# Patient Record
Sex: Male | Born: 1955 | Race: White | Hispanic: No | Smoking: Current every day smoker
Health system: Southern US, Community
[De-identification: ages and names within clinical notes are randomized; demographics above are authoritative.]

## PROBLEM LIST (undated history)

## (undated) DIAGNOSIS — M545 Low back pain, unspecified: Secondary | ICD-10-CM

## (undated) DIAGNOSIS — I10 Essential (primary) hypertension: Secondary | ICD-10-CM

## (undated) DIAGNOSIS — D126 Benign neoplasm of colon, unspecified: Secondary | ICD-10-CM

## (undated) DIAGNOSIS — I219 Acute myocardial infarction, unspecified: Secondary | ICD-10-CM

## (undated) DIAGNOSIS — Z720 Tobacco use: Secondary | ICD-10-CM

## (undated) DIAGNOSIS — D649 Anemia, unspecified: Secondary | ICD-10-CM

## (undated) DIAGNOSIS — E785 Hyperlipidemia, unspecified: Secondary | ICD-10-CM

## (undated) DIAGNOSIS — I519 Heart disease, unspecified: Secondary | ICD-10-CM

## (undated) DIAGNOSIS — M1990 Unspecified osteoarthritis, unspecified site: Secondary | ICD-10-CM

## (undated) DIAGNOSIS — I2699 Other pulmonary embolism without acute cor pulmonale: Secondary | ICD-10-CM

## (undated) DIAGNOSIS — E669 Obesity, unspecified: Secondary | ICD-10-CM

## (undated) DIAGNOSIS — I2510 Atherosclerotic heart disease of native coronary artery without angina pectoris: Secondary | ICD-10-CM

## (undated) DIAGNOSIS — I639 Cerebral infarction, unspecified: Secondary | ICD-10-CM

## (undated) HISTORY — PX: CORONARY STENT PLACEMENT: SHX1402

## (undated) HISTORY — DX: Atherosclerotic heart disease of native coronary artery without angina pectoris: I25.10

## (undated) HISTORY — DX: Hyperlipidemia, unspecified: E78.5

## (undated) HISTORY — DX: Other pulmonary embolism without acute cor pulmonale: I26.99

## (undated) HISTORY — DX: Anemia, unspecified: D64.9

## (undated) HISTORY — PX: CHOLECYSTECTOMY: SHX55

## (undated) HISTORY — DX: Acute myocardial infarction, unspecified: I21.9

## (undated) HISTORY — DX: Heart disease, unspecified: I51.9

## (undated) HISTORY — DX: Low back pain: M54.5

## (undated) HISTORY — PX: HERNIA REPAIR: SHX51

## (undated) HISTORY — DX: Essential (primary) hypertension: I10

## (undated) HISTORY — PX: TONSILLECTOMY: SUR1361

## (undated) HISTORY — DX: Obesity, unspecified: E66.9

## (undated) HISTORY — PX: OTHER SURGICAL HISTORY: SHX169

## (undated) HISTORY — DX: Unspecified osteoarthritis, unspecified site: M19.90

## (undated) HISTORY — PX: GASTRIC BYPASS: SHX52

## (undated) HISTORY — DX: Cerebral infarction, unspecified: I63.9

## (undated) HISTORY — DX: Low back pain, unspecified: M54.50

## (undated) HISTORY — PX: CORONARY ARTERY BYPASS GRAFT: SHX141

## (undated) HISTORY — DX: Tobacco use: Z72.0

---
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
## 1999-09-11 ENCOUNTER — Inpatient Hospital Stay (HOSPITAL_COMMUNITY): Admission: EM | Admit: 1999-09-11 | Discharge: 1999-09-16 | Payer: Self-pay | Admitting: Cardiology

## 1999-10-28 ENCOUNTER — Ambulatory Visit (HOSPITAL_COMMUNITY): Admission: RE | Admit: 1999-10-28 | Discharge: 1999-10-28 | Payer: Self-pay | Admitting: Internal Medicine

## 2000-12-18 ENCOUNTER — Inpatient Hospital Stay (HOSPITAL_COMMUNITY): Admission: AD | Admit: 2000-12-18 | Discharge: 2000-12-21 | Payer: Self-pay | Admitting: Cardiology

## 2004-02-21 ENCOUNTER — Inpatient Hospital Stay (HOSPITAL_COMMUNITY): Admission: EM | Admit: 2004-02-21 | Discharge: 2004-03-08 | Payer: Self-pay | Admitting: Cardiology

## 2004-07-13 ENCOUNTER — Ambulatory Visit: Payer: Self-pay | Admitting: Cardiology

## 2004-07-27 ENCOUNTER — Ambulatory Visit: Payer: Self-pay | Admitting: Cardiology

## 2004-10-27 ENCOUNTER — Ambulatory Visit: Payer: Self-pay | Admitting: Cardiology

## 2005-02-17 ENCOUNTER — Ambulatory Visit: Payer: Self-pay | Admitting: Cardiology

## 2005-02-18 ENCOUNTER — Ambulatory Visit: Payer: Self-pay | Admitting: Internal Medicine

## 2006-12-11 ENCOUNTER — Ambulatory Visit: Payer: Self-pay | Admitting: Internal Medicine

## 2006-12-11 DIAGNOSIS — M545 Low back pain, unspecified: Secondary | ICD-10-CM | POA: Insufficient documentation

## 2006-12-11 DIAGNOSIS — E785 Hyperlipidemia, unspecified: Secondary | ICD-10-CM | POA: Insufficient documentation

## 2006-12-11 DIAGNOSIS — D509 Iron deficiency anemia, unspecified: Secondary | ICD-10-CM | POA: Insufficient documentation

## 2006-12-11 DIAGNOSIS — E119 Type 2 diabetes mellitus without complications: Secondary | ICD-10-CM | POA: Insufficient documentation

## 2006-12-11 DIAGNOSIS — I10 Essential (primary) hypertension: Secondary | ICD-10-CM | POA: Insufficient documentation

## 2006-12-11 DIAGNOSIS — I252 Old myocardial infarction: Secondary | ICD-10-CM | POA: Insufficient documentation

## 2006-12-11 DIAGNOSIS — Z86718 Personal history of other venous thrombosis and embolism: Secondary | ICD-10-CM | POA: Insufficient documentation

## 2006-12-11 DIAGNOSIS — F17200 Nicotine dependence, unspecified, uncomplicated: Secondary | ICD-10-CM | POA: Insufficient documentation

## 2006-12-11 DIAGNOSIS — I2589 Other forms of chronic ischemic heart disease: Secondary | ICD-10-CM | POA: Insufficient documentation

## 2006-12-14 ENCOUNTER — Encounter (INDEPENDENT_AMBULATORY_CARE_PROVIDER_SITE_OTHER): Payer: Self-pay | Admitting: Internal Medicine

## 2008-03-31 ENCOUNTER — Encounter: Payer: Self-pay | Admitting: Cardiovascular Disease

## 2008-05-07 ENCOUNTER — Observation Stay (HOSPITAL_COMMUNITY): Admission: EM | Admit: 2008-05-07 | Discharge: 2008-05-10 | Payer: Self-pay | Admitting: Emergency Medicine

## 2008-05-08 ENCOUNTER — Ambulatory Visit: Payer: Self-pay | Admitting: Internal Medicine

## 2008-07-03 ENCOUNTER — Encounter: Payer: Self-pay | Admitting: Cardiovascular Disease

## 2009-03-18 ENCOUNTER — Encounter: Payer: Self-pay | Admitting: Cardiovascular Disease

## 2009-05-20 ENCOUNTER — Ambulatory Visit: Payer: Self-pay | Admitting: Cardiovascular Disease

## 2009-05-20 DIAGNOSIS — I25810 Atherosclerosis of coronary artery bypass graft(s) without angina pectoris: Secondary | ICD-10-CM | POA: Insufficient documentation

## 2009-06-02 ENCOUNTER — Telehealth: Payer: Self-pay | Admitting: Cardiovascular Disease

## 2009-06-02 ENCOUNTER — Encounter: Payer: Self-pay | Admitting: Cardiovascular Disease

## 2009-06-19 ENCOUNTER — Inpatient Hospital Stay (HOSPITAL_COMMUNITY): Admission: RE | Admit: 2009-06-19 | Discharge: 2009-06-19 | Payer: Self-pay | Admitting: Neurosurgery

## 2009-08-20 ENCOUNTER — Telehealth (INDEPENDENT_AMBULATORY_CARE_PROVIDER_SITE_OTHER): Payer: Self-pay | Admitting: *Deleted

## 2009-09-03 ENCOUNTER — Encounter: Payer: Self-pay | Admitting: Family Medicine

## 2009-09-03 ENCOUNTER — Ambulatory Visit: Payer: Self-pay | Admitting: Family Medicine

## 2009-09-03 DIAGNOSIS — E559 Vitamin D deficiency, unspecified: Secondary | ICD-10-CM | POA: Insufficient documentation

## 2009-09-04 LAB — CONVERTED CEMR LAB
AST: 21 units/L (ref 0–37)
Alkaline Phosphatase: 51 units/L (ref 39–117)
BUN: 14 mg/dL (ref 6–23)
Bilirubin, Direct: 0 mg/dL (ref 0.0–0.3)
CO2: 30 meq/L (ref 19–32)
Calcium: 9.4 mg/dL (ref 8.4–10.5)
Chloride: 105 meq/L (ref 96–112)
Creatinine, Ser: 1 mg/dL (ref 0.4–1.5)
Glucose, Bld: 101 mg/dL — ABNORMAL HIGH (ref 70–99)
LDL Cholesterol: 83 mg/dL (ref 0–99)
Total CHOL/HDL Ratio: 3
Vit D, 25-Hydroxy: 48 ng/mL (ref 30–89)

## 2009-10-05 ENCOUNTER — Ambulatory Visit: Payer: Self-pay | Admitting: Family Medicine

## 2009-10-05 LAB — CONVERTED CEMR LAB: PSA: 0.51 ng/mL (ref 0.10–4.00)

## 2009-10-12 ENCOUNTER — Telehealth: Payer: Self-pay | Admitting: Family Medicine

## 2010-02-04 ENCOUNTER — Ambulatory Visit: Payer: Self-pay | Admitting: Family Medicine

## 2010-05-04 ENCOUNTER — Ambulatory Visit: Payer: Self-pay | Admitting: Family Medicine

## 2010-05-04 DIAGNOSIS — F4322 Adjustment disorder with anxiety: Secondary | ICD-10-CM | POA: Insufficient documentation

## 2010-07-12 ENCOUNTER — Emergency Department (HOSPITAL_COMMUNITY): Admission: EM | Admit: 2010-07-12 | Discharge: 2010-07-12 | Payer: Self-pay | Admitting: Emergency Medicine

## 2010-07-13 ENCOUNTER — Emergency Department (HOSPITAL_COMMUNITY)
Admission: EM | Admit: 2010-07-13 | Discharge: 2010-07-13 | Payer: Self-pay | Source: Home / Self Care | Admitting: Emergency Medicine

## 2010-07-14 ENCOUNTER — Telehealth: Payer: Self-pay | Admitting: Family Medicine

## 2010-09-05 ENCOUNTER — Encounter: Payer: Self-pay | Admitting: Internal Medicine

## 2010-09-14 NOTE — Progress Notes (Signed)
  Phone Note From Other Clinic Call back at [phone]   Caller: [facility] Call For: [name] Summary of Call: Patient had a new patient appt. w/ you in October.Pt.had to cancel the appt.because it conflicted w/ him having surgery.  He was referred to you by Dr.Cooper.  Your next available new medicare appt. is in July.  [facility] @ Cardiology called to see if you would see pt sooner than July. Initial call taken by: Beau Fanny,  August 20, 2009 11:46 AM  Follow-up for Phone Call        Yes, please make room for him. Thanks. Follow-up by: Ruthe Mannan MD,  August 20, 2009 11:52 AM  Additional Follow-up for Phone Call Additional follow up Details #1::        Pt. scheduled for new pt appt. on 09/03/09 @ 2:00.  I notified [facility] @ Cardiology. Additional Follow-up by: Beau Fanny,  August 20, 2009 2:18 PM

## 2010-09-14 NOTE — Assessment & Plan Note (Signed)
Summary: 3 M F/U DLO   Vital Signs:  Patient profile:   55 year old male Height:      72 inches Weight:      190 pounds BMI:     25.86 Temp:     98.2 degrees F oral Pulse rate:   60 / minute Pulse rhythm:   regular BP sitting:   90 / 60  (right arm) Cuff size:   regular  Vitals Entered By: Linde Gillis CMA Duncan Dull) (May 04, 2010 9:46 AM) CC: three month follow up   History of Present Illness: 55 yo here for 3 month follow up.  Has been doing ok.  Having more anxiety because his daughter's heart condition is getting worse.  Now seeing a cardiologist at [facility].  Not having panic attacks, but feels himself tense up.  Only taking Alprazolam 1 mg at bedtime.  Denies any symptoms of depression.     DM- has been diet controlled since he gastric bypass in 2006 (lost 200 lb).  has lost a few more pounds in last 3 months. Denies any symptoms of hyper or hypo glycemia.    HLD-  On Zocor 20 mg daily.  Never had issues with myalgias.  Well controlled. FLP in 08/2009- HDL 44, LDL 83, TG 74.       Current Medications (verified): 1)  Zocor 20 Mg Tabs (Simvastatin) .Marland Kitchen.. 1 By Mouth Once Daily 2)  Metoprolol Succinate 25 Mg Tb24 (Metoprolol Succinate) .... 1/2 By Mouth Two Times A Day 3)  Lortab 10 10-500 Mg Tabs (Hydrocodone-Acetaminophen) .Marland Kitchen.. 1 By Mouth Four Times A Day As Needed 4)  Alprazolam 1 Mg Tabs (Alprazolam) .Marland Kitchen.. 1 By Mouth Three Times A Day As Needed Anxiety. 5)  Aspirin 81 Mg Tbec (Aspirin) .Marland Kitchen.. 1 By Mouth Once Daily 6)  Multivitamins  Tabs (Multiple Vitamin) .Marland Kitchen.. 1 By Mouth Two Times A Day 7)  Calcium Citrate + D  Tabs (Calcium Citrate-Vitamin D Tabs) .Marland Kitchen.. 1 By Mouth Two Times A Day 8)  Colace 100 Mg Caps (Docusate Sodium) .... As Needed 9)  Vitamin D3 2000 Unit Caps (Cholecalciferol) .Marland Kitchen.. 1 By Mouth Once Daily 10)  Nitrostat 0.3 Mg Subl (Nitroglycerin) .Marland Kitchen.. 1 Tab Every 5 Minutes As Needed For Chest Pain, Max of 3 Doses in 15 Min.  Allergies: 1)  Codeine  Past  History:  Past Medical History: Last updated: Jan 09, 2007 Diabetes mellitus, type II Hyperlipidemia Hypertension Low back pain--DDD Myocardial infarction, hx of-- first at age 11  arthritis nonsustained VT-2001 tobacco abuse left ventricular dysfunction-moderate-EF 44% obesity, hx (weighed 536 at heaviest) Coronary artery disease Pulmonary embolism, hx of Anemia-iron deficiency  Past Surgical History: Last updated: 05/20/2009 stent to the diagonal([place]) Non-ST/stent to the right coronary artery-2001 Stent/thrombectomy of right coronary artery x2-12/2000 Percutaneous transluminal coronary angioplasty of totally occluded right coronary artery with thrombus-2005 gastric bypass 2006 Coronary artery bypass graft--4 vessel  2005 gall bladder surgery hernia repair 2010  Family History: Last updated: 01/09/2007 father-deceased-56-MI mother-deceased-51-emphysema brother-deceased- brother-52-heart brother-47-RA-hemophelia daughter-19-"three chambered heart"  Social History: Last updated: 09/03/2009 Current Smoker-30 years--now 1ppd, previously up to 4-5 ppd. Alcohol use-no Drug use-no Married  Risk Factors: Alcohol Use: 0 (05/20/2009) Caffeine Use: 10-12 cups a day (05/20/2009) Exercise: yes (05/20/2009)  Risk Factors: Smoking Status: quit (05/20/2009) Packs/Day: .5 to 1.0 pack a day (05/20/2009)  Review of Systems      See HPI General:  Denies malaise. Eyes:  Denies blurring. CV:  Denies chest pain or discomfort. Resp:  Denies shortness of  breath. Psych:  Denies panic attacks, sense of great danger, suicidal thoughts/plans, thoughts of violence, unusual visions or sounds, and thoughts /plans of harming others.  Physical Exam  General:  Well-developed,well-nourished,in no acute distress; alert,appropriate and cooperative throughout examination Lungs:  Normal respiratory effort, chest expands symmetrically. Lungs are clear to auscultation, no crackles or  wheezes. Heart:  Normal rate and regular rhythm. S1 and S2 normal without gallop, murmur, click, rub or other extra sounds. Psych:  Cognition and judgment appear intact. Alert and cooperative with normal attention span and concentration. 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  Will increase his as needed Alprazolam dosing.  Discussed psychotherapy as well which he will consider.    Problem # 2:  HYPERTENSION (ICD-401.9) Assessment: Unchanged Stable on Metoprolol.   His updated medication list for this problem includes:    Metoprolol Succinate 25 Mg Tb24 (Metoprolol succinate) .Marland Kitchen... 1/2 by mouth two times a day  Problem # 3:  HYPERLIPIDEMIA (ICD-272.4) Assessment: Unchanged Stable on Zocor 20 mg daily. His updated medication list for this problem includes:    Zocor 20 Mg Tabs (Simvastatin) .Marland Kitchen... 1 by mouth once daily  Complete Medication List: 1)  Zocor 20 Mg Tabs (Simvastatin) .Marland Kitchen.. 1 by mouth once daily 2)  Metoprolol Succinate 25 Mg Tb24 (Metoprolol succinate) .... 1/2 by mouth two times a day 3)  Lortab 10 10-500 Mg Tabs (Hydrocodone-acetaminophen) .Marland Kitchen.. 1 by mouth four times a day as needed 4)  Alprazolam 1 Mg Tabs (Alprazolam) .Marland Kitchen.. 1 by mouth three times a day as needed anxiety. 5)  Aspirin 81 Mg Tbec (Aspirin) .Marland Kitchen.. 1 by mouth once daily 6)  Multivitamins Tabs (Multiple vitamin) .Marland Kitchen.. 1 by mouth two times a day 7)  Calcium Citrate + D Tabs (Calcium citrate-vitamin d tabs) .Marland Kitchen.. 1 by mouth two times a day 8)  Colace 100 Mg Caps (Docusate sodium) .... As needed 9)  Vitamin D3 2000 Unit Caps (Cholecalciferol) .Marland Kitchen.. 1 by mouth once daily 10)  Nitrostat 0.3 Mg Subl (Nitroglycerin) .Marland Kitchen.. 1 tab every 5 minutes as needed for chest pain, max of 3 doses in 15  min. Prescriptions: ALPRAZOLAM 1 MG TABS (ALPRAZOLAM) 1 by mouth three times a day as needed anxiety.  #90 x 0   Entered and Authorized by:   Ruthe Mannan MD   Signed by:   Ruthe Mannan MD on 05/04/2010   Method used:   Print then Give to Patient   RxID:   [id number] NITROSTAT 0.3 MG SUBL (NITROGLYCERIN) 1 tab every 5 minutes as needed for chest pain, max of 3 doses in 15 min.  #30 x 0   Entered and Authorized by:   Ruthe Mannan MD   Signed by:   Ruthe Mannan MD on 05/04/2010   Method used:   Electronically to        [facility] (retail)       [address]       [place], [place]  [address]       Ph: [phone]       Fax: [phone]   RxID:   [id number] LORTAB 10 10-500 MG TABS (HYDROCODONE-ACETAMINOPHEN) 1 by mouth four times a day as needed  #120 x 3   Entered and Authorized by:  Ruthe Mannan MD   Signed by:   Ruthe Mannan MD on 05/04/2010   Method used:   Print then Give to Patient   RxID:   [id number] ALPRAZOLAM 1 MG TABS (ALPRAZOLAM) 1 by mouth at bedtime  #30 x 3   Entered and Authorized by:   Ruthe Mannan MD   Signed by:   Ruthe Mannan MD on 05/04/2010   Method used:   Print then Give to Patient   RxID:   [id number]   Current Allergies (reviewed today): CODEINE

## 2010-09-14 NOTE — Assessment & Plan Note (Signed)
Summary: NEW PT TO EST/CLE   Vital Signs:  Patient profile:   55 year old male Height:      72 inches Weight:      194.50 pounds BMI:     26.47 Temp:     98.5 degrees F oral Pulse rate:   84 / minute Pulse rhythm:   regular BP sitting:   124 / 64  (left arm) Cuff size:   regular  Vitals Entered By: Delilah Shan CMA Duncan Dull) (September 03, 2009 1:58 PM) CC: New Patient to Establish   History of Present Illness: 55 yo here to establish care, Pt of Dr. Excell Seltzer.  1.  Cervical radiculopathy s/p recent C7 surgery by Dr. Venetia Maxon.  Doing well.  Says his strength returned in his right arm almost immediately.  Has f/u appt with him next week.  2.  DM- has been diet controlled since he gastric bypass in 2006 (lost 200 lb). Denies any symptoms of hyper or hypo glycemia.  3.  HLD- per pt, has not had lipids checked in almost a year.  On Zocor 20 mg daily.  Never had issues with myalgias.  4.  CAD s/p CABG in 2005- followed by Dr. Excell Seltzer.  Doing well.  Reports that his heart is no longer what keeps him from exertion.  Can walk for 45 minutes but has to stop due to lower and upper back pain.  Denies any SOB, CP, orthopnea, LE edema, presycope or syncope.  5.  Chronic lumbar disc disease- on disability mainly due to this issue (as well as CAD). On narcotics daily, but no signs of abuse per prior records.    6.  Vit D deficiency- likely secondary to malabsorption s/p Gastric Bypass.  Will recheck Vit D.  Has been taking 2000 units daily.  7.  Well man- needs colonoscopy.  Sexually active with wife only.  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    Current Medications (verified): 1)  Zocor 20 Mg Tabs (Simvastatin) .Marland Kitchen.. 1 By Mouth Once Daily 2)  Metoprolol Succinate 25 Mg Tb24 (Metoprolol Succinate) .... 1/2 By Mouth Two Times A Day 3)  Lortab 10 10-500 Mg Tabs (Hydrocodone-Acetaminophen) .Marland Kitchen.. 1 By Mouth Four Times A Day As  Needed 4)  Alprazolam 1 Mg Tabs (Alprazolam) .Marland Kitchen.. 1 By Mouth At Bedtime 5)  Aspirin 81 Mg Tbec (Aspirin) .Marland Kitchen.. 1 By Mouth Once Daily 6)  Multivitamins  Tabs (Multiple Vitamin) .Marland Kitchen.. 1 By Mouth Two Times A Day 7)  Calcium Citrate + D  Tabs (Calcium Citrate-Vitamin D Tabs) .Marland Kitchen.. 1 By Mouth Two Times A Day 8)  Colace 100 Mg Caps (Docusate Sodium) .... As Needed 9)  Vitamin D3 2000 Unit Caps (Cholecalciferol) .Marland Kitchen.. 1 By Mouth Once Daily  Allergies: 1)  Codeine  Past History:  Past Medical History: Last updated: 12/11/2006 Diabetes mellitus, type II Hyperlipidemia Hypertension Low back pain--DDD Myocardial infarction, hx of-- first at age 2  arthritis nonsustained VT-2001 tobacco abuse left ventricular dysfunction-moderate-EF 44% obesity, hx (weighed 536 at heaviest) Coronary artery disease Pulmonary embolism, hx of Anemia-iron deficiency  Past Surgical History: Last updated: 05/20/2009 stent to the diagonal([place]) Non-ST/stent to the right coronary artery-2001 Stent/thrombectomy of right coronary artery x2-12/2000 Percutaneous transluminal coronary angioplasty of totally occluded right coronary artery with thrombus-2005 gastric bypass 2006 Coronary artery bypass graft--4 vessel  2005 gall bladder surgery hernia repair 2010  Family History: Last updated: 12/11/2006  father-deceased-56-MI mother-deceased-51-emphysema brother-deceased- brother-52-heart brother-47-RA-hemophelia daughter-19-"three chambered heart"  Social History: Current Smoker-30 years--now 1ppd, previously up to 4-5 ppd. Alcohol use-no Drug use-no Married  Review of Systems      See HPI General:  Denies fatigue and weakness. Eyes:  Denies blurring. ENT:  Denies difficulty swallowing. CV:  Denies chest pain or discomfort, difficulty breathing at night, difficulty breathing while lying down, fainting, fatigue, leg cramps with exertion, lightheadness, near fainting, palpitations, shortness of breath  with exertion, swelling of feet, and swelling of hands. Resp:  Denies cough and shortness of breath. GI:  Denies abdominal pain, bloody stools, and change in bowel habits. GU:  Denies incontinence. MS:  Complains of low back pain; denies muscle weakness. Derm:  Denies rash. Neuro:  Denies visual disturbances and weakness. Psych:  Denies anxiety and depression. Endo:  Denies excessive thirst and excessive urination. Heme:  Denies abnormal bruising and bleeding.  Physical Exam  General:  Well-developed,well-nourished,in no acute distress; alert,appropriate and cooperative throughout examination Eyes:  No corneal or conjunctival inflammation noted. EOMI. Perrla. Funduscopic exam benign, without hemorrhages, exudates or papilledema. Vision grossly normal. Mouth:  Oral mucosa and oropharynx without lesions or exudates.  Teeth in good repair. Lungs:  Normal respiratory effort, chest expands symmetrically. Lungs are clear to auscultation, no crackles or wheezes. Heart:  Normal rate and regular rhythm. S1 and S2 normal without gallop, murmur, click, rub or other extra sounds. Abdomen:  Bowel sounds positive,abdomen soft and non-tender without masses, organomegaly or hernias noted. Msk:  laceration on second digit of right hand, no signs of infection, appears to be healing well Extremities:  no edema Skin:  Intact without suspicious lesions or rashes Psych:  Cognition and judgment appear intact. Alert and cooperative with normal attention span and concentration. No apparent delusions, illusions, hallucinations   Impression & Recommendations:  Problem # 1:  VITAMIN D DEFICIENCY (ICD-268.9) Assessment Unchanged Check Vit D today.  Continue daily supplementation. Orders: Venipuncture ([address]) T-Vitamin D (25-Hydroxy) [phone])  Problem # 2:  SPECIAL SCREENING FOR MALIGNANT NEOPLASMS COLON (ICD-V76.51) Assessment: Comment Only set up colonoscopy today. Orders: Gastroenterology Referral  (GI)  Problem # 3:  CAD, ARTERY BYPASS GRAFT (ICD-414.04) Assessment: Unchanged Appears to be doing well.  Continue meds per cards. His updated medication list for this problem includes:    Metoprolol Succinate 25 Mg Tb24 (Metoprolol succinate) .Marland Kitchen... 1/2 by mouth two times a day    Aspirin 81 Mg Tbec (Aspirin) .Marland Kitchen... 1 by mouth once daily  Problem # 4:  LOW BACK PAIN (ICD-724.2) Assessment: Unchanged Comfortable prescribing narcotics as no signs of abuse.  Will have him sign contract. His updated medication list for this problem includes:    Lortab 10 10-500 Mg Tabs (Hydrocodone-acetaminophen) .Marland Kitchen... 1 by mouth four times a day as needed    Aspirin 81 Mg Tbec (Aspirin) .Marland Kitchen... 1 by mouth once daily  Problem # 5:  HYPERTENSION (ICD-401.9) Assessment: Unchanged Stable.  Continue current meds. His updated medication list for this problem includes:    Metoprolol Succinate 25 Mg Tb24 (Metoprolol succinate) .Marland Kitchen... 1/2 by mouth two times a day  Orders: Venipuncture (91478) TLB-BMP (Basic Metabolic Panel-BMET) (80048-METABOL)  Problem # 6:  HYPERLIPIDEMIA (ICD-272.4) Assessment: Unchanged Check FLP, hepatic panel today.  Continue Zocor 20 mg daily. His updated medication list for this problem includes:    Zocor 20 Mg Tabs (Simvastatin) .Marland Kitchen... 1 by mouth once daily  Orders: Venipuncture (29562) TLB-Hepatic/Liver Function Pnl (80076-HEPATIC) TLB-Lipid Panel (80061-LIPID)  Problem # 7:  DIABETES  MELLITUS, TYPE II (ICD-250.00) Assessment: Unchanged Diet controlled s/p GBP.  BMET and a1c today. His updated medication list for this problem includes:    Aspirin 81 Mg Tbec (Aspirin) .Marland Kitchen... 1 by mouth once daily  Complete Medication List: 1)  Zocor 20 Mg Tabs (Simvastatin) .Marland Kitchen.. 1 by mouth once daily 2)  Metoprolol Succinate 25 Mg Tb24 (Metoprolol succinate) .... 1/2 by mouth two times a day 3)  Lortab 10 10-500 Mg Tabs (Hydrocodone-acetaminophen) .Marland Kitchen.. 1 by mouth four times a day as needed 4)   Alprazolam 1 Mg Tabs (Alprazolam) .Marland Kitchen.. 1 by mouth at bedtime 5)  Aspirin 81 Mg Tbec (Aspirin) .Marland Kitchen.. 1 by mouth once daily 6)  Multivitamins Tabs (Multiple vitamin) .Marland Kitchen.. 1 by mouth two times a day 7)  Calcium Citrate + D Tabs (Calcium citrate-vitamin d tabs) .Marland Kitchen.. 1 by mouth two times a day 8)  Colace 100 Mg Caps (Docusate sodium) .... As needed 9)  Vitamin D3 2000 Unit Caps (Cholecalciferol) .Marland Kitchen.. 1 by mouth once daily  Patient Instructions: 1)  It was really nice to meet you. 2)  Please stop by to see Shirlee Limerick on your way out.  She will set up your colonoscopy. 3)  I will be in touch tomorrow or Monday with your test results. 4)  Come back to see me at your convenience for your complete physicial. Prescriptions: ALPRAZOLAM 1 MG TABS (ALPRAZOLAM) 1 by mouth at bedtime  #30 x 0   Entered and Authorized by:   Ruthe Mannan MD   Signed by:   Ruthe Mannan MD on 09/03/2009   Method used:   Print then Give to Patient   RxID:   [id number]   Current Allergies (reviewed today): CODEINE  Appended Document: NEW PT TO EST/CLE

## 2010-09-14 NOTE — Progress Notes (Signed)
Summary: refill request for alprazolam  Phone Note Refill Request Message from:  Fax from Pharmacy  Refills Requested: Medication #1:  ALPRAZOLAM 1 MG TABS 1 by mouth three times a day as needed anxiety.   Last Refilled: 06/02/2010 Faxed request from [facility], [phone].  Initial call taken by: Lowella Petties CMA, AAMA,  July 14, 2010 12:13 PM  Follow-up for Phone Call        Rx called to pharmacy Follow-up by: Linde Gillis CMA Duncan Dull),  July 14, 2010 12:39 PM    Prescriptions: ALPRAZOLAM 1 MG TABS (ALPRAZOLAM) 1 by mouth three times a day as needed anxiety.  #90 x 0   Entered and Authorized by:   Kerby Nora MD   Signed by:   Kerby Nora MD on 07/14/2010   Method used:   Telephoned to ...       [facility] (retail)       [address]       [place], [place]  [address]       Ph: [phone]       Fax: [phone]   RxID:   [id number]

## 2010-09-14 NOTE — Progress Notes (Signed)
Summary: Colon procedure/ appt cancelled-pt not able to afford  Phone Note From Other Clinic   Caller: Receptionist Summary of Call: Per [facility] -pt cancelled GI procedure, Can not afford right now...sign Initial call taken by: Daine Gip,  October 12, 2009 10:22 AM

## 2010-09-14 NOTE — Assessment & Plan Note (Signed)
Summary: FOLLOW UP / LFW R/S FROM 6/24   Vital Signs:  Patient profile:   55 year old male Height:      72 inches Weight:      188 pounds BMI:     25.59 Temp:     98.6 degrees F oral Pulse rate:   84 / minute Pulse rhythm:   regular BP sitting:   100 / 60  (left arm) Cuff size:   regular  Vitals Entered By: Benny Lennert CMA (AAMA) (February 04, 2010 8:00 AM)  History of Present Illness: 55 yo here for 3 month follow up.  In great spirits, got his new teeth and feels great!    Cervical radiculopathy s/p recent C7 surgery by Dr. Venetia Maxon.  Doing well.  Says his strength returned in his right arm almost immediately.  Had f/u appt since I last saw him, Dr. Venetia Maxon released him because he is doing so well.  Pain well controlled on Lortab.   DM- has been diet controlled since he gastric bypass in 2006 (lost 200 lb).  has lost a few more pounds in last 3 months. Denies any symptoms of hyper or hypo glycemia.    HLD-  On Zocor 20 mg daily.  Never had issues with myalgias.  Well controlled. FLP in 08/2009- HDL 44, LDL 83, TG 74.       Current Medications (verified): 1)  Zocor 20 Mg Tabs (Simvastatin) .Marland Kitchen.. 1 By Mouth Once Daily 2)  Metoprolol Succinate 25 Mg Tb24 (Metoprolol Succinate) .... 1/2 By Mouth Two Times A Day 3)  Lortab 10 10-500 Mg Tabs (Hydrocodone-Acetaminophen) .Marland Kitchen.. 1 By Mouth Four Times A Day As Needed 4)  Alprazolam 1 Mg Tabs (Alprazolam) .Marland Kitchen.. 1 By Mouth At Bedtime 5)  Aspirin 81 Mg Tbec (Aspirin) .Marland Kitchen.. 1 By Mouth Once Daily 6)  Multivitamins  Tabs (Multiple Vitamin) .Marland Kitchen.. 1 By Mouth Two Times A Day 7)  Calcium Citrate + D  Tabs (Calcium Citrate-Vitamin D Tabs) .Marland Kitchen.. 1 By Mouth Two Times A Day 8)  Colace 100 Mg Caps (Docusate Sodium) .... As Needed 9)  Vitamin D3 2000 Unit Caps (Cholecalciferol) .Marland Kitchen.. 1 By Mouth Once Daily  Allergies: 1)  Codeine  Past History:  Past Medical History: Last updated: [birth year]-May[birth year]2008 Diabetes mellitus, type  II Hyperlipidemia Hypertension Low back pain--DDD Myocardial infarction, hx of-- first at age 56  arthritis nonsustained VT-2001 tobacco abuse left ventricular dysfunction-moderate-EF 44% obesity, hx (weighed 536 at heaviest) Coronary artery disease Pulmonary embolism, hx of Anemia-iron deficiency  Past Surgical History: Last updated: 05/20/2009 stent to the diagonal([place]) Non-ST/stent to the right coronary artery-2001 Stent/thrombectomy of right coronary artery x2-12/2000 Percutaneous transluminal coronary angioplasty of totally occluded right coronary artery with thrombus-2005 gastric bypass 2006 Coronary artery bypass graft--4 vessel  2005 gall bladder surgery hernia repair 2010  Family History: Last updated: 12-26-2006 father-deceased-56-MI mother-deceased-51-emphysema brother-deceased- brother-52-heart brother-47-RA-hemophelia daughter-19-"three chambered heart"  Social History: Last updated: 09/03/2009 Current Smoker-30 years--now 1ppd, previously up to 4-5 ppd. Alcohol use-no Drug use-no Married  Risk Factors: Alcohol Use: 0 (05/20/2009) Caffeine Use: 10-12 cups a day (05/20/2009) Exercise: yes (05/20/2009)  Risk Factors: Smoking Status: quit (05/20/2009) Packs/Day: .5 to 1.0 pack a day (05/20/2009)  Review of Systems      See HPI General:  Denies malaise. Eyes:  Denies blurring. ENT:  Denies difficulty swallowing. CV:  Denies chest pain or discomfort. Resp:  Denies shortness of breath.  Physical Exam  General:  Well-developed,well-nourished,in no acute distress; alert,appropriate and cooperative throughout  examination Mouth:  Oral mucosa and oropharynx without lesions or exudates.  Teeth in good repair. Lungs:  Normal respiratory effort, chest expands symmetrically. Lungs are clear to auscultation, no crackles or wheezes. Heart:  Normal rate and regular rhythm. S1 and S2 normal without gallop, murmur, click, rub or other extra  sounds. Extremities:  No clubbing, cyanosis, edema, or deformity noted with normal full range of motion of all joints.   Psych:  Cognition and judgment appear intact. Alert and cooperative with normal attention span and concentration. No apparent delusions, illusions, hallucinations   Impression & Recommendations:  Problem # 1:  HYPERTENSION (ICD-401.9) Assessment Unchanged Stable, continue current meds.  Advised him to call Dr. Earmon [place] office to make a routine follow up. His updated medication list for this problem includes:    Metoprolol Succinate 25 Mg Tb24 (Metoprolol succinate) .Marland Kitchen... 1/2 by mouth two times a day  Problem # 2:  HYPERLIPIDEMIA (ICD-272.4) Assessment: Unchanged Stable, continue Zocor 20 mg daily. His updated medication list for this problem includes:    Zocor 20 Mg Tabs (Simvastatin) .Marland Kitchen... 1 by mouth once daily  Problem # 3:  LOW BACK PAIN (ICD-724.2) Assessment: Unchanged Well controlled with current meds.  No signs of abuse, will have him sign controlled substance contract. His updated medication list for this problem includes:    Lortab 10 10-500 Mg Tabs (Hydrocodone-acetaminophen) .Marland Kitchen... 1 by mouth four times a day as needed    Aspirin 81 Mg Tbec (Aspirin) .Marland Kitchen... 1 by mouth once daily  Complete Medication List: 1)  Zocor 20 Mg Tabs (Simvastatin) .Marland Kitchen.. 1 by mouth once daily 2)  Metoprolol Succinate 25 Mg Tb24 (Metoprolol succinate) .... 1/2 by mouth two times a day 3)  Lortab 10 10-500 Mg Tabs (Hydrocodone-acetaminophen) .Marland Kitchen.. 1 by mouth four times a day as needed 4)  Alprazolam 1 Mg Tabs (Alprazolam) .Marland Kitchen.. 1 by mouth at bedtime 5)  Aspirin 81 Mg Tbec (Aspirin) .Marland Kitchen.. 1 by mouth once daily 6)  Multivitamins Tabs (Multiple vitamin) .Marland Kitchen.. 1 by mouth two times a day 7)  Calcium Citrate + D Tabs (Calcium citrate-vitamin d tabs) .Marland Kitchen.. 1 by mouth two times a day 8)  Colace 100 Mg Caps (Docusate sodium) .... As needed 9)  Vitamin D3 2000 Unit Caps (Cholecalciferol) .Marland Kitchen.. 1 by  mouth once daily Prescriptions: ALPRAZOLAM 1 MG TABS (ALPRAZOLAM) 1 by mouth at bedtime  #30 x 3   Entered and Authorized by:   Ruthe Mannan MD   Signed by:   Ruthe Mannan MD on 02/04/2010   Method used:   Print then Give to Patient   RxID:   [id number] LORTAB 10 10-500 MG TABS (HYDROCODONE-ACETAMINOPHEN) 1 by mouth four times a day as needed  #120 x 3   Entered and Authorized by:   Ruthe Mannan MD   Signed by:   Ruthe Mannan MD on 02/04/2010   Method used:   Print then Give to Patient   RxID:   [id number]   Current Allergies (reviewed today): CODEINE

## 2010-09-14 NOTE — Assessment & Plan Note (Signed)
Summary: CPX/RBH   Vital Signs:  Patient profile:   55 year old male Height:      72 inches Weight:      191 pounds BMI:     26.00 Temp:     98.1 degrees F oral Pulse rate:   84 / minute Pulse rhythm:   regular BP sitting:   104 / 60  (left arm) Cuff size:   regular  Vitals Entered By: Delilah Shan CMA [facility]) (October 05, 2009 9:25 AM) CC: CPX   History of Present Illness: 55 yo here for CPE with no complaints.    In great spirits, been going to dental clinic at [facility], they are working on getting him fit with new partials.     Cervical radiculopathy s/p recent C7 surgery by Dr. Venetia Maxon.  Doing well.  Says his strength returned in his right arm almost immediately.  Had f/u appt since I last saw him, Dr. Venetia Maxon released him because he is doing so well.  Pain well controlled on Lortab.   DM- has been diet controlled since he gastric bypass in 2006 (lost 200 lb). Denies any symptoms of hyper or hypo glycemia.    HLD-  On Zocor 20 mg daily.  Never had issues with myalgias.  Well controlled. FLP in 08/2009- HDL 44, LDL 83, TG 74.   CAD s/p CABG in 2005- followed by Dr. Excell Seltzer.  Doing well.  Reports that his heart is no longer what keeps him from exertion.  Can walk for 45 minutes but has to stop due to lower and upper back pain.  Denies any SOB, CP, orthopnea, LE edema, presycope or syncope.  Last appt with Dr. Excell Seltzer a few months ago.  Well man- needs colonoscopy but cannot afford copay until April.  Sexually active with wife only.  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   Needs PSA.  Denies any urinary symptoms.  Tobacca abuse- continuing to cut back, now down to 2-3 cigs/day.  Sometimes even forgets to smoke.    Current Medications (verified): 1)  Zocor 20 Mg Tabs (Simvastatin) .Marland Kitchen.. 1 By Mouth Once Daily 2)  Metoprolol Succinate 25 Mg Tb24 (Metoprolol Succinate) .... 1/2 By Mouth  Two Times A Day 3)  Lortab 10 10-500 Mg Tabs (Hydrocodone-Acetaminophen) .Marland Kitchen.. 1 By Mouth Four Times A Day As Needed 4)  Alprazolam 1 Mg Tabs (Alprazolam) .Marland Kitchen.. 1 By Mouth At Bedtime 5)  Aspirin 81 Mg Tbec (Aspirin) .Marland Kitchen.. 1 By Mouth Once Daily 6)  Multivitamins  Tabs (Multiple Vitamin) .Marland Kitchen.. 1 By Mouth Two Times A Day 7)  Calcium Citrate + D  Tabs (Calcium Citrate-Vitamin D Tabs) .Marland Kitchen.. 1 By Mouth Two Times A Day 8)  Colace 100 Mg Caps (Docusate Sodium) .... As Needed 9)  Vitamin D3 2000 Unit Caps (Cholecalciferol) .Marland Kitchen.. 1 By Mouth Once Daily  Allergies: 1)  Codeine  Past History:  Past Medical History: Last updated: 12/11/2006 Diabetes mellitus, type II Hyperlipidemia Hypertension Low back pain--DDD Myocardial infarction, hx of-- first at age 36  arthritis nonsustained VT-2001 tobacco abuse left ventricular dysfunction-moderate-EF 44% obesity, hx (weighed 536 at heaviest) Coronary artery disease Pulmonary embolism, hx of Anemia-iron deficiency  Past Surgical History: Last updated: 05/20/2009 stent to the diagonal([place]) Non-ST/stent to the right coronary artery-2001 Stent/thrombectomy of right coronary artery x2-12/2000 Percutaneous transluminal coronary angioplasty of totally occluded right coronary artery with thrombus-2005  gastric bypass 2006 Coronary artery bypass graft--4 vessel  2005 gall bladder surgery hernia repair 2010  Family History: Last updated: 2006-12-19 father-deceased-56-MI mother-deceased-51-emphysema brother-deceased- brother-52-heart brother-47-RA-hemophelia daughter-19-"three chambered heart"  Social History: Last updated: 09/03/2009 Current Smoker-30 years--now 1ppd, previously up to 4-5 ppd. Alcohol use-no Drug use-no Married  Risk Factors: Alcohol Use: 0 (05/20/2009) Caffeine Use: 10-12 cups a day (05/20/2009) Exercise: yes (05/20/2009)  Risk Factors: Smoking Status: quit (05/20/2009) Packs/Day: .5 to 1.0 pack a day  (05/20/2009)  Review of Systems      See HPI General:  Denies chills, fever, and loss of appetite. Eyes:  Denies blurring. ENT:  Denies difficulty swallowing. CV:  Denies chest pain or discomfort. Resp:  Denies shortness of breath. GI:  Denies abdominal pain, bloody stools, and change in bowel habits. MS:  Denies muscle weakness. Derm:  Denies rash. Neuro:  Denies headaches. Psych:  Denies depression.  Physical Exam  General:  Well-developed,well-nourished,in no acute distress; alert,appropriate and cooperative throughout examination Eyes:  No corneal or conjunctival inflammation noted. EOMI. Perrla. Funduscopic exam benign, without hemorrhages, exudates or papilledema. Vision grossly normal. Ears:  External ear exam shows no significant lesions or deformities.  Otoscopic examination reveals clear canals, tympanic membranes are intact bilaterally without bulging, retraction, inflammation or discharge. Hearing is grossly normal bilaterally. Mouth:  Oral mucosa and oropharynx without lesions or exudates.  Teeth in good repair. Lungs:  Normal respiratory effort, chest expands symmetrically. Lungs are clear to auscultation, no crackles or wheezes. Heart:  Normal rate and regular rhythm. S1 and S2 normal without gallop, murmur, click, rub or other extra sounds. Abdomen:  Bowel sounds positive,abdomen soft and non-tender without masses, organomegaly or hernias noted. Extremities:  No clubbing, cyanosis, edema, or deformity noted with normal full range of motion of all joints.   Skin:  Intact without suspicious lesions or rashes Psych:  Cognition and judgment appear intact. Alert and cooperative with normal attention span and concentration. No apparent delusions, illusions, hallucinations   Impression & Recommendations:  Problem # 1:  Preventive Health Care (ICD-V70.0) Reviewed preventive care protocols, scheduled due services, and updated immunizations Discussed nutrition, exercise, diet,  and healthy lifestyle.  PSA today.  He will set up colonscopy in April.  Problem # 2:  VITAMIN D DEFICIENCY (ICD-268.9) Assessment: Unchanged Doing well.  Continue Caltrate and Vit D.  Problem # 3:  LOW BACK PAIN (ICD-724.2) Assessment: Unchanged Refill narcotics today, he is appropriate and not showing any abuse or drug seeking behavior. His updated medication list for this problem includes:    Lortab 10 10-500 Mg Tabs (Hydrocodone-acetaminophen) .Marland Kitchen... 1 by mouth four times a day as needed    Aspirin 81 Mg Tbec (Aspirin) .Marland Kitchen... 1 by mouth once daily  Problem # 4:  HYPERLIPIDEMIA (ICD-272.4) Assessment: Unchanged Stable.  Continue Zocor, recheck in 1 year. His updated medication list for this problem includes:    Zocor 20 Mg Tabs (Simvastatin) .Marland Kitchen... 1 by mouth once daily  Problem # 5:  HYPERTENSION (ICD-401.9) Assessment: Unchanged Stable.  Continue low dose metoprolol. His updated medication list for this problem includes:    Metoprolol Succinate 25 Mg Tb24 (Metoprolol succinate) .Marland Kitchen... 1/2 by mouth two times a day  Complete Medication List: 1)  Zocor 20 Mg Tabs (Simvastatin) .Marland Kitchen.. 1 by mouth once daily 2)  Metoprolol Succinate 25 Mg Tb24 (Metoprolol succinate) .... 1/2 by mouth two times a day 3)  Lortab 10 10-500 Mg Tabs (Hydrocodone-acetaminophen) .Marland Kitchen.. 1 by mouth four times a day as needed 4)  Alprazolam 1 Mg Tabs (Alprazolam) .Marland Kitchen.. 1 by mouth at bedtime 5)  Aspirin 81 Mg Tbec (Aspirin) .Marland Kitchen.. 1 by mouth once daily 6)  Multivitamins Tabs (Multiple vitamin) .Marland Kitchen.. 1 by mouth two times a day 7)  Calcium Citrate + D Tabs (Calcium citrate-vitamin d tabs) .Marland Kitchen.. 1 by mouth two times a day 8)  Colace 100 Mg Caps (Docusate sodium) .... As needed 9)  Vitamin D3 2000 Unit Caps (Cholecalciferol) .Marland Kitchen.. 1 by mouth once daily  Other Orders: Venipuncture (62130) TLB-PSA (Prostate Specific Antigen) (84153-PSA) Prescriptions: ALPRAZOLAM 1 MG TABS (ALPRAZOLAM) 1 by mouth at bedtime  #30 x 3    Entered and Authorized by:   Ruthe Mannan MD   Signed by:   Ruthe Mannan MD on 10/05/2009   Method used:   Print then Give to Patient   RxID:   [id number] LORTAB 10 10-500 MG TABS (HYDROCODONE-ACETAMINOPHEN) 1 by mouth four times a day as needed  #120 x 3   Entered and Authorized by:   Ruthe Mannan MD   Signed by:   Ruthe Mannan MD on 10/05/2009   Method used:   Print then Give to Patient   RxID:   [id number]   Current Allergies (reviewed today): CODEINE  TD Result Date:  07/23/2008 TD Result:  historical TD Next Due:  10 yr

## 2010-09-23 ENCOUNTER — Telehealth: Payer: Self-pay | Admitting: Family Medicine

## 2010-09-30 NOTE — Progress Notes (Signed)
Summary: alprazolam  Phone Note Refill Request Message from:  Fax from Pharmacy on September 23, 2010 1:23 PM  Refills Requested: Medication #1:  ALPRAZOLAM 1 MG TABS 1 by mouth three times a day as needed anxiety.   Last Refilled: 07/14/2010 Refill request from [facility]. [phone]  Initial call taken by: Melody Comas,  September 23, 2010 1:24 PM  Follow-up for Phone Call        please call in.  Follow-up by: Crawford Givens MD,  September 23, 2010 2:13 PM  Additional Follow-up for Phone Call Additional follow up Details #1::        Rx called to pharmacy Additional Follow-up by: Linde Gillis CMA [facility]),  September 23, 2010 2:46 PM    Prescriptions: ALPRAZOLAM 1 MG TABS (ALPRAZOLAM) 1 by mouth three times a day as needed anxiety.  #90 x 0   Entered and Authorized by:   Crawford Givens MD   Signed by:   Crawford Givens MD on 09/23/2010   Method used:   Telephoned to ...       [facility] (retail)       [address]       [place], [place]  [address]       Ph: [phone]       Fax: [phone]   RxID:   [id number]

## 2010-10-06 ENCOUNTER — Encounter (INDEPENDENT_AMBULATORY_CARE_PROVIDER_SITE_OTHER): Payer: Medicare Other | Admitting: Family Medicine

## 2010-10-06 ENCOUNTER — Encounter: Payer: Self-pay | Admitting: Family Medicine

## 2010-10-06 ENCOUNTER — Other Ambulatory Visit: Payer: Self-pay | Admitting: Family Medicine

## 2010-10-06 DIAGNOSIS — E785 Hyperlipidemia, unspecified: Secondary | ICD-10-CM

## 2010-10-06 DIAGNOSIS — E119 Type 2 diabetes mellitus without complications: Secondary | ICD-10-CM

## 2010-10-06 DIAGNOSIS — Z125 Encounter for screening for malignant neoplasm of prostate: Secondary | ICD-10-CM

## 2010-10-06 DIAGNOSIS — Z0000 Encounter for general adult medical examination without abnormal findings: Secondary | ICD-10-CM

## 2010-10-06 LAB — LIPID PANEL
Cholesterol: 103 mg/dL (ref 0–200)
HDL: 36.3 mg/dL — ABNORMAL LOW (ref >39.00)
Total CHOL/HDL Ratio: 3
Triglycerides: 46 mg/dL (ref 0.0–149.0)

## 2010-10-06 LAB — BASIC METABOLIC PANEL
BUN: 18 mg/dL (ref 6–23)
CO2: 32 mEq/L (ref 19–32)
Chloride: 106 mEq/L (ref 96–112)
Creatinine, Ser: 1.1 mg/dL (ref 0.4–1.5)
Glucose, Bld: 95 mg/dL (ref 70–99)

## 2010-10-06 LAB — HEPATIC FUNCTION PANEL
ALT: 17 U/L (ref 0–53)
AST: 25 U/L (ref 0–37)
Albumin: 3.8 g/dL (ref 3.5–5.2)
Alkaline Phosphatase: 53 U/L (ref 39–117)

## 2010-10-06 LAB — PSA: PSA: 0.43 ng/mL (ref 0.10–4.00)

## 2010-10-12 ENCOUNTER — Encounter (INDEPENDENT_AMBULATORY_CARE_PROVIDER_SITE_OTHER): Payer: Self-pay | Admitting: *Deleted

## 2010-10-12 NOTE — Assessment & Plan Note (Signed)
Summary: CPX/CLE  PT REQUESTED LABS SAME DAY AS CPX  [employer]   Vital Signs:  Patient profile:   56 year old male Height:      72 inches Weight:      196.50 pounds BMI:     26.75 Temp:     98.5 degrees F oral Pulse rate:   80 / minute Pulse rhythm:   regular BP sitting:   112 / 70  (right arm) Cuff size:   regular  Vitals Entered By: Linde Gillis CMA Duncan Dull) (October 06, 2010 9:10 AM) CC: medicare CPX  Vision Screening:Left eye with correction: 20 / 20 Right eye with correction: 20 / 20 Both eyes with correction: 20 / 20  Color vision testing: normal      Vision Entered By: Linde Gillis CMA Duncan Dull) (October 06, 2010 9:29 AM)  Hearing Screen  20db HL: Left  Right  500 hz: 20db 1000 hz: 20db 2000 hz: 20db 4000 hz: 20db   Hearing Testing Entered By: Linde Gillis CMA Duncan Dull) (October 06, 2010 9:29 AM) 40db HL: Left  500 hz: 40db 1000 hz: No Response 2000 hz: No Response 4000 hz: No Response Right     History of Present Illness: 55 yo here for Medicare CPX.  I have personally reviewed the Medicare Annual Wellness questionnaire and have noted 1.   The patient's medical and social history 2.   Their use of alcohol, tobacco or illicit drugs 3.   Their current medications and supplements 4.   The patient's functional ability including ADL's, fall risks, home safety risks and hearing or visual impairment- see geriatrics and nursing assesment.  Decreased hearing in left ear, pt aware and does not want further work up.           .   Diet and physical activities- physicall active 6.   Evidence for depression or mood disorders- none      DM- has been diet controlled since he gastric bypass in 2006 (lost 200 lb).  has lost a few more pounds in last 3 months. Denies any symptoms of hyper or hypo glycemia.    HLD-  On Zocor 20 mg daily.  Never had issues with myalgias but would like to change to lipitor now that it;s generic.  .  Well controlled. FLP in 08/2009- HDL 44, LDL  83, TG 74.       Current Medications (verified): 1)  Metoprolol Succinate 25 Mg Tb24 (Metoprolol Succinate) .... 1/2 By Mouth Two Times A Day 2)  Lortab 10 10-500 Mg Tabs (Hydrocodone-Acetaminophen) .Marland Kitchen.. 1 By Mouth Four Times A Day As Needed 3)  Alprazolam 1 Mg Tabs (Alprazolam) .Marland Kitchen.. 1 By Mouth Three Times A Day As Needed Anxiety. 4)  Aspirin 81 Mg Tbec (Aspirin) .Marland Kitchen.. 1 By Mouth Once Daily 5)  Multivitamins  Tabs (Multiple Vitamin) .Marland Kitchen.. 1 By Mouth Two Times A Day 6)  Calcium Citrate + D  Tabs (Calcium Citrate-Vitamin D Tabs) .Marland Kitchen.. 1 By Mouth Two Times A Day 7)  Colace 100 Mg Caps (Docusate Sodium) .... As Needed 8)  Vitamin D3 2000 Unit Caps (Cholecalciferol) .Marland Kitchen.. 1 By Mouth Once Daily 9)  Nitrostat 0.3 Mg Subl (Nitroglycerin) .Marland Kitchen.. 1 Tab Every 5 Minutes As Needed For Chest Pain, Max of 3 Doses in 15 Min. 10)  Lipitor 40 Mg Tabs (Atorvastatin Calcium) .... Take 1 Tab By Mouth Daily  Allergies: 1)  Codeine  Past History:  Past Medical History: Last updated: 12/11/2006 Diabetes mellitus, type II Hyperlipidemia Hypertension  Low back pain--DDD Myocardial infarction, hx of-- first at age 27  arthritis nonsustained VT-2001 tobacco abuse left ventricular dysfunction-moderate-EF 44% obesity, hx (weighed 536 at heaviest) Coronary artery disease Pulmonary embolism, hx of Anemia-iron deficiency  Past Surgical History: Last updated: 05/20/2009 stent to the diagonal([place]) Non-ST/stent to the right coronary artery-2001 Stent/thrombectomy of right coronary artery x2-12/2000 Percutaneous transluminal coronary angioplasty of totally occluded right coronary artery with thrombus-2005 gastric bypass 2006 Coronary artery bypass graft--4 vessel  2005 gall bladder surgery hernia repair 2010  Family History: Last updated: 01-08-2007 father-deceased-56-MI mother-deceased-51-emphysema brother-deceased- brother-52-heart brother-47-RA-hemophelia daughter-19-"three chambered  heart"  Social History: Last updated: 09/03/2009 Current Smoker-30 years--now 1ppd, previously up to 4-5 ppd. Alcohol use-no Drug use-no Married  Risk Factors: Alcohol Use: 0 (05/20/2009) Caffeine Use: 10-12 cups a day (05/20/2009) Exercise: yes (05/20/2009)  Risk Factors: Smoking Status: quit (05/20/2009) Packs/Day: .5 to 1.0 pack a day (05/20/2009)  Review of Systems      See HPI General:  Denies malaise. Eyes:  Denies blurring. ENT:  Denies difficulty swallowing. CV:  Denies chest pain or discomfort. Resp:  Denies shortness of breath. GI:  Denies abdominal pain, bloody stools, and change in bowel habits. GU:  Denies incontinence, urinary frequency, and urinary hesitancy. MS:  Denies joint pain, joint redness, and joint swelling. Derm:  Denies rash. Neuro:  Denies headaches. Psych:  Denies anxiety and depression. Endo:  Denies cold intolerance and heat intolerance. Heme:  Denies abnormal bruising and bleeding.  Physical Exam  General:  Well-developed,well-nourished,in no acute distress; alert,appropriate and cooperative throughout examination Ears:  External ear exam shows no significant lesions or deformities.  Otoscopic examination reveals clear canals, tympanic membranes are intact bilaterally without bulging, retraction, inflammation or discharge. Hearing is grossly normal bilaterally. Nose:  no external deformity.   Mouth:  Oral mucosa and oropharynx without lesions or exudates.  Teeth in good repair. Lungs:  Normal respiratory effort, chest expands symmetrically. Lungs are clear to auscultation, no crackles or wheezes. Heart:  Normal rate and regular rhythm. S1 and S2 normal without gallop, murmur, click, rub or other extra sounds. Abdomen:  Bowel sounds positive,abdomen soft and non-tender without masses, organomegaly or hernias noted. Extremities:  no edema Neurologic:  alert & oriented X3 and gait normal.   Skin:  Intact without suspicious lesions or  rashes Psych:  Cognition and judgment appear intact. Alert and cooperative with normal attention span and concentration. No apparent delusions, illusions, hallucinations   Impression & Recommendations:  Problem # 1:  Preventive Health Care (ICD-V70.0) The patients weight, height, BMI and visual acuity have been recorded in the chart I have made referrals, counseling and provided education to the patient based review of the above and I have provided the pt with a written personalized care plan for preventive services.  Complete Medication List: 1)  Metoprolol Succinate 25 Mg Tb24 (Metoprolol succinate) .... 1/2 by mouth two times a day 2)  Lortab 10 10-500 Mg Tabs (Hydrocodone-acetaminophen) .Marland Kitchen.. 1 by mouth four times a day as needed 3)  Alprazolam 1 Mg Tabs (Alprazolam) .Marland Kitchen.. 1 by mouth three times a day as needed anxiety. 4)  Aspirin 81 Mg Tbec (Aspirin) .Marland Kitchen.. 1 by mouth once daily 5)  Multivitamins Tabs (Multiple vitamin) .Marland Kitchen.. 1 by mouth two times a day 6)  Calcium Citrate + D Tabs (Calcium citrate-vitamin d tabs) .Marland Kitchen.. 1 by mouth two times a day 7)  Colace 100 Mg Caps (Docusate sodium) .... As needed 8)  Vitamin D3 2000 Unit Caps (Cholecalciferol) .Marland KitchenMarland KitchenMarland Kitchen 1  by mouth once daily 9)  Nitrostat 0.3 Mg Subl (Nitroglycerin) .Marland Kitchen.. 1 tab every 5 minutes as needed for chest pain, max of 3 doses in 15 min. 10)  Lipitor 40 Mg Tabs (Atorvastatin calcium) .... Take 1 tab by mouth daily  Other Orders: Gastroenterology Referral (GI) Venipuncture ([address]) TLB-Lipid Panel (80061-LIPID) TLB-BMP (Basic Metabolic Panel-BMET) (80048-METABOL) TLB-A1C / Hgb A1C (Glycohemoglobin) (83036-A1C) TLB-PSA (Prostate Specific Antigen) (84153-PSA) TLB-Hepatic/Liver Function Pnl (80076-HEPATIC) Medicare -1st Annual Wellness Visit [phone])  Patient Instructions: 1)  please stop by to see Shirlee Limerick on your way out. 2)  I have provided you with a copy of your personalized plan for preventive services. Please take the time to  review along with your updated medication list. Prescriptions: LORTAB 10 10-500 MG TABS (HYDROCODONE-ACETAMINOPHEN) 1 by mouth four times a day as needed  #120 x 3   Entered and Authorized by:   Ruthe Mannan MD   Signed by:   Ruthe Mannan MD on 10/06/2010   Method used:   Print then Give to Patient   RxID:   [id number] ALPRAZOLAM 1 MG TABS (ALPRAZOLAM) 1 by mouth three times a day as needed anxiety.  #90 x 0   Entered and Authorized by:   Ruthe Mannan MD   Signed by:   Ruthe Mannan MD on 10/06/2010   Method used:   Print then Give to Patient   RxID:   [id number] LIPITOR 40 MG TABS (ATORVASTATIN CALCIUM) Take 1 tab by mouth daily  #90 x 3   Entered and Authorized by:   Ruthe Mannan MD   Signed by:   Ruthe Mannan MD on 10/06/2010   Method used:   Electronically to        [facility] (retail)       [address]       [place], [place]  [address]       Ph: [phone]       Fax: [phone]   RxID:   [id number]    Orders Added: 1)  Gastroenterology Referral [GI] 2)  Venipuncture [84166] 3)  TLB-Lipid Panel [80061-LIPID] 4)  TLB-BMP (Basic Metabolic Panel-BMET) [80048-METABOL] 5)  TLB-A1C / Hgb A1C (Glycohemoglobin) [83036-A1C] 6)  TLB-PSA (Prostate Specific Antigen) [84153-PSA] 7)  TLB-Hepatic/Liver Function Pnl [80076-HEPATIC] 8)  Medicare -1st Annual Wellness Visit [G0438]    Current Allergies (reviewed today): CODEINE  Last Flu Vaccine:  Historical (05/15/2006 9:27:42 AM) Flu Vaccine Next Due:  Not Indicated    Prevention & Chronic Care Immunizations   Influenza vaccine: Historical  (05/15/2006)   Influenza vaccine due: Not Indicated    Tetanus booster: 07/23/2008: historical   Tetanus booster due: 07/23/2018    Pneumococcal vaccine: Historical  (05/15/2004)   Pneumococcal vaccine due: None  Colorectal Screening   Hemoccult: Not documented   Hemoccult action/deferral: Ordered  (10/06/2010)    Colonoscopy: Not  documented   Colonoscopy action/deferral: GI referral  (10/06/2010)  Other Screening   PSA: 0.51  (10/05/2009)   PSA ordered.   PSA due due: 10/05/2010   Smoking status: quit  (05/20/2009)  Diabetes Mellitus   HgbA1C: Not documented    Eye exam: Not documented    Foot exam: Not documented   High risk foot: Not documented   Foot care education: Not documented    Urine microalbumin/creatinine ratio: Not documented  Lipids   Total Cholesterol: 142  (09/03/2009)   LDL: 83  (09/03/2009)   LDL Direct: Not documented  HDL: 44.60  (09/03/2009)   Triglycerides: 74.0  (09/03/2009)    SGOT (AST): 21  (09/03/2009)   SGPT (ALT): 16  (09/03/2009)   Alkaline phosphatase: 51  (09/03/2009)   Total bilirubin: 0.8  (09/03/2009)  Hypertension   Last Blood Pressure: 112 / 70  (10/06/2010)   Serum creatinine: 1.0  (09/03/2009)   Serum potassium 4.5  (09/03/2009)  Self-Management Support :    Diabetes self-management support: Not documented    Hypertension self-management support: Not documented    Lipid self-management support: Not documented    Nursing Instructions: Provide Hemoccult cards with instructions (see order) GI referral for screening colonoscopy (see order)    Geriatric Assessment:  Activities of Daily Living:    Bathing-independent    Dressing-independent    Eating-independent    Toileting-independent    Transferring-independent    Continence-independent  Instrumental Activities of Daily Living:    Transportation-independent    Meal/Food Preparation-independent    Shopping Errands-independent    Housekeeping/Chores-independent    Money Management/Finances-independent    Medication Management-independent    Ability to Use Telephone-independent    Laundry-independent

## 2010-10-12 NOTE — Letter (Signed)
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

## 2010-10-12 NOTE — Letter (Signed)
Summary: [facility]: Immunoassay Fecal Occult Blood (iFOB) Order Form  [place] [facility]  [address] [place], [place] [address]   Phone: [phone]  Fax: [phone]      [facility]: Immunoassay Fecal Occult Blood (iFOB) Order Form   October 06, 2010 MRN: [id number]   [name] 14-Aug-1956   Physicican Name:_________Talia Aron________________  Diagnosis Code:__________V76.51________________      Dennis Mannan MD

## 2010-10-13 ENCOUNTER — Other Ambulatory Visit: Payer: Self-pay | Admitting: Family Medicine

## 2010-10-13 ENCOUNTER — Other Ambulatory Visit: Payer: Medicare Other

## 2010-10-13 ENCOUNTER — Encounter (INDEPENDENT_AMBULATORY_CARE_PROVIDER_SITE_OTHER): Payer: Self-pay | Admitting: *Deleted

## 2010-10-13 DIAGNOSIS — Z1211 Encounter for screening for malignant neoplasm of colon: Secondary | ICD-10-CM

## 2010-10-14 ENCOUNTER — Encounter: Payer: Self-pay | Admitting: Family Medicine

## 2010-10-21 NOTE — Letter (Signed)
Summary: New Patient letter  [facility]  [address] [place], [place] [address]   Phone: [phone]  Fax: [phone]       10/12/2010 MRN: [id number]  [facility] [address] [place], [place]  [address]  [place]  Dear Mr. KAZMIERCZAK,  Welcome to the Gastroenterology Division at [facility].    You are scheduled to see Dr.  Jarold Motto on 12-07-10 at 8:30A.M. on the 3rd floor at [facility], [address]  We ask that you try to arrive at our office 15 minutes prior to your appointment time to allow for check-in.  We would like you to complete the enclosed self-administered evaluation form prior to your visit and bring it with you on the day of your appointment.  We will review it with you.  Also, please bring a complete list of all your medications or, if you prefer, bring the medication bottles and we will list them.  Please bring your insurance card so that we may make a copy of it.  If your insurance requires a referral to see a specialist, please bring your referral form from your primary care physician.  Co-payments are due at the time of your visit and may be paid by cash, check or credit card.     Your office visit will consist of a consult with your physician (includes a physical exam), any laboratory testing he/she may order, scheduling of any necessary diagnostic testing (e.g. x-ray, ultrasound, CT-scan), and scheduling of a procedure (e.g. Endoscopy, Colonoscopy) if required.  Please allow enough time on your schedule to allow for any/all of these possibilities.    If you cannot keep your appointment, please call [phone] to cancel or reschedule prior to your appointment date.  This allows [place] the opportunity to schedule an appointment for another patient in need of care.  If you do not cancel or reschedule by 5 p.m. the business day prior to your appointment date, you will be charged a $50.00 late cancellation/no-show fee.    Thank you for choosing  [facility] for your medical needs.  We appreciate the opportunity to care for you.  Please visit [place] at our website  to learn more about our practice.                     Sincerely,                                                             The Gastroenterology Division

## 2010-10-21 NOTE — Letter (Signed)
Summary: Results Follow up Letter  [place] at [facility]  [address] [place], [place] [address]   Phone: [phone]  Fax: [phone]    10/14/2010 MRN: [id number]  [facility] [name] [address] [place], [place]  [address]  [place]  Dear [name],  The following are the results of your recent test(s):  Test         Result    Pap Smear:        Normal _____  Not Normal _____ Comments: ______________________________________________________ Cholesterol: LDL(Bad cholesterol):         Your goal is less than:         HDL (Good cholesterol):       Your goal is more than: Comments:  ______________________________________________________ Mammogram:        Normal _____  Not Normal _____ Comments:  ___________________________________________________________________ Hemoccult:        Normal __X___  Not normal _______ Comments:    _____________________________________________________________________ Other Tests:    We routinely do not discuss normal results over the telephone.  If you desire a copy of the results, or you have any questions about this information we can discuss them at your next office visit.   Sincerely,     Dr. Ruthe Mannan

## 2010-10-26 LAB — DIFFERENTIAL
Basophils Relative: 1 % (ref 0–1)
Eosinophils Absolute: 0.1 10*3/uL (ref 0.0–0.7)
Monocytes Absolute: 0.5 10*3/uL (ref 0.1–1.0)
Monocytes Relative: 7 % (ref 3–12)
Neutrophils Relative %: 69 % (ref 43–77)

## 2010-10-26 LAB — CBC
HCT: 36.3 % — ABNORMAL LOW (ref 39.0–52.0)
Hemoglobin: 12.5 g/dL — ABNORMAL LOW (ref 13.0–17.0)
MCH: 32.1 pg (ref 26.0–34.0)
MCHC: 34.4 g/dL (ref 30.0–36.0)
RDW: 13 % (ref 11.5–15.5)

## 2010-10-27 LAB — POCT I-STAT, CHEM 8
Calcium, Ion: 1.01 mmol/L — ABNORMAL LOW (ref 1.12–1.32)
Glucose, Bld: 108 mg/dL — ABNORMAL HIGH (ref 70–99)
HCT: 39 % (ref 39.0–52.0)
Hemoglobin: 13.3 g/dL (ref 13.0–17.0)

## 2010-11-17 LAB — CBC
Platelets: 159 10*3/uL (ref 150–400)
WBC: 5.4 10*3/uL (ref 4.0–10.5)

## 2010-11-17 LAB — BASIC METABOLIC PANEL
BUN: 10 mg/dL (ref 6–23)
Calcium: 9.3 mg/dL (ref 8.4–10.5)
Creatinine, Ser: 0.87 mg/dL (ref 0.4–1.5)
GFR calc non Af Amer: >60 mL/min (ref >60)

## 2010-11-25 ENCOUNTER — Other Ambulatory Visit: Payer: Self-pay | Admitting: *Deleted

## 2010-11-26 ENCOUNTER — Other Ambulatory Visit: Payer: Self-pay | Admitting: *Deleted

## 2010-11-26 MED ORDER — ALPRAZOLAM 1 MG PO TABS: ORAL_TABLET | ORAL | Status: DC

## 2010-11-26 NOTE — Telephone Encounter (Signed)
Rx called to pharmacy

## 2010-11-26 NOTE — Telephone Encounter (Signed)
Rx called to [facility] pharmacy. 

## 2010-12-07 ENCOUNTER — Ambulatory Visit: Payer: Medicare Other | Admitting: Gastroenterology

## 2010-12-28 NOTE — Consult Note (Signed)
NAMEGIANCARLO, [name]                ACCOUNT NO.:  [id number]   MEDICAL RECORD NO.:  [id number]          PATIENT TYPE:  OBV   LOCATION:  A330                          FACILITY:  [facility]   PHYSICIAN:  Tilford Pillar, MD      DATE OF BIRTH:  1956/02/23   DATE OF CONSULTATION:  05/07/2008  DATE OF DISCHARGE:  05/10/2008                                 CONSULTATION   REASON FOR CONSULTATION:  Abdominal pain.   HISTORY OF PRESENT ILLNESS:  The patient is a 55 year old male with a  history of coronary artery disease, history of coronary artery bypass  graft, hypertension, as well as status post gastric bypass who presents  to [facility] emergency department with increasing abdominal pain over  the last 24 hours.  The patient is complained of decreased bowel  function with no bowel movement over the last several days and decreased  flatus over the last 24 hours.  He did state he had flatus in the  emergency department and states that this is not usual.  He describes an  abdominal pain as diffuse, colicky in nature, with no significant  radiation.  Denies any fever or chills.  He denies any change in bowel  habits.  No melena, no hematochezia noted at his last bowel movement.  He has had similar symptoms in the past, but never severe, which was  associated with the partial small bowel obstruction.  There are no  change in urination, no hematuria, no dysuria.   PAST MEDICAL HISTORY:  1. Hypertension.  2. Coronary artery disease.  3. Chronic back pain.  4. Hyperlipidemia.  5. Status post gastric bypass significant for the successful weight      loss.   PAST SURGICAL HISTORY:  1. CABG.  2. Gastric bypass.   MEDICATIONS:  1. Zocor.  2. Metoprolol.  3. Hydrocodone.  4. Lorazepam.   ALLERGIES:  CODEINE.   SOCIAL HISTORY:  Positive tobacco use.  No alcohol use.  No recreational  or drug use.   PHYSICAL EXAMINATION:  VITAL SIGNS:  Temperature 98.9, heart rate 49,  respirations 20, blood  pressure 124/73, 99% oxygen saturation on room  air.  GENERAL:  The patient does not appear any acute distress.  He is lying  in supine position in the emergency department.  He is alert and  oriented x3.  HEENT:  Pupils are equal, round, and reactive.  Extraocular movements  are intact.  No conjunctival pallor or scleral icterus noted.  PULMONARY:  Unlabored respirations.  Clear to auscultation bilaterally.  CARDIOVASCULAR:  Regular rate and rhythm.  He has 2+ radial and dorsalis  pedis pulses bilaterally.  ABDOMEN:  Positive bowel sounds.  Abdomen is soft and flat.  No  peritoneal signs are elicited.  He has mild diffused tenderness with no  rebound, no guarding.  He has positive right inguinal hernia which is  reducible and nontender.  He has no masses.  He also does have some  redundant abdominal wall.  SKIN:  Secondarily to significant weight loss, extremities are warm and  dry.   PERTINENT LABORATORY  AND RADIOGRAPHIC STUDIES:  CBC white blood cell  count 7.3, hemoglobin 13.8, hematocrit 40.1, platelets 163, __________  lymphocytes 25, monocytes 8.  Basic metabolic panel, sodium 136,  potassium 3.9, chloride 99, bicarb 29, BUN 17, creatinine 0.91, blood  glucose 131, albumin 3.5.  Urinalysis was negative.  CT of the abdomen  and pelvis demonstrated no free air.  He did have some free pelvic  fluid, but no organized collection was noted.  She has no distended  loops of small intestine.  No intestinal wall dilatation enlargement.  He has a gastric remnant, does appear to be appropriate size, since no  significant distention.  The gastric remnant for the bypass __________  appears patent.  There is no evidence of the volvulus or internal  hernia.  On evaluation, there does appear to be some minimal spiraling  of the mesentery of the small bowel vascular pedicle.  However, a  discussion with the radiologist appears to be within normal limits.  It  does not appear to be complete 360  consistent with volvulus.  There it  does appear to be some mesenteric edema consistent with venous  congestion of the mesentery as discussed with the radiologists.  Colon  also appears be nondilated.  No masses were appreciated.   ASSESSMENT AND PLAN:  Abdominal pain, unspecified.  At this point, the  patient's exam does not elicit any acute abdominal findings and no acute  surgical indications are elicited.  His symptomatology may representing  an early partial small-bowel obstruction.  However, this will not  attribute to his venous congestion.  With his history of the gastric  bypass, I did discuss with the patient the increased risk of having a  internal hernia, although this is not clearly seen on a CT evaluation,  and again based on his exam at this point, I have a low suspicion.  He  does have significant coronary artery disease which would be  contributing to the patient's poor venous return and venous congestion.  Therefore, I will continue to follow the patient surgically.  As a  surgical consultant during the patient's admission, discussed with the  patient's signs and symptoms indicating reasons to proceed more  emergently for surgical exploration.  At this point, I recommend keeping  the patient in n.p.o. status, continuing IV fluid hydration, and  continue moderate pain control.  I discussed with the patient the  importance of allowing some pain so that his symptomatology is not  masked by the pain medication and instructed the patient that should his  pain increase, __________ immediately.      Tilford Pillar, MD  Electronically Signed     BZ/MEDQ  D:  05/13/2008  T:  05/14/2008  Job:  [id number]   cc:   [facility] Team

## 2010-12-28 NOTE — H&P (Signed)
NAME:  [name], [name]                ACCOUNT NO.:  [id number]   MEDICAL RECORD NO.:  [id number]          PATIENT TYPE:  INP   LOCATION:  A330                          FACILITY:  [facility]   PHYSICIAN:  Lonia Blood, M.D.      DATE OF BIRTH:  [birth year]   DATE OF ADMISSION:  05/07/2008  DATE OF DISCHARGE:  LH                              HISTORY & PHYSICAL   PRIMARY CARE PHYSICIAN:  The patient is unassigned to [place].   PRESENTING COMPLAINT:  Abdominal pain.   HISTORY OF PRESENT ILLNESS:  The patient is a 55 year old gentleman with  past history of alcohol intake who came in complaining of not having a  bowel movement since Saturday.  He was complaining of passing gas, lower  abdominal pain.  The patient has a history of gastric bypass surgery.  Has been to at least 2 hospitals and did not get any meaningful  response.  Hence, he came here.  He denied any drinking in the last  week.  Denied any hematemesis.  No melena.  No bright red blood per  rectum.  Abdominal pain is rated as 6-8 and centrally located.   His past medical history is significant for coronary artery disease,  dyslipidemia, hypertension, insomnia.  There is question of cirrhosis  and alcoholism.  The patient denies it.   ALLERGIES:  HE IS ALLERGIC TO CODEINE THAT MAKES HIM HAVE ABNORMAL  BEHAVIOR.   MEDICATIONS:  Include:  1. Zocor 20 mg daily.  2. Metoprolol tartrate 12.5 mg b.i.d.  3. Hydrocodone acetaminophen 10/500 p.r.n.  4. Lorazepam 1 mg p.r.n.   SOCIAL HISTORY:  [place]  Denied any  alcohol or IV drug use.  He smokes about half-a-pack per day.   FAMILY HISTORY:  Mainly coronary artery disease.   PAST SURGICAL HISTORY:  Include gastric bypass surgery, [facility]   REVIEW OF SYSTEMS:  Twelve-point review of systems is mainly per HPI.   PHYSICAL EXAMINATION:  Temperature is 97.2, blood pressure 118/63.  His  pulse was 56 - down to 49, respiratory rate of 18, saturations 98% on  room  air.  GENERAL:  The patient is awake, alert, oriented in mild distress due to  pain.  HEENT:  PERRL.  EOMI.  NECK:  Is supple.  No JVD, no lymphadenopathy.  RESPIRATORY:  He has good air entry bilaterally.  No wheezes, no rales.  CARDIOVASCULAR SYSTEM:  He has mild bradycardia.  ABDOMEN:  Is soft, firm with small diffuse tenderness with positive  bowel sounds.  EXTREMITIES:  No edema, cyanosis or clubbing.   LABORATORY DATA:  White count is 7.3, hemoglobin 13.8, platelet count  63.  Sodium 136, potassium 3.9, chloride 99, CO2 29, glucose 131, BUN  11, creatinine 0.91, calcium 9.1, total protein 6.4, albumin 3.5, AST  22, and the rest LFTs are normal.  His lipase is 23.  Urinalysis  essentially negative.  CT abdomen with contrast showed diffuse  mesenteric stranding which is nonspecific but also possible anasarca  with venous congestion.  It also showed status post esophagojejunostomy  but  no focal bowel obstruction.  CT pelvis shows small to moderate  volume of free fluid layering in the deep pelvis, distended bladder but  normal with some extensive pelvic arterial congestion as well.   ASSESSMENT:  Therefore, this is a 55 year old gentleman status post  gastric bypass surgery here with abdominal pain and seemed like  constipation or obstipation.  The patient also has history of coronary  artery disease, hypertension, dyslipidemia, chronic back pain.  There is  question of previous alcoholism, but current tobacco use.   PLAN:  1. Abdominal pain.  The patient has been seen by surgery.  At this      point, there is no surgical disease.  His treatment will be mainly      observation with pain control.  Will also given some good bowel      regimen so he can move his bowels.  Will keep him n.p.o. due to      some nausea and vomiting until his pain subsides.  The main source      of his treatment will be bowel rest.  2. Coronary artery disease.  The patient has not complained of chest       pain.  Will check some enzymes.  His EKG is not showing anything      significant at this point.  3. Question of cirrhosis.  This is not confirmed either by CT or by      ultrasound.  4. Hypertension.  We will continue his home medications once he is      able to eat.  At this point, we will use IV medications as needed      to control his blood pressure.  5. Tobacco abuse.  I will put him on a nicotine patch and give him      some tobacco cessation counseling.      Lonia Blood, M.D.  Electronically Signed     LG/MEDQ  D:  05/08/2008  T:  05/08/2008  Job:  [id number]

## 2010-12-28 NOTE — Consult Note (Signed)
NAME:  [name], [name]                ACCOUNT NO.:  [id number]   MEDICAL RECORD NO.:  [id number]          PATIENT TYPE:  INP   LOCATION:  A330                          FACILITY:  [facility]   PHYSICIAN:  R. Roetta Sessions, M.D. DATE OF BIRTH:  [birth year]   DATE OF CONSULTATION:  05/08/2008  DATE OF DISCHARGE:                                 CONSULTATION   REASON FOR CONSULTATION:  Abdominal pain.   REFERRING PHYSICIAN:  [facility].   HISTORY OF PRESENT ILLNESS:  The patient is a 55 year old Caucasian  gentleman who presented to the hospital with complaints of several days'  duration of midabdominal pain.  He states his abdomen felt a little  bloated or crampy.  He says he has had this episode once before after he  ate a lot of cabbage.  He states his last bowel movement was about 6  days ago.  Generally he has a bowel movement every day or so.  He denies  any blood in the stool or melena.  He has had some flatus.  No nausea or  vomiting, heartburn, dysphagia or odynophagia.  He denies any dysuria  but states that he has developed some suprapubic cramping after  urination since he has been in the hospital.  His history is significant  for that of having gastric bypass about 3 years ago.  He has lost a  significant amount of weight.  Overall he has done fairly well.  He is  very careful not to overeat because if he does, he does have nausea and  some abdominal discomfort.   MEDICATIONS AT HOME:  1. Toprol 12.5 mg b.i.d.  2. Zocor 20 mg daily.  3. Hydrocodone/acetaminophen 10/500 mg q.i.d.  4. Xanax 1 mg q.h.s.  5. Calcium with vitamin D b.i.d.  6. Multivitamin b.i.d.  7. Aspirin 81 mg b.i.d.   ALLERGIES:  CODEINE.   PAST MEDICAL HISTORY:  1. 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  He underwent a CABG in 2005.  2. Hypertension.  3. Gastric bypass in September 2006.  4. History of diabetes  prior to his gastric bypass.  5. Chronic back pain.  6. Hyperlipidemia.  7. No prior colonoscopy.   FAMILY HISTORY:  Negative for colorectal cancer or chronic GI illnesses.   SOCIAL HISTORY:  He is disabled due to his back issues.  He is not  married but has a long-term common-law wife.  He has 1 daughter.  He  smokes a half-pack to 1 pack of cigarettes daily.  No alcohol use or  drug use.   REVIEW OF SYSTEMS:  GI:  See HPI.  CONSTITUTIONAL:  No further weight  loss.  CARDIOPULMONARY:  No chest pain, palpitations, cough, shortness  of breath.  GENITOURINARY:  See HPI.   PHYSICAL EXAMINATION:  Temperature 98.6, pulse 48, respirations 18,  blood pressure 145/74.  Weight 86 kg.  GENERAL:  A pleasant, well-nourished, well-developed Caucasian male in  no acute distress.  SKIN:  Warm and dry.  No jaundice.  HEENT:  Sclerae  nonicteric.  Oropharyngeal mucosa moist and pink.  No  lymphadenopathy.  CHEST:  Lungs are clear to auscultation.  CARDIAC:  Regular rate and rhythm.  No murmurs, rubs or gallops.  ABDOMEN:  Positive bowel sounds.  Abdomen is soft.  He has moderate  tenderness in the epigastrium and just below this and above the  umbilicus.  No rebound or guarding.  No abdominal bruits or hernias.  No  abdominal distention or evidence of significant ascites.  LOWER EXTREMITIES:  No edema.   LABORATORY DATA:  White count 6600, hemoglobin 13.4, platelets 144,000.  Sodium 142, potassium 3.7, BUN 6, creatinine 0.95, glucose 104, total  bilirubin 0.8, alkaline phosphatase 50, AST 19, ALT 14, albumin 3.2.  Urinalysis reveals small bilirubin, ketones 15.  Lipase is 23.  CT of  the abdomen and pelvis reveals diffuse mesenteric stranding, nonspecific  but in the setting of anasarca, which is suspected on the basis of  diffuse subcutaneous fat stranding __________ the possibility of venous  congestion.  The portal venous system was not confirmed patent due to  __________ of contrast.  According to the CT, positive evidence of an   esophagojejunostomy.  There was moderate free fluid in the pelvis.  The  SMA, IMA and celiac axis were all patent.   IMPRESSION:  The patient is a 55 year old Caucasian gentleman with a 4-  day history of midabdominal pain.  __________ days ago.  He has diffuse  mesenteric stranding, questionably due to venous congestion.  He also  has retained stool throughout the colon.  No evidence of obvious  obstruction.  Clinically he does not appear to have anasarca or  significant ascites.  We will discuss further with Dr. Jena Gauss.  He may  need further evaluation to determine portal vein patency.  Would  recommend a screening colonoscopy at a later date for colorectal cancer  screening.   Further recommendations to follow.  I would like to thank the [facility] for allowing [place] to take part in the care of this patient.      Tana Coast, P.AJonathon Bellows, M.D.  Electronically Signed    LL/MEDQ  D:  05/08/2008  T:  05/08/2008  Job:  [id number]   cc:   [facility]

## 2010-12-28 NOTE — Discharge Summary (Signed)
NAMEERNST, [name]                ACCOUNT NO.:  [id number]   MEDICAL RECORD NO.:  [id number]          PATIENT TYPE:  INP   LOCATION:  A330                          FACILITY:  [facility]   PHYSICIAN:  Margaretmary Dys, M.D.DATE OF BIRTH:  Jul 24, 1956   DATE OF ADMISSION:  05/07/2008  DATE OF DISCHARGE:  09/26/2009LH                               DISCHARGE SUMMARY   DISCHARGE DIAGNOSES:  1. Acute abdominal pain of unclear etiology, possibly secondary to      prior gastric bypass surgery.  2. Probable small-bowel obstruction.  3. History of coronary artery disease, stable, with no chest pain.  4. History of hypertension which is well controlled at this time.  5. History of nicotine abuse.   DISCHARGE MEDICATIONS:  1. Zocor 20 mg p.o. daily.  2. Metoprolol 12.5 mg b.i.d.  3. Hydrocodone acetaminophen 10/500 p.r.n.  4. Lorazepam 1 mg p.r.n.   SPECIAL INSTRUCTIONS:  Patient advised on tobacco cessation counseling.   CONSULTATIONS:  1. Dr. Roetta Sessions, gastroenterology for acute abdominal pain.  2. Dr. Leticia Penna, general surgery.   FOLLOWUP:  1. Follow up with Dr. Jena Gauss for colonoscopy in the future.  2. Follow up with his primary care physician as needed.   HOSPITAL COURSE:  [name] is a 55 year old male who was admitted to the  hospital on May 07, 2008, with acute abdominal pain.  Patient  reports not having a bowel movement for several days.  He also  complained of passing gas and some low abdominal pain.  Patient has had  2 prior hospitalizations with no clear etiology.  Patient reports the  pain was 6-8/10.  Patient received some IV fluids with some improvement  in his pain.  On the day of discharge, his pain had resolved.  I  discussed with the general surgeon who felt the pain was probably  related more to his gastric bypass.   Patient is now being discharged home in satisfactory condition.   LABORATORY DATA:  Pertinent laboratory data on the day of admission:  White  blood cell count 7.7, hemoglobin 13.8, platelet count 163, sodium  136, potassium 3.9, chloride 99, CO2 of 29, glucose 131, BUN 11,  creatinine 0.9, calcium 9.1, total protein 6.4, albumin 3.5, AST 22, and  rest of LFTs were normal.  Lipase was 23.  Urinalysis was negative.  CT of the abdomen with contrast shows diffuse  mesenteric stranding with status post esophageal jejunostomy with no  focal bowel obstruction.  CT of the pelvis shows mild to moderate free  fluid layering in the deep pelvis with distended bladder.      Margaretmary Dys, M.D.  Electronically Signed     AM/MEDQ  D:  05/10/2008  T:  05/10/2008  Job:  [id number]

## 2010-12-29 ENCOUNTER — Telehealth: Payer: Self-pay | Admitting: *Deleted

## 2010-12-29 NOTE — Telephone Encounter (Signed)
No answer on home phone.  Sent NOS letter.  Cancelled colon scheduled for 01/12/11.

## 2010-12-31 NOTE — Discharge Summary (Signed)
[place]  Patient:    [name], [name]                       MRN: [id number] Adm. Date:  [id number] Disc. Date: 12/21/00 Attending:  Junious Silk Dictator:   Abelino Derrick, P.A.C. [facility] CC:         [facility], [address] [place], [place] [address]  Dr. Wende Crease, [address], [place], [place] [address]   Referring Physician Discharge [place]  DISCHARGE DIAGNOSES: 1. Coronary disease status post right coronary artery stenting this admission    with small periprocedure enzyme elevation. 2. Previous interventions to the diagonal and right coronary artery. 3. History of ventricular tachycardia, noninducible by electrophysiology study    March 2001. 4. Ejection fraction of 58% at catheterization this admission. 5. Non-insulin-dependent diabetes. 6. Treated hyperlipidemia. 7. Treated hypertension. 8. History of smoking.  HOSPITAL COURSE:  Patient is a 55 year old male followed by Dr. Jens Som and Dr. Doyne Keel with a history of coronary disease.  He had a previous MI in [place] and then a SEMI in January 2001.  He has been treated with angioplasty and stenting of the RCA and of the first diagonal.  Previously his EF has been estimated at 33%.  He presented Dec 18, 2000 to the [place] office with chest pain.  It was decided to go ahead and admit him for diagnostic catheterization and further evaluation.  The patient was admitted to [facility] and put on heparin. CK-MBs and troponins were obtained which were slightly positive with a CK of 163, MB of 4.6, and a troponin of 0.87.  A second set of enzymes were lower. He underwent catheterization Dec 19, 2000 by Dr. Gerri Spore which revealed an 80% mid RCA with thrombus.  The previous stent site was patent.  The patient underwent PCI and stenting to the RCA.  Post procedure he was put on Integrilin and heparin.  Dr. Gerri Spore notes there was thrombus in the lesion. He is to get eight weeks of Plavix.  Post  procedure he did have an enzyme bump, with CKs of 334 and 11 MBs.  He was kept an extra 24 hours. Dr. Jens Som feels he can be discharged Dec 21, 2000.  He will need to see Dr. Jens Som in four to six weeks in [place].  DISCHARGE MEDICATIONS: 1. Plavix 75 mg a day for eight weeks. 2. Zocor 40 mg a day. 3. Lotensin 10 mg a day. 4. Metoprolol 25 mg twice a day. 5. Avandia 2 mg a day. 6. Coated aspirin q.d. 7. Glucotrol XL 2.5 mg a day. 8. Nitroglycerin sublingual p.r.n.  LABORATORY DATA:  As noted, CKs post procedure peaked at 334 with 11 MBs. Sodium 136, potassium 4.0, BUN 8, creatinine 0.8.  White count 8.7, hemoglobin 14.3, hematocrit 41.4, platelets 232.  INR 1.3.  EKG shows sinus rhythm without acute changes.  DISPOSITION:  Patient is discharged in stable condition.  FOLLOW-UP:  In [place] with Dr. Jens Som in four to six weeks. DD:  12/21/00 TD:  12/21/00 Job: [id number]

## 2010-12-31 NOTE — Procedures (Signed)
[place]  Patient:    [name], [name]                       MRN: [id number] Proc. Date: 10/28/99 Adm. Date:  [id number] Attending:  Lewayne Bunting CC:         Madolyn Frieze. Jens Som, M.D. [facility]             James B. Kathlene November, M.D., [place], [place]             Kathrine Cords                           Procedure Report  PROCEDURE PERFORMED:  Left physiologic study  INDICATION FOR PROCEDURE: Nonsustained V and ischemia cardiomyopathy.  I:  INTRODUCTION:  The patient is a very pleasant 55 year old male with a history of coronary artery disease status post MI and status post PTCA.  He has an estimated LV ejection fraction of 30%.  On Holter monitoring he had nonsustained VT.  He is subsequently referred for electrophysiologic study to evaluate his risk for sudden cardiac death.  II:  PROCEDURE:  After informed consent was obtained, the patient was taken to he diagnostic EP lab in a fasting state.  After the usual preparation and draping,  intravenous Fentanyl and midazolam were given for conscious sedation.  A 5 French quadripolar catheter was inserted percutaneously into the right femoral vein and advanced to the RV apex.  A 5 French quadripolar catheter was inserted percutaneously in the right femoral vein and advanced to the His bundle region.  After measurement of the basic intervals, rapid ventricular pacing was carried ut from the RV apex which demonstrated a VA Wenckebach cycle length of 340 msec. ext a programmed ventricular stimulation was carried out from the RV apex at a basic cycle length of 600 msec.  S1-S2, S1-S2-S3, and S1-S2-S3-S4 stimuli were delivered to the RV apex at basic cycle lengths of both 600 and 400 msec.  In addition, long short stimuli were delivered at a 400/600 couplet interval.  The S1-S2, S2-S3, nd S3-S4 intervals were step-wise decreased down to ventricular refractory and this demonstrated no inducible VT. No sustained  monomorphic VT.  The patient did have nonsustained polymorphic VT at S1-S2-S3-S4 stimuli but this was not sustained and did not degenerate into ventricular fibrillation.  Next, the catheter was maneuvered into the RV out flow track and programmed ventricular stimulation was again carried out from the RV out flow at a basic cycle lengths of both 600 and 400 msec. S1-S2, S1-S2-S3, and S1-S2-S3-S4 stimuli were delivered from the RV out flow track with the S-S2, S2-S3, and S3-S4 in flow stepwise decreased down to ventricular refractions.  During programmed ventricular stimulation from the RV out flow track there was no inducible VT.  Next, the catheter was maneuvered into the high right atrium and rapid atrial pacing was carried out from the high right atrium at pacing cycle length of 490 msec.  Step-wise decreased down 330 msec. At pacing interval of 330 msec AV Wenckebach was observed.  During rapid atrial pacing the PR interval became equal to but not greater than the RR interval.  There was no inducible SVT.  Next, programmed atrial stimulation was carried out from the high right atrium at a basic cycle length of 500 msec.  The S1-S2 interval was stepwise decreased down to 200 msec where the ERP of the AV node was observed.  At this point, the catheter was removed and returned to his room in good condition. Please note that the patient did have a moderate size hematoma develop and pressure was held with good result.  The patient was then returned to his room in good condition.  III:  COMPLICATIONS: There was a moderate sized hematoma present which occurred  after the catheter was removed and pressure was held.  IV:  RESULTS: A: BASELINE LINE 12 LEAD EKG: Baseline 12 lead EKG shows a normal sinus rhythm ith sinus bradycardia in the 50s and a rate of 55 beats per minute.  B: BASELINE INTERVALS: This sinus node cycle length was 1087 msec.  The QRS duration was 135 msec.  The  AH interval 81 msec.  The HV interval 49 msec. The T interval was 448 msec.  C: RAPID ATRIAL PACING:  Rapid atrial pacing from the high right atrium demonstrated AV Wenckebach cycle length of 330 msec.  D: PROGRAMMED ATRIAL STIMULATION:  Programmed atrial stimulation was carried out from the high right atrium at a basic cycle length of 500 msec.  The S1-S2 interval step-wise decreased down to 200 msec where the ERP of the AV node was observed.  E: RAPID VENTRICULAR PACING:  Rapid ventricular pacing was carried out from the RV apex at a basic cycle length of 490 msec and step-wise decreased down to 340 msec where VA Wenckebach was observed.  F: PROGRAMMED VENTRICULAR STIMULATION:  Programmed ventricular stimulation was carried out from the RV apex and RV out flow track with basic cycle length of 600 and 400 msec.  The S1-S2, S1-S2-S3, and S1-S2-S3-S4 stimuli were delivered with the S1-S2, S2-S3, and S3-S4 intervals stepwise decreased down to ventricular refractory.  During programmed ventricular stimulation from the RV apex as well as RV out flow track there was no inducible sustained monomorphic VT.  V:  CONCLUSIONS:  Study demonstrates no evidence of inducible monomorphic VT and no evidence of inducible SVT.  The patient will continue on his present medical regimen. DD:  10/28/99 TD:  10/28/99 Job: [id number]

## 2010-12-31 NOTE — Cardiovascular Report (Signed)
. [facility]  Patient:    [name]                        MRN: [id number] Proc. Date: 09/13/99 Adm. Date:  [id number] Attending:  Junious Silk CC:         Madolyn Frieze. Jens Som, M.D. [facility]             Elby Beck, M.D., [place], [place]                        Cardiac Catheterization  PROCEDURE PERFORMED: 1. Left heart catheterization. 2. Left ventriculogram.  DIAGNOSES: 1. Three-vessel coronary artery disease. 2. Moderate left ventricular systolic dysfunction.  HISTORY:  [name] is a 55 year old white male with multiple cardiac risk factors and no history of coronary artery disease.  He had previously undergone percutaneous intervention with stenting of an unknown vessel approximately two years ago in [place].  He has done well in the interim until recently when he has had recurrence of substernal chest discomfort with radiation to the back.  This is similar to his previous anginal pains.  The patient was admitted to the hospital, and initial troponin enzymes were slightly elevated consistent with non-Q-wave myocardial infarction.  He presents now for left heart catheterization.  DESCRIPTION OF PROCEDURE:  After informed consent was obtained, the patient was  brought to the catheterization lab where both groins were sterilely prepped and  draped.  Lidocaine, 1%, was used to infiltrate the right groin.  A 6-French sheath was placed in the right femoral artery using a modified Seldinger technique. The 6-French JL4 and JR4 catheters were then used to engage the left and right coronary arteries.  Selective angiography was performed in various projections using manual injections of contrast.  A 6-French pigtail catheter was then advanced to the left ventricle, and a left ventriculogram was performed using power injections of contrast.  At the termination of the case, the catheters and sheath were removed, and manual pressure was applied  until adequate hemostasis was achieved.  The patient tolerated the procedure well and was transferred to the floor in stable  condition.  Findings are as follows:  FINDINGS: 1. The left main trunk:  Large caliber vessel, angiographically normal. 2. LAD:  This is a large caliber vessel that provides two diagonal branches. The    LAD has mild diffuse disease in the proximal section.  The mid section has    moderate disease with a focal narrowing of 60-70% at the takeoff of the large    second diagonal branch.  The distal LAD then has moderate diffuse disease with    narrowing of 30-40%.  The first diagonal branch is a large caliber vessel.    There is a stent in the proximal segment that is widely patent.  The second    diagonal branch is a medium caliber vessel that has mild irregularities. 3. The left circumflex artery is a medium caliber vessel that provides two    proximal and two distal marginal branches.  A large second marginal branch has    a narrowing of 60-70% in the proximal segment.  The remainder of the left    circumflex system has mild diffuse disease. 4. The right coronary artery is dominant.  This begins as a large caliber vessel.    It appears to provide the posterior descending artery in its terminal segment.    The right  coronary artery has mild diffuse disease in the proximal mid section    with a focal narrowing of 50% in the mid section of the vessel.  The distal CA    then appears to be 100% occluded with thrombus and possible high grade stenosis    although this segment is not well visualized.  The very distal segment of the    posterior descending artery fills the collaterals from the RV marginal branch.    TIMI-1 flow is noted in this distal PDA.  LV:  Mildly dilated end systolic and end diastolic dimensions.  Overall left ventricular function is well preserved.  Ejection fraction of approximately 25-30%. There is global hypokinesis.  No mitral  regurgitation is noted.  LV pressure is  114/5.  Aortic equals 114/70.  LVEDP equals 15.  ASSESSMENT AND PLAN:  [name] is a 55 year old gentleman who appears to have suffered a non-Q-wave myocardial infarction.  The patients enzymes have returned to normal, and CCG shows no acute changes.  As the patient is hemodynamically stable, we will consider an infusion of a IIb/IIa inhibitor for 24-48 hours and  then perform re-look angiography to further assess the distal RCA. DD:  09/13/99 TD:  09/13/99 Job: [id number]

## 2010-12-31 NOTE — Discharge Summary (Signed)
[place]  Patient:    [name]                        MRN: [id number] Adm. Date:  [id number] Disc. Date: 09/16/99 Attending:  Junious Silk Dictator:   Lavella Hammock, P.A.-C. CC:         Wende Crease, M.D. - [place], [place]                           Discharge Summary  DATE OF BIRTH:  1955/08/17  PROCEDURE: 1. Cardiac catheterization. 2. Coronary arteriogram. 3. Left ventriculogram. 4. Percutaneous transluminal coronary angioplasty and stent of one vessel.  HISTORY OF PRESENT ILLNESS:  Mr. Pott is a 55 year old male with a history of coronary artery disease who developed substernal chest pain on the day of admission, which started at rest and radiated through to his back.  He went to [facility], and the pain resolved after about 1-1/2 hours with the use of nitroglycerin.  HOSPITAL COURSE:  He was transferred to [facility] for further evaluation and possible catheterization.  His enzymes were negative for a myocardial infarction.  His blood sugar was found to be elevated at 197, so a hemoglobin A1C was done, and that proved to be elevated as well at 10.2%.  He was started on Glucotrol for this.  He was scheduled for a cardiac catheterization, and this was done on September 13, 1999.  The left main was large and normal.  The LAD had two diagonals and the LAD had a 60% midstenosis.  The stent in the first diagonal was patent.  The circumflex had two proximal and two distal OMs. There was a large OM-II with a 60%-70% proximal stenosis.  The RCA was dominant and was occluded distally with questionable thrombus.  There was TIMI-1 flow.  There was a 50% stenosis in the midsection as well.  His ejection fraction was 25%-30% with  global hypokinesis and no mitral regurgitation.  He was placed on ReoPro and scheduled for a relook angiogram which was done on September 15, 1999, and it showed a 70%-80%  distal RCA thrombus, as well as a 30%-40% lesion in the distal RCA as  well.  He had a PTCA and stent to the distal RCA with an extraction catheter and the stenosis was reduced to 5% with TIMI-3 flow.  The ReoPro was continued overnight.  DISPOSITION:  By September 16, 1999, his groin was stable with no hematoma, and no bruit.  He was chest pain-free and ambulatory without difficulty.  His vital signs were stable, and he was considered stable for discharge on September 16, 1999.  FOLLOWUP:  He is to follow up with Dr. Wende Crease in [place] in two weeks, for ew onset of diabetes, and he is to obtain an outpatient Cardiolite in six to eight  weeks after his first visit at the [facility] office.  Chest x-ray:  Heart size within normal limits.  Prominent pulmonary vasculature, but no evidence of infiltrate.  Electrocardiogram:  Sinus rhythm with right atrial enlargement and marked anterolateral T-wave inversions.  LABORATORY DATA:  Hemoglobin 14.6, hematocrit 40.9, wbcs 7.5, platelets 191. Sodium 133, potassium 3.8, chloride 103, CO2 of 25, BUN 12, creatinine 0.7, glucose 196.  CPK-MB 96/3.8, troponin I 0.20.  Hemoglobin A1C 10.2.  CONDITION ON DISCHARGE:  Improved.  CONSULTATION:  None.  COMPLICATIONS:  None.  DISCHARGE  DIAGNOSES: 1. Coronary artery disease, status post percutaneous transluminal coronary    angioplasty and stent to the right coronary artery this admission. 2. History of coronary artery disease with prior percutaneous transluminal coronary    angioplasty and stent to the first diagonal. 3. Left ventricular dysfunction with an ejection fraction of 25%-30%. 4. New onset diabetes mellitus. 5. History of hypertension. 6. History of hyperlipidemia. 7. History of gastroesophageal reflux disease symptoms. 8. Chronic back pain.  DISCHARGE INSTRUCTIONS: 1. He is to do no driving, no strenuous activity, no sexual activity for two    days. 2. He is to stick to a low-fat  diabetic diet. 3. He is to call the office for bleeding, swelling, or drainage at the    catheterization site. 4. He is not to use tobacco.  FOLLOWUP:  He is to follow up with Dr. Doyne Keel, and to call for an appointment.  He is to follow up with Dr. Madolyn Frieze. [place] on October 12, 1999, at 10 a.m.  DISCHARGE MEDICATIONS: 1. Glucotrol XL 2.5 mg q.d. 2. Coated aspirin 325 mg q.d. 3. Lipitor 10 mg q.d. 4. Plavix 75 mg q.d. 5. Lopressor 50 mg b.i.d. 6. Lotensin 10 mg q.d. 7. Nitroglycerin 0.4 mg sublingual p.r.n. DD:  09/16/99 TD:  09/16/99 Job: [id number]

## 2010-12-31 NOTE — Consult Note (Signed)
NAME:  Dennis, Patterson                          ACCOUNT NO.:  [id number]   MEDICAL RECORD NO.:  [id number]                   PATIENT TYPE:  INP   LOCATION:  2927                                 FACILITY:  [facility]   PHYSICIAN:  Gwenith Daily. Tyrone Sage, M.D.            DATE OF BIRTH:  13-Jun-1956   DATE OF CONSULTATION:  02/22/2004  DATE OF DISCHARGE:                                   CONSULTATION   REQUESTING PHYSICIAN:  Dr. Willa Rough.   PREVIOUS CARDIOLOGIST:  Previous cardiologist was Dr. Olga Millers but has  not been seen since 2002 because his insurance ran out.   PRIMARY CARE PHYSICIAN:  Dr. Doyne Keel.   REASON FOR CONSULTATION:  Acute myocardial infarction.   HISTORY OF PRESENT ILLNESS:  The patient is a 55 year old male who, on February 21, 2004, awoke approximately 4 a.m. in the morning, having substernal chest  pain radiating into the back and down both arms associated with diaphoresis  but no nausea.  The patient notes that his pain was similar to the pain that  he had with 3 previous myocardial infarctions, the first one in 1998,  treated in [place], [place], and subsequently in years 2000 and 2002,  treated by [facility].  Because the pain was persistent, the patient  went to the emergency room in [facility] and was transferred to [facility]  and urgently underwent cardiac catheterization and angioplasty of the right  coronary artery.  In [place], he was given a single dose of Plavix and  treated with thrombolytics but without evidence of reperfusion.  Peak CK was  2642 with a peak MB of 276 and a peak troponin I of 85.  A cardiac surgery  consultation is now requested because at the time of catheterization, the  right coronary artery was opened and reperfused but the patient has residual  disease throughout the coronary syndrome.   PAST MEDICAL HISTORY:  Previous cardiac history is noted above, myocardial  infarctions treated with angioplasty and stents in 1998 in  [place], [place],  and 2000 and 2002 at [facility].  It appears that he has previously had  stents in the diagonal and right coronary arteries.  He has had no previous  cardiac surgery.   CARDIAC RISK FACTORS:  The patient denies hypertension.  He does have known  hyperlipidemia and has been taking Zocor 80 mg a day.  He has known  diabetes, first diagnosed in 2000, currently treated with oral agents;  hemoglobin  A1c is 6.7.  The patient is a smoker and continues to smoke up  until today, 1-1/2 packs per day.  He has a positive family history of  cardiac disease with his father dying of a myocardial infarction at age 33  and a brother dying at age 6 of myocardial infarction.  The patient's  mother died at age 7 with emphysema.  The patient denies any previous  stroke, denies  claudication, denies renal insufficiency.   PAST MEDICAL HISTORY:  1. Bad back.  2. History of nonsustained VT evaluated at [facility] with EP study in     2000 and did not have inducible VT.  3. Last eye exam with diabetes was in 2003.  4. Chronic left leg swelling.   PAST SURGICAL HISTORY:  Tonsillectomy as a child.   SOCIAL HISTORY:  The patient is single, lives with his girlfriend.  He has  been on medical disability since 2002, disability because of diabetes, heart  disease and back problems.  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   MEDICATIONS PRIOR TO ADMISSION:  1. Zocor 80 mg a day.  2. Actos 45 mg a day.  3. Aspirin 325 mg a day.  4. Glyburide 10 mg b.i.d.  5. Lopressor 25 mg b.i.d.  6. Lisinopril 10 mg q.a.m.  7. He was treated with thrombolytics on February 21, 2004 and also given a single     dose of Plavix 300 mg on February 21, 2004.   DRUG ALLERGIES:  Drug allergies include CODEINE.  Note:  The patient notes  it makes him nuts.   REVIEW OF SYSTEMS:  CARDIAC:  Cardiac review of systems is positive for  chest pain, exertional shortness of breath and  chronic many years of  palpitations and lower extremity edema, especially on the left.  He denies  orthopnea, presyncope or syncope.  GENERAL REVIEW OF SYSTEMS:  The patient  denies any constitutional symptoms, denies any recent change in weight.  RESPIRATORY:  As noted above, exertional shortness of breath.  GASTROINTESTINAL:  Denies any blood in his stool.  NEUROLOGICAL:  Denies  amaurosis or TIAs.  MUSCULOSKELETAL:  Complains of bilateral hip pain.  Denies knee or joint pain.  GU:  Denies hematuria.  He has had no recent  infections.  HEMATOLOGIC:  No history of easy bruisability.  ENDOCRINE:  Positive history for diabetes.  Denies psychiatric history.  Denies  claudication.  Other review of systems are negative.   PHYSICAL EXAM:  VITAL SIGNS:  Physical exam reveals blood pressure 117/64,  pulse is 70 and regular, respiratory rate 20.  Six-feet tall, 340 pounds.  GENERAL:  The patient is in the coronary care unit without acute distress.  He is awake and alert and neurologically intact, able to relate his history  without difficulty.  HEENT:  Pupils are equal, round and reactive to light.  NECK:  Neck is without carotid bruits.  LUNGS:  Lungs are clear to auscultation and percussion without evidence of  wheezing.  CARDIAC:  Exam revealed a regular rate and rhythm without murmur or gallop.  ABDOMINAL EXAM:  Moderate obesity but without definite palpable masses.  NODES:  He has no palpable cervical, supraclavicular, axillary or inguinal  lymph nodes.  EXTREMITIES:  Lower extremities with some chronic venous stasis changes in  the lower extremity around the ankle with superficial varicosities.  Patient  is right handed.  VENOUS SYSTEM:  He appears to have adequate veins at both ankles.  VASCULAR:  Exam reveals +1 DP and PT pulses bilaterally.   LABORATORY EVALUATION:  Laboratory evaluation reveals creatinine of 1.2.  Cardiac catheterization reveals 40% ejection fraction, 70% to 80%  proximal  LAD, 70% diagonal, obtuse marginal 90%.  Circumflex is a small hypoplastic  vessel.   The patient initially presented with total occlusion of the proximal right  coronary artery.  This was opened acutly.  There is still 30% to 40%  proximal residual disease.  Examination of the left system reveals patent  LAD, a large first obtuse marginal with a proximal 70% to 80% lesion,  overall ejection fraction approximately 40%.   IMPRESSION:  The patient has significant three-vessel coronary artery  disease following acute angioplasty of the right coronary artery which was  acutely occluded.  This vessel is now open but the patient has significant  residual three-vessel coronary artery disease.  After approximately 6-7 days  of recovery, proceed with CABG.  Coronary artery bypass grafting is  recommended to the patient because of the residual disease in the right  coronary artery and significant stenosis of the obtuse marginal artery LAD  and diagonal.  He has an ejection fraction of 40%.  We will check carotid  Doppler studies.  The risks of surgery including death, infection, stroke,  myocardial infarction, bleeding, blood transfusion, all were discussed with  the patient in detail.Gwenith Daily Tyrone Sage, M.D.    Tyson Babinski  D:  02/22/2004  T:  02/23/2004  Job:  [id number]

## 2010-12-31 NOTE — Consult Note (Signed)
NAME:  [name], [name]                          ACCOUNT NO.:  [id number]   MEDICAL RECORD NO.:  [id number]                   PATIENT TYPE:  INP   LOCATION:  2927                                 FACILITY:  [facility]   PHYSICIAN:  Gwenith Daily. Tyrone Sage, M.D.            DATE OF BIRTH:  1956-03-13   DATE OF CONSULTATION:  02/22/2004  DATE OF DISCHARGE:                                   CONSULTATION   CONTINUATION   Coronary artery bypass grafting is recommended to the patient because of the  residual disease in the right coronary artery and significant stenosis of  the obtuse marginal artery LAD and diagonal.  He has an ejection fraction of  40%.  We will check carotid Doppler studies.  The risks of surgery including  death, infection, stroke, myocardial infarction, bleeding, blood transfusion  all were discussed with the patient in detail.                                               Gwenith Daily Tyrone Sage, M.D.    Tyson Babinski  D:  02/22/2004  T:  02/22/2004  Job:  [id number]

## 2010-12-31 NOTE — Op Note (Signed)
NAME:  [name], [name]                          ACCOUNT NO.:  [id number]   MEDICAL RECORD NO.:  [id number]                   PATIENT TYPE:  INP   LOCATION:  2312                                 FACILITY:  [facility]   PHYSICIAN:  Evelene Croon, M.D.                  DATE OF BIRTH:  [birth year]   DATE OF PROCEDURE:  03/01/2004  DATE OF DISCHARGE:                                 OPERATIVE REPORT   PREOPERATIVE DIAGNOSES:  Severe three-vessel coronary artery disease, status  post acute inferior myocardial infarction.   POSTOPERATIVE DIAGNOSES:  Severe three-vessel coronary artery disease,  status post acute inferior myocardial infarction.   OPERATION PERFORMED:  Median sternotomy, extracorporeal circulation,  coronary artery bypass grafting surgery times four using a left internal  mammary artery graft to the left anterior descending coronary artery, with a  sequential saphenous vein graft to the first diagonal branch of the left  anterior descending and the obtuse marginal branch of the left circumflex  coronary artery, and a saphenous vein graft to the posterior descending  coronary artery.  Endoscopic vein harvesting from the left leg.   SURGEON:  Alleen Borne, M.D.   ASSISTANT:  Pecola Leisure, PA   ANESTHESIA:  General endotracheal.   INDICATIONS FOR PROCEDURE:  The patient is a 55 year old obese diabetic  smoker with a history of coronary disease status post three previous  myocardial infarctions in 1998, 2000, and 2002.  He had previously placed  stents in the diagonal and right coronary arteries.  On February 21, 2004 he  developed severe persistent chest pain that woke him up in the morning.  He  went to the emergency room in [place] and had thrombolytic therapy without  evidence of reperfusion.  His peak CPK was 2642 with an MB of 276.  He was  transferred to [facility] and underwent urgent cardiac  catheterization which showed occlusion of this right coronary artery.   This  was opened with PTCA.  In addition, he also had 75% proximal and mid-LAD  stenosis.  There was a diagonal branch that had 75% in-stent restenosis  proximally.  The left circumflex had a single marginal branch that had 90%  proximal stenosis.  Left ventricular ejection fraction about 44% with severe  inferior akinesis.  There was no gradient across the aortic valve and no  mitral regurgitation.  The patient improved following angioplasty.  It was  felt that coronary artery bypass surgery was the best treatment for his  residual coronary disease.  The operative procedure was discussed with him  including alternatives, benefits, and risks including bleeding, possible  blood transfusion, infection, stroke, myocardial infarction and death.  We  also discussed the importance of maximum cardiac risk factor reduction with  him including complete smoking cessation, control of his lipids and  diabetes, exercise and weight loss.  He understood and agreed to proceed.   DESCRIPTION  OF PROCEDURE:  The patient was taken to the operating room and  placed on the table in supine position.  After induction of general  endotracheal anesthesia, a Foley catheter was placed in the bladder using  sterile technique.  Then the chest, abdomen and both lower extremities were  prepped and draped in the usual sterile manner.  The chest was entered  through a median sternotomy incision and the pericardium opened in the  midline.  Examination of the heart showed good ventricular contractility.  The ascending aorta had no palpable plaques in it.   Then the left internal mammary artery was harvested from the chest wall as a  pedicle graft.  This was a medium caliber vessel with excellent blood flow  through it.  At the same time a segment of greater saphenous vein was  harvested from the left leg using endoscopic vein harvest technique.  This  was extremely difficult dissection due to the large size of his leg and   morbid obesity.  In order to extract the vein, it was necessary to open the  endoscopic tunnel over the vein in the upper thigh.  The vein was of medium  size and good quality.  We initially tried to find a vein adjacent to the  right knee but could not find adequate vein.  I suspect that we never found  the true saphenous vein.   Then the patient was heparinized and when an adequate activated clotting  time was achieved, the distal ascending aorta was cannulated using a 24  French aortic cannula for arterial inflow.  Venous outflow was achieved  using a two-stage venous cannula for the right atrial appendage.  An  antegrade cardioplegia and vent cannula was inserted in the aortic root.   The patient was placed on cardiopulmonary bypass and the distal coronary  arteries identified.  The LAD was heavily diseased proximally.  There was  minimal distal disease in it.  The diagonal branch was heavily diseased  proximally within the stented area.  It bifurcated into two subbranches.  The more lateral subbranch was intramyocardial and appeared to be diving  fairly deeply into the muscle.  The medial branch was on the surface and was  a medium size graftable vessel with no significant disease in it.  They  appeared to freely communicate on angiogram.  The left circumflex had a  single marginal branch which was lying quite high on the lateral wall and  was a medium sized graftable vessel.  The right coronary artery was  diffusely diseased and this extended out to the take off of the posterior  descending branch.  The posterior descending branch itself had some distal  disease in it.   Then the aorta was crossclamped and 600 mL of cold blood antegrade  cardioplegia was administered in the aortic root with quick arrest of the  heart.  Systemic hypothermia to 20 degrees centigrade and topical  hypothermia with iced saline was used.  A temperature probe was placed on the septum and insulating pad  in the pericardium.   The first distal anastomosis was performed to the diagonal branch.  The  internal diameter was about 1.6 mm.  The conduit used was a segment of  greater saphenous vein.  The anastomosis was performed in a sequential side-  to-side  manner using continuous 7-0 Prolene suture.  Flow was measured  through the graft and was excellent. The second distal anastomosis was  performed to the obtuse marginal branch.  The internal diameter of this  vessel was about 1.6 mm.  The conduit used was the same segment of greater  saphenous vein.  The anastomosis was performed in a sequential end-to-side  manner using continuous 7-0 Prolene suture.  Flow was measured through the  graft and was excellent.   Then another dose of cardioplegia was given down the vein graft and in the  aortic root.  The third distal anastomosis was performed to the posterior  descending coronary artery.  The internal diameter was about 1.75 mm.  The  conduit used was a second segment of greater saphenous vein.  The  anastomosis was performed in an end-to-side manner using continuous 7-0  Prolene suture.  Flow was measured through the graft and was excellent.  The  fourth distal anastomosis was performed to the distal portion of the left  anterior descending coronary artery.  The internal diameter was about 2 mm.  The conduit used was the left internal mammary graft and this was brought  through an opening in the left pericardium anterior to the phrenic nerve.  It was anastomosed to the LAD in end-to-side manner using continuous 8-0  Prolene suture.  The pedicle was tacked to the epicardium with 6-0 Prolene  sutures.  The patient was then rewarmed to 37 degrees centigrade.  With the  cross clamp in place, the two proximal vein graft anastomoses were performed  to the aortic root in end-to-side manner using continuous 6-0 Prolene  suture.  The clamp was removed from the mammary pedicle.  There was rapid   warming of the ventricular septum and return of spontaneous ventricular  fibrillation. The cross-clamp was removed with a time of 76 minutes and the  patient spontaneously converted to sinus rhythm.   The proximal and distal anastomoses appeared hemostatic and the line of the  graft satisfactory.  Graft markers were placed around the proximal  anastomoses.  Two temporary right ventricular and right atrial pacing wires  placed and brought out through the skin.   When the patient had rewarmed to 37 degrees centigrade, he was weaned from  cardiopulmonary bypass on low-dose dopamine.  Total bypass time was 95  minutes.  Cardiac function appeared good with a cardiac output of 7 L per  minute.  Protamine was given and the venous and aortic cannulas were removed  without difficulty.  Hemostasis was achieved.  Three chest tubes were placed  with a tube in the posterior pericardium, one in the left pleural space and one in the anterior mediastinum.  The pericardium was loosely approximated  over the heart.  The sternum was closed using double #6 stainless steel  wires.  The fascia was closed with continuous #1 Vicryl suture.  The  subcutaneous tissue was closed with continuous 2-0 Vicryl and the skin with  3-0 Vicryl subcuticular closure.  The lower extremity vein harvest site was  closed in layers in a similar manner.  Sponge, needle and instrument counts  were correct according to the scrub nurse.  Dry sterile dressings were  applied over the incisions, around the chest tubes which were hooked to  Pleur-evac suction.  The patient remained hemodynamically stable and was  transported to the SICU in guarded but stable condition.  Evelene Croon, M.D.    BB/MEDQ  D:  03/01/2004  T:  03/01/2004  Job:  [id number]   cc:   Olga Millers, M.D. [facility]

## 2010-12-31 NOTE — Cardiovascular Report (Signed)
NAME:  TIFFANY, TALARICO                          ACCOUNT NO.:  [id number]   MEDICAL RECORD NO.:  [id number]                   PATIENT TYPE:  INP   LOCATION:  2927                                 FACILITY:  [facility]   PHYSICIAN:  Carole Binning, M.D. [facility]         DATE OF BIRTH:  1955/08/21   DATE OF PROCEDURE:  02/21/2004  DATE OF DISCHARGE:                              CARDIAC CATHETERIZATION   PROCEDURE PERFORMED:  1. Left heart catheterization with coronary angiography and left     ventriculography.  2. Thrombectomy of the right coronary artery utilizing the export catheter     followed by percutaneous transluminal coronary angioplasty.   INDICATION:  Mr. Horen is a 55 year old male with history of coronary artery  disease and previous percutaneous coronary interventions with previous  stents in the mid right coronary artery as well as stent in the diagonal  branch.  He presented to [facility] Emergency Room this morning at  approximately  6 a.m. with approximately 90 minutes of severe substernal  chest pain.  EKG was consistent with an acute inferior wall myocardial  infarction.  He was given thrombolytic therapy with tenecteplase.  Despite  thrombolytic therapy, he had persistent chest pain and persistent ST segment  elevation.  He was therefore transferred emergently to [facility]  for rescue coronary angioplasty.  He was having ongoing chest pain on  arrival.   CATHETERIZATION PROCEDURAL NOTE:  A 6 French sheath was placed in the right  femoral vein and a 6 French sheath in the right femoral artery.  Coronary  angiography was performed with standard Judkins 6 French catheters.  Left  ventriculography and abdominal aortography were performed with an angled  pigtail catheter.  Contrast was Omnipaque.  There were no complications.   CATHETERIZATION RESULTS:   HEMODYNAMICS:  1. Left ventricular pressure 120/26.  2. Aortic pressure 120/80.  3. There is no aortic valve  gradient.   LEFT VENTRICULOGRAM:  There is severe akinesis of the inferior wall.  Ejection fraction is calculated at 44%.  There is no mitral regurgitation.   ABDOMINAL AORTOGRAM:  Abdominal aortogram reveals normal abdominal aorta,  renal arteries and iliac arteries.   CORONARY ARTERIOGRAPHY (RIGHT DOMINANT):  Left main is normal.   Left anterior descending artery has a tubular 30% stenosis in the proximal  vessel followed by 75-85% stenosis in the proximal vessel at the bifurcation  of a large diagonal branch.  There is moderate diffuse calcium in the  proximal and mid left anterior descending artery.  In the mid left anterior  descending artery there is a tubular 75% stenosis followed by a 50%  stenosis.  The LAD gives rise to a very large first diagonal branch which  has a stent in the proximal portion.  There is a diffuse 75% stenosis within  the stent.  This diagonal branch bifurcates and supplies a large portion of  the anterior lateral wall.  Left circumflex has as 30% stenosis in the mid vessel.  The circumflex gives  rise to a large first obtuse marginal branch which has a 90% stenosis  proximally.  The distal circumflex gives rise to a small second obtuse  marginal and normal size third obtuse marginal branch.   Right coronary artery is a dominant vessel.  There is a 50% stenosis in the  ostial portion of the right coronary artery.  The proximal right coronary  artery is 100% occluded with thrombus and TIMI-0 flow.  This appears to be  the proximal margin of the previously placed stents.  After reperfusion, the  right coronary artery was found to be very large distally giving rise to a  very large posterior descending artery which supplies the inferior apical  wall.  There are two small posterior lateral branches arising from the  distal right coronary artery.  There is a 30% stenosis in the mid to distal  portion of the posterior descending artery.   IMPRESSION:  1.  Moderately decreased left ventricular systolic function secondary to an     acute myocardial infarction with underlying ischemic heart disease.  2. Three vessel coronary artery disease.  The culprit is 100% occlusion in     the proximal right coronary artery.  The patient also has significant     disease in his left coronary arteries as described.   PLAN:  Percutaneous intervention of the right coronary artery.  See below.   PERCUTANEOUS TRANSLUMINAL CORONARY ANGIOPLASTY PROCEDURAL NOTE:  Following  completion of the diagnostic catheterization, we proceeded directly with  percutaneous coronary intervention.  We utilized the pre-existing 6 French  sheath in the right femoral artery.  Heparin was administered to achieve an  ACT of greater than 300 seconds.  Because of the recent timing of  thrombolytic therapy, we did not utilize glycoprotein 2b3a inhibitor.  We  used a 6 [place] JR-4 guiding catheter.  An Asahi soft coronary guide wire  was advanced under fluoroscopic guidance successfully beyond the occlusion  and positioned in the distal portion of the right coronary artery.  We then  performed thrombectomy of the proximal and mid right coronary artery with an  export catheter.  Two passes were performed.  This did achieve reperfusion  into the distal vessel with residual and moderate degree of thrombus within  the mid right coronary artery and stenosis at the proximal margin of the  stent in the proximal right coronary artery.  We therefore went in with a  3.5 x 20-mm Quantum balloon and positioned this in the distal portion of the  diseased segment in the mid vessel and inflated it to 18 atmospheres.  We  then pulled this balloon back into the proximal segment of disease and  inflated this to 20 atmospheres.  We then went back in with a 3.75 x 20-mm  Quantum balloon and inflated this to 20 atmospheres in the mid portion of the right coronary artery and 20 atmospheres in the proximal  portion of the  right coronary artery.  Intermittent doses of intracoronary nitroglycerin  and Verapamil were administered to improve antegrade flow.  Final  angiographic images were then obtained revealing patency of the right  coronary artery with approximately 25% residual stenosis in the proximal  portion of the lesion and less than 20% residual stenosis in the mid right  coronary artery and TIMI-3 flow into the distal vessel.   COMPLICATIONS:  None.   RESULTS:  Successful thrombectomy followed by  percutaneous transluminal  coronary angioplasty of the proximal and mid right coronary artery.  100%  occlusion with thrombus was reduced to 25% residual with TIMI-3 flow.   PLAN:  Because of the recent thrombolytics, sheath will be left in until 12  hours post thrombolytic therapy.  The patient will be treated with heparin  therapy both before and after sheath removal.  Cardiovascular surgery will  be consulted for evaluation for coronary artery bypass surgery based on the  presence of three-vessel coronary artery disease and decreased left  ventricular function in a diabetic patient.                                               Carole Binning, M.D. [facility]    MWP/MEDQ  D:  02/21/2004  T:  02/22/2004  Job:  [id number]   cc:   Dr. Wende Crease in [facility] [place] in [place]

## 2010-12-31 NOTE — Discharge Summary (Signed)
NAME:  [name], [name]                          ACCOUNT NO.:  [id number]   MEDICAL RECORD NO.:  [id number]                   PATIENT TYPE:  INP   LOCATION:  2002                                 FACILITY:  [facility]   PHYSICIAN:  Evelene Croon, M.D.                  DATE OF BIRTH:  [birth year]   DATE OF ADMISSION:  02/21/2004  DATE OF DISCHARGE:  03/08/2004                                 DISCHARGE SUMMARY   ADMISSION DIAGNOSIS:  Known coronary artery disease with unstable angina.   ADDITIONAL DIAGNOSES/DISCHARGE DIAGNOSES:  1. Known history of coronary artery disease, status post prior myocardial     infarctions, treated with stenting of the diagonal and right coronary     artery.  2. Status post acute inferior myocardial infarction during this admission     secondary to severe three-vessel coronary artery disease.  3. Status post coronary artery bypass grafting x4 completed on March 01, 2004.  4. Type 2 diabetes mellitus.  5. Hypertension.  6. Dyslipidemia.  7. Longstanding tobacco abuse.  8. History of nonsustained ventricular tachycardia with a negative     electrophysiology study in 2001.  9. Obesity.  10.      Status post stenting to the right coronary artery in 2001 for a non-     ST-elevation myocardial infarction.  11.      Status post stent and thrombectomy of the right coronary artery x2     in May of 2002.  12.      History of chronic back pain.  13.      History of chronic left leg swelling.   HOSPITAL MANAGEMENT/PROCEDURES:  1. Cardiac catheterization completed on February 21, 2004 by Dr. Emilie Rutter.     Pulsipher.  This revealed severe three-vessel coronary artery disease     with moderately decreased left ventricular systolic function, calculated     at 44%.  2. Intravenous heparin therapy for acute inferior myocardial infarction     treatment.  3. Preliminary arterial evaluation for anticipated cardiac surgery completed     on February 23, 2004.  This included bilateral  carotid Duplex exam, bilateral     Allen's tests and bilateral ankle brachial indices.  4. Initiation of cardiac rehab phase 1.  5. Coronary artery bypass grafting x4 completed on March 01, 2004.  The     following grafts were used:  Left internal mammary artery to the left     anterior descending coronary artery, sequential saphenous vein graft to     the first diagonal branch of the left anterior descending and obtuse     marginal branch of the left circumflex coronary artery, and a saphenous     vein graft to the posterior descending coronary artery.  The greater     saphenous vein was harvested from the left leg.   CONSULTATIONS:  1. Cardiac Rehab.  2. Case Management.  3. Smoking cessation counselor.  4. Diabetes coordinator.   HISTORY OF PRESENT ILLNESS:  [name] is a 55 year old obese diabetic smoker  with a history of coronary artery disease, status post 3 previous myocardial  infarctions in 1998, 2000 and 2002.  The patient had previously placed  stents in the diagonal and right coronary arteries.  On February 21, 2004, the  patient developed severe persistent chest pain that woke him in the morning.  The patient went emergently to the [facility] Emergency Room and had  thrombolytic therapy without evidence of reperfusion.  The patient's peak  CPK was 2642 with an MB of 276.  The patient was then transferred to [facility] for further evaluation and management.   HOSPITAL COURSE:  [name] was transferred from [facility] to [facility] on February 21, 2004.  The patient was seen and evaluated by the  cardiology service for a known history of coronary artery disease, in the  face of a new myocardial infarction.  The patient was stabilized on aspirin,  Plavix, Lopressor, morphine, and intravenous nitroglycerin and heparin.  The  patient was taken emergently to the cardiac catheterization lab and  underwent coronary angiography by Dr. Emilie Rutter. Pulsipher.  This was  completed  on February 21, 2004 and showed severe three-vessel coronary artery disease with  100% occlusion in the proximal right coronary artery.  The patient also had  significant disease in his left coronary arteries.  The patient had  moderately decreased left ventricular systolic function secondary to an  acute myocardial infarction with underlying ischemic heart disease.  His  estimated ejection fraction was 44%.  The patient underwent percutaneous  intervention of the right coronary artery during this procedure.  A  cardiovascular surgery consultation was initiated for evaluation for  coronary artery bypass grafting surgery.   Dr. Gwenith Daily. Tyrone Sage of [facility] responded to the consultation appropriately  and saw and examined the patient on February 22, 2004.  His impression was that  the patient had significant three-vessel coronary artery disease following  acute angioplasty of the right coronary artery which was acutely occluded.  He felt that the patient should recover for approximately 6-7 days from  acute myocardial infarction before proceeding with coronary artery bypass  grafting.  The plan was made for the patient to undergo further preoperative  evaluation with arterial screening and other preparation.   Over the next several hospital days, the patient remained stable on IV  heparin and nitroglycerin therapy.  He underwent preoperative screening with  bilateral carotid Duplex exams which showed no evidence of significant  internal carotid artery stenosis.  The patient had bilateral  Allen's tests  which showed an abnormal left test with decreased Doppler sounds with radial  compression.  The patient's bilateral ABIs were within normal limits with a  right ABI of 1.0 and a left ABI of 0.98.  The patient was also seen in  consultation by the smoking cessation counselor, who offered education and  materials as well as contact information for future reference. Unfortunately, the  patient's surgery was further delayed secondary to other  urgent cardiac cases that required immediate intervention.  The patient  agreed to remain in the hospital until his planned coronary artery bypass  grafting.  The patient remained stable and free of chest pain during this  time.   The patient was then taken to the operating room on March 01, 2004 and  underwent coronary artery  bypass grafting x4 utilizing the left internal  mammary artery to the left anterior descending artery, saphenous vein graft  sequence to the first diagonal and the obtuse marginal branches, saphenous  vein graft to the posterior descending artery.  The greater saphenous vein  was endoscopically harvested and ultimately an open harvest from the left  thigh.  Overall, the patient tolerated this procedure well and was  transferred to the intensive care unit in critical but stable condition.   The patient remained hemodynamically stable postoperatively and on  ventilator support throughout the evening of his operative day.   The patient was successfully weaned from the ventilator on postop day #1.  He was neurologically intact, once awakened from anesthesia.  Overall, the  patient remained stable and progressed through a routine postoperative  course.  His chest tubes were discontinued in a stepwise fashion without  difficulty.  The patient's invasive lines were also discontinued when  appropriate.  The patient began cardiac rehab phase 1 as soon as able.  The  patient was diuresed appropriately for volume overload.  He was initiated on  his home diabetes management regimen.   The patient's postoperative course was somewhat prolonged secondary to  history of chronic lower back pain which required additional pain  medications and other therapies.  Otherwise, the patient was a little slow  to wean from oxygen therapy as well.  This is likely due to his long-term  history of tobacco abuse.  Ultimately, at the time  of discharge planning,  the patient had been weaned from oxygen successfully.   The patient was deemed appropriate for initiation of discharge planning then  on March 07, 2004, postop day #6.  At the time of planning, the patient  remained afebrile for greater than 48 hours.  He had remained  hemodynamically stable with a blood pressure in the 100s over 50s.  His  pulse was in the 60s and regular.  His respirations were 20 and unlabored  with an SPO2 of 93% to 96% on room air.  The patient's weight was at 340  pounds, which was exactly his preoperative weight.  The patient's bedside  glucose measurements within 24 hours had read 118, 174, 141 and 150.  On  physical exam, the patient's heart was in a regular rate and rhythm and he  was in a normal sinus rhythm on telemetry.  His lungs were clear to  auscultation.  His abdomen was obese, soft, nontender, and nondistended with  good bowel sounds.  His extremities revealed trace pedal edema with the left greater than the right, otherwise, were warm and dry.  His incisions were  healing well without evidence of infection.  His sternum was stable.  His  left lower extremity incision had minimal serous drainage and the staples  were intact without any increased erythema.   LABORATORY VALUES AT TIME OF INITIATION OF DISCHARGE PLANNING:  On March 05, 2004, a BMP reads sodium of 135, potassium of 4.3, chloride of 102, CO2 of  25, glucose of 170, BUN of 25, creatinine of 1.2 and calcium of 8.5.  CBC  from that same day reads WBC of 7.5, hemoglobin of 10.6, hematocrit of 31.3  and platelet count of 259,000.   Chest x-ray completed on March 03, 2004 reveals no evidence of pneumothorax.  There is improving aeration in his lungs with decreasing bibasilar  opacities, likely atelectasis.   DISPOSITION:  Mr. Madeira is being discharged to home in improved and stable  condition.   DISCHARGE MEDICATIONS:  1. Aspirin 325 mg daily (new medication).  2.  Metoprolol 12.5 mg twice daily (decreased from home dose).  3. Zocor 80 mg daily.  4. Glyburide 10 mg twice daily (increased from home dose).  5. Actos 45 mg daily.  6. Diclofenac 75 mg twice daily (new medication).  7. Lasix 20 mg daily x5 days.  8. K-Dur or potassium 20 mEq x5 days.  9. Dilaudid 4 mg 1-2 tablets every 4-6 hours as needed for pain.  10.      The patient is instructed to hold his lisinopril until seen by Dr.     Willa Rough in clinic.   DISCHARGE INSTRUCTIONS:  1. Activity:  The patient should avoid driving.  He should avoid heavy     lifting or strenuous activity.  He should continue to walk daily.  He     should continue his breathing exercises daily.  2. Diet:  The patient should follow a low-fat, low-salt, carbohydrate-     modified, medium-calorie diet.  3. Wound care:  The patient may shower.  He should wash his incisions gently     with soap and water.  He should notify the [facility] office if he has any     increased redness, swelling or drainage from his incisions.   FOLLOWUP APPOINTMENTS:  1. The patient is to return to [facility] on Friday, March 12, 2004, to have     his leg staples removed.  Our office will contact the patient with this     exact time.  2. The patient is to follow up with his cardiologist, Dr. Willa Rough,     within 2-4 weeks of discharge.  The patient is     to call [phone] to make this appointment date and time.  3. The patient is to follow up with Dr. Evelene Croon of [facility] within 3-4     weeks of discharge.  The [facility] office will call the patient with this     exact date and time.      Carolyn A. Eustaquio Boyden.                  Evelene Croon, M.D.    CAF/MEDQ  D:  03/07/2004  T:  03/08/2004  Job:  [id number]   cc:   Evelene Croon, M.D.  [address]  [place]  [place] [address]  Fax: [phone]   Willa Rough, M.D.   [place], [place] Wende Crease M.D.

## 2010-12-31 NOTE — H&P (Signed)
NAME:  [name], [name]                          ACCOUNT NO.:  [id number]   MEDICAL RECORD NO.:  [id number]                   PATIENT TYPE:  INP   LOCATION:  2927                                 FACILITY:  [facility]   PHYSICIAN:  Willa Rough, M.D.                  DATE OF BIRTH:  [birth year]   DATE OF ADMISSION:  02/21/2004  DATE OF DISCHARGE:                                HISTORY & PHYSICAL   REASON FOR ADMISSION:  Mr. Butterfield is a 55 year old male with known coronary  artery disease, status post prior myocardial infarctions treated with  stenting of the diagonal and right coronary artery and past medical history  notable for type 2 diabetes mellitus, hypertension, dyslipidemia,  longstanding tobacco smoking, nonsustained ventricular tachycardia-negative  electrophysiology study in 2001, and obesity.  He presented earlier this  morning to [facility] with acute inferior myocardial  infarction.   The patient awoke at approximately 4:30 a.m. with chest pain and associated  profuse diaphoresis.  EMS was activated and the patient was found to have  acute inferior myocardial infarction by electrocardiogram.  He was treated  with T&K within 10 minutes but with no subsequent clinical reperfusion.  Dr.  Willa Rough was contacted and arrangements were made to transfer the  patient for rescue percutaneous coronary intervention.   In the interim, the patient was stabilized with aspirin 300 mg, Plavix,  Lopressor, morphine, intravenous nitroglycerin, and heparin.   Upon arrival, the patient was taken urgently to the cardiac catheterization  and underwent coronary angiography by Dr. Emilie Rutter. Pulsipher.  He was found  to have total occlusion of the right coronary artery stent with evidence of  thrombus.  This was treated with thrombectomy and balloon angioplasty with  rejection to 25% residual stenosis and restoration of TIMI III flow.  Residual anatomy notable for 75% in-stent  restenosis of the diagonal,  multiple 75% LAD lesions, and 90% obtuse marginal.  Left ventriculogram  revealed severe inferior akinesis, calculated ejection fraction of 44%.   The patient is currently hemodynamically stable in the CCU with no  complaints of chest pain.  [facility] has been notified for evaluation of bypass  surgery.   ALLERGIES:  CODEINE.   MEDICATIONS:  1. Voltaren 7.5 mg b.i.d.  2. Zocor 80 mg q.d.  3. Glyburide 10 mg b.i.d.  4. Actos 45 mg q.d.  5. Lisinopril 10 mg q.d.  6. Metoprolol 25 mg b.i.d.  7. Coated aspirin 325 mg q.d.  8. Nitro-Stat spray.   PAST MEDICAL HISTORY:  1. Coronary artery disease.     a. Remote MI/stent to the diagonal [facility]).     b. Non-ST/stent to the right coronary artery in 2001.     c. Status post stent/thrombectomy of right coronary artery x 2 in May of        2002.  2. Nonsustained ventricular tachycardia.     a.  Negative electrophysiology study in 2001  3. Type 2 diabetes mellitus.  4. Hypertension.  5. Dyslipidemia.  6. Tobacco.  7. Obesity.   SOCIAL HISTORY:  The patient lives in [place] with his wife.  He has no  children and is on disability.  He has been smoking approximately two packs  per day for the past 30 years.  Alcohol use is denied.   FAMILY HISTORY:  Mother is deceased at age 46 of emphysema.  Father deceased  at age 65 of sudden death.  Brother deceased in his early 37s of sudden  death.   REVIEW OF SYMPTOMS:  The patient reportedly without any recent complaint of  exertional chest discomfort, dyspnea, orthopnea, PND, fever, chills,  hemoptysis, hematuria, or melena.  He does have persistent lower extremity  edema.  He has heartburn symptoms.  Remaining systems are negative.   PHYSICAL EXAMINATION:  VITAL SIGNS:  Blood pressure 117/64, pulse 70 and  regular, respirations 20, temperature 97.5.  GENERAL:  A 55 year old male who is obese and in no apparent distress.  HEENT:  Normocephalic and atraumatic.   NECK:  Visible carotid pulses.  Soft left carotid bruit.  LUNGS:  Clear to auscultation bilaterally in supine position.  ABDOMEN:  Protuberant and nontender.  EXTREMITIES:  Intact left femoral pulse.  Right groin with sheath in place.  Intact dorsalis pedis pulses with 1+ pedal edema.  There were no focal  deficits.   IMPRESSION:  1. Acute inferior myocardial infarction.     a. Status post rescue percutaneous transluminal coronary angioplasty of        totally occluded right coronary artery with thrombus following failed        lytic therapy.     b. Severe residual disease.     c. Moderate left ventricular dysfunction (ejection fraction 44%).  2. Type 2 diabetes mellitus.  3. Dyslipidemia.  4. Hypertension.  5. Longstanding tobacco.  6. Obesity.   PLAN:  The patient will remain in the CCU for continued management with  aspirin, beta blocker, ACE inhibitor, Zocor, and IV nitroglycerin.  Heparin  will be resumed after sheath pull.  We will resume Glucotrol but discontinue  Actos and check a hemoglobin A1C level.  The patient will also be evaluated  regarding possible surgical revascularization.  We will also refer him for  smoking cessation consult.      Gene Serpe, P.A. [facility]                      Willa Rough, M.D.    GS/MEDQ  D:  02/21/2004  T:  02/21/2004  Job:  [id number]   cc:   Wende Crease, M.D.  [place], [place]

## 2010-12-31 NOTE — Cardiovascular Report (Signed)
[place]  Patient:    [name]                        MRN: [id number] Proc. Date: 09/15/99 Adm. Date:  [id number] Attending:  Junious Silk CC:         Veneda Melter, M.D. [facility]             Madolyn Frieze. Jens Som, M.D. [facility]             Dr. Doyne Keel, [facility]                        Cardiac Catheterization  PROCEDURES PERFORMED: 1. Selective coronary angiography. 2. Percutaneous transluminal coronary angioplasty and stenting of the distal right    coronary artery. 3. Enrollment in the X-CISER X-TRACT trial.  DIAGNOSES: 1. Severe single-vessel coronary artery disease, status post inferior wall    myocardial infarction. 2. History of moderate left ventricular dysfunction.  HISTORY:  [name] is a 55 year old white male who presented status post inferior wall myocardial infarction.  The patient underwent left heart catheterization on September 13, 1999, showing extensive thrombus burden in the distal RCA.  The patient also has moderate disease in the left coronary artery and moderate LV dysfunction with ejection fraction of 33%.  He was treated with ReoPro x 48 hours and presents now for relook angiogram.  TECHNIQUE:  After informed consent was obtained, the patient was brought to the  cardiac catheterization laboratory where both groins were sterilely prepped and  draped.  One percent Lidocaine was used to infiltrate the right groin and a 6-French sheath was placed in the right femoral artery using the modified Seldinger technique.  A 6-French JR-4 catheter was then used to engage the right coronary  artery and selective angiography was performed using manual injections of contrast. This showed moderate disease in the mid section of the RCA of approximately 40% to 50%.  The distal RCA was severely diseased and occluded by thrombus with very faint distal vessel filling via collaterals.  With these findings, we  decided to proceed with percutaneous intervention to the distal RCA.  INTERVENTIONAL PROCEDURE:  The 6-French sheath in the right femoral artery was exchanged over an 8-French sheath and a 8-French JR-4 guide catheter was used to engage the right coronary artery.  The patient had previously been on a ReoPro rip and he was supplemented with Plavix and heparin.  He was then randomized within the X-TRACT trial to treatment with the X-CISER catheter.  A 0.014 inch high-torque  floppy wire was advanced within the 2.5 x 20 mm Ranger balloon, which was used or support into the distal RCA.  Repeat angiography was then performed with the wire in place, showing positioning of this within the true lumen of the vessel and very faint flow to the distal PDA.  The X-CISER catheter was then introduced and four passes were made under fluoroscopic guidance.  Repeat angiography was performed  showing an excellent result with complete resolution of the thrombus burden. This showed an underlying narrowing of 70% to 80% and in the distal RCA this was a long stenosis.  The PDA was a medium caliber vessel with narrowing of 30% to 40% in he mid section.  Two small posteroventricular branches were noted distal to the PDA.  Attention was then turned to the distal RCA.  The 2.5 x 20 mm Ranger balloon was reintroduced  and two inflations performed at 6 atm x 30 seconds.  A 3.5 x 32 mm NIR On-Sox stent was then introduced and deployed in the distal RCA up to the PDA at 12 atm x 66 seconds.  This was then postdilated with a 4.0 x 20 mm Quantum Ranger with two inflations of 16 atm x 60 seconds performed within the distal and proximal segments of the stent.  Mild residual narrowings were noted and the proximal stent was then postdilated further to 20 atm x 60 seconds.  The distal stent appeared to crimp somewhat and a repeat inflation of 12 atm x 33 seconds was performed within the distal segment of  the stent.  Repeat angiography after the administration of intracoronary nitroglycerin showed an excellent result with less than 5% residual narrowing and TIMI-3 flow through the RCA, PDA, and posteroventricular branches. Angiography was performed in various projection, confirming that no distal vessel damage had occurred and there was TIMI-3 flow through the RCA.  The guide catheter was then removed and the sheath secured into position.  The patient was transferred to the floor in stable condition.  FINAL RESULTS:  Successful percutaneous transluminal coronary angioplasty and stenting of the distal right coronary artery with reduction of a 100% narrowing  with thrombus to less than 5% with placement of a 3.5 x 32 mm NIR with Sox stent dilated to 4.2 mm. DD:  09/15/99 TD:  09/15/99 Job: [id number]

## 2010-12-31 NOTE — Cardiovascular Report (Signed)
. [facility]  Patient:    [name], [name]                       MRN: [id number] Proc. Date: 12/19/00 Adm. Date:  [id number] Attending:  Junious Silk CC:         Rudy Jew. Pati Gallo, M.D.  Heart Center, [facility]  Cardiac Catheterization Lab   Cardiac Catheterization  PROCEDURE: 1. Left heart catheterization with coronary angiography and left    ventriculography. 2. Percutaneous intervention of the mid right coronary artery with stent    placement x 2 and AngioJet thrombectomy in the distal right coronary    artery.  CARDIOLOGIST:  Daisey Must, M.D. [facility]  INDICATION:  Mr. Hockey is a 55 year old male with history of previous percutaneous interventions.  He has multiple cardiovascular risk factors including morbid obesity, diabetes, hyperlipidemia, and tobacco abuse.  He presented to the office yesterday after a brief episode of chest pain at home. He was admitted to the hospital where he ruled in for a non-Q-wave myocardial infarction.  We opted to proceed with cardiac catheterization.   CARDIAC CATHETERIZATION PROCEDURAL NOTE:  A 6-French sheath was placed in the right femoral artery.  Standard Judkins 6-French catheters were utilized. Contrast was Omnipaque.  There were no complications.  RESULTS:  HEMODYNAMICS:  Left ventricular pressure 136/28, aortic pressure 142/90. There was no aortic valve gradient.  LEFT VENTRICULOGRAM:  There was very mild hypokinesis of the apical wall and inferoapical walls.  Ejection fraction calculated at 58%.  No mitral regurgitation.  CORONARY ARTERIOGRAPHY (right dominant)  Left main is normal.  Left anterior descending artery has a 40% stenosis in the proximal vessel, diffuse 60% stenosis in the mid vessel.  There is a large first diagonal branch which has a stent in the proximal vessel, and the stent is widely patent with less than 10% restenosis within the stent.  There is a small second  diagonal branch.  Left circumflex gives rise to a small first marginal, normal size second marginal, and a small third marginal.  The second marginal had a 60% stenosis proximally.  The right coronary artery is a dominant vessel.  There is a 50% stenosis in the proximal vessel.  The mid vessel has a diffuse 60% followed by a discrete 80% stenosis with a filling defect consistent with thrombus.  There is a stent in the distal vessel which is widely patent with less than 10% stenosis within the stent.  The distal right coronary artery gives rise to a large posterior descending artery and two small posterolateral branches.  IMPRESSION: 1. Left ventricular systolic function in the lower range of normal. 2. Three-vessel coronary artery disease with moderate disease in the    left anterior descending artery and left circumflex which appears to be    stable and not flow limiting.  The culprit lesion appears to be the 80%    stenosis with thrombus in the mid right coronary artery.  PLAN:  Percutaneous intervention of the right coronary artery.  See below.  PTCA PROCEDURE NOTE:  Following completion of the diagnostic catheterization, we proceeded with coronary intervention.  Heparin and Integrilin were administered per protocol.  We exchanged the sheath in the right femoral artery for a 7-French sheath.  We used a 7-French JR4 guiding catheter and initially used a BMW wire.  His starting ACT was actually over 300 seconds. We directly placed a 3.5 x 12 mm NIR stent in the mid  vessel, deploying this at 14 atmospheres.  Following stent deployment, angiographic images revealed the proximal portion of the stent appearing to be under deployed.  In addition, just proximal to the stent, there was a filling defect in the vessel consistent with possible thrombus versus dissection.  We did an intravascular ultrasound which confirmed that the stent was inadequately expanded in the proximal portion, and  there did appear to be a possible dissection in the proximal vessel.  We also could not rule out thrombus.  We, therefore, placed a second stent just proximal to the first stent with slight overlap of the first stent.  This was a 3.5 x 18 mm NIR stent which was deployed at 12 atmospheres.  Both these stents were then post dilated with a 3.75 x 20 mm Quantum balloon inflated to 18, then 20, then 28 atmospheres.  Angiographic images at that point revealed presence of thrombus in the vessel just distal to the stents.  This had likely embolized from the primary lesion.  At that point, we opted to proceed with AngioJet thrombectomy of the distal right coronary artery.  We placed a 6-French sheath in the right femoral vein and a temporary pacemaker in the right ventricular apex.  We advanced an extra support wire into the distal right coronary artery.  AngioJet thrombectomy was performed to the distal vessel.  This did successfully remove the majority of the clot from the distal vessel.  Angiographic images at that point revealed the very distal posterior descending artery to be occluded by small embolism. The vessel beyond that was a very small vessel.  We then did one more post dilatation of the more proximal stent, positioning a 4.0 x 15 mm Quantum balloon in the proximal edge of this stent, inflating it to 20 atmospheres. Final angiographic images revealed patency of the right coronary artery with 0% residual stenosis in the mid vessel and TIMI-3 flow into the distal vessel. At that point, the patient was pain free and hemodynamically stable.  As described above, there was an embolic occlusion of the very distal posterior descending artery which was a tiny vessel.  RESULTS:  Complex but successful stent placement x 2 in the mid right coronary artery with AngioJet thrombectomy in the distal vessel.  A diffuse 60%  followed by an 80% stenosis was reduced to 0% residual with TIMI-3  flow.  PLAN:  Integrilin will be continued for 25 hours.  Plavix will be administered for eight weeks.  We will cycle cardiac enzymes to rule out any additional myocardial damage.  The patient will be observed in the hospital for an additional 48 hours.  He needs very aggressive risk factor modification to decrease his risk of future events. DD:  12/19/00 TD:  12/19/00 Job: [id number]

## 2011-01-07 ENCOUNTER — Other Ambulatory Visit: Payer: Self-pay | Admitting: *Deleted

## 2011-01-07 MED ORDER — HYDROCODONE-ACETAMINOPHEN 10-500 MG PO TABS: 1.0000 | ORAL_TABLET | ORAL | Status: AC | PRN

## 2011-01-07 NOTE — Telephone Encounter (Signed)
Rx called to [facility] pharmacy. 

## 2011-01-11 ENCOUNTER — Encounter: Payer: Self-pay | Admitting: Family Medicine

## 2011-01-11 ENCOUNTER — Telehealth: Payer: Self-pay | Admitting: *Deleted

## 2011-01-11 ENCOUNTER — Ambulatory Visit (INDEPENDENT_AMBULATORY_CARE_PROVIDER_SITE_OTHER): Payer: Medicare Other | Admitting: Family Medicine

## 2011-01-11 VITALS — BP 120/62 | HR 60 | Temp 98.6°F | Wt 190.8 lb

## 2011-01-11 DIAGNOSIS — M542 Cervicalgia: Secondary | ICD-10-CM

## 2011-01-11 MED ORDER — METAXALONE 800 MG PO TABS: 800.0000 mg | ORAL_TABLET | Freq: Three times a day (TID) | ORAL | Status: AC

## 2011-01-11 MED ORDER — CYCLOBENZAPRINE HCL 10 MG PO TABS: 10.0000 mg | ORAL_TABLET | Freq: Three times a day (TID) | ORAL | Status: DC | PRN

## 2011-01-11 NOTE — Assessment & Plan Note (Signed)
New. No red flag symptoms- no radiculopathy or weakness. Likely muscle spasm.  Will try Skelaxin(advies NOT to take with narcotics) and follow up with me at the end of this week. Red flags given requiring immediate imaging and neurosurgery consultation, such as radiculopathy and or weakness.

## 2011-01-11 NOTE — Telephone Encounter (Signed)
Flexeril sent to his pharmacy

## 2011-01-11 NOTE — Telephone Encounter (Signed)
Pharmacist says that skelaxin is not covered by patients insurance and is asking if he can be switched to something else. Please advise.
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
## 2011-01-11 NOTE — Telephone Encounter (Signed)
Notified pharmacy via message left on voicemail that Rx for Flexeril was sent electronically.

## 2011-01-12 ENCOUNTER — Other Ambulatory Visit: Payer: Medicare Other | Admitting: Gastroenterology

## 2011-01-14 ENCOUNTER — Telehealth: Payer: Self-pay | Admitting: *Deleted

## 2011-01-14 NOTE — Telephone Encounter (Signed)
Pt called to report that he is feeling 75% better than he was the other day.

## 2011-01-14 NOTE — Telephone Encounter (Signed)
Left message on cell phone voicemail advising patient as instructed.   

## 2011-01-14 NOTE — Telephone Encounter (Signed)
Good.  Thanks for keep [place] posted.  Let us know how he is feeling next week.

## 2011-01-24 ENCOUNTER — Other Ambulatory Visit: Payer: Self-pay | Admitting: Family Medicine

## 2011-01-24 NOTE — Telephone Encounter (Signed)
Rx called to pharmacy

## 2011-01-28 ENCOUNTER — Encounter: Payer: Self-pay | Admitting: Family Medicine

## 2011-02-02 ENCOUNTER — Ambulatory Visit (INDEPENDENT_AMBULATORY_CARE_PROVIDER_SITE_OTHER): Payer: Medicare Other | Admitting: Family Medicine

## 2011-02-02 ENCOUNTER — Encounter: Payer: Self-pay | Admitting: Family Medicine

## 2011-02-02 VITALS — BP 102/60 | HR 60 | Temp 98.3°F | Wt 189.2 lb

## 2011-02-02 DIAGNOSIS — I10 Essential (primary) hypertension: Secondary | ICD-10-CM

## 2011-02-02 DIAGNOSIS — E785 Hyperlipidemia, unspecified: Secondary | ICD-10-CM

## 2011-02-02 DIAGNOSIS — M542 Cervicalgia: Secondary | ICD-10-CM

## 2011-02-02 DIAGNOSIS — M545 Low back pain, unspecified: Secondary | ICD-10-CM

## 2011-02-02 MED ORDER — ALPRAZOLAM 1 MG PO TABS: 1.0000 mg | ORAL_TABLET | Freq: Three times a day (TID) | ORAL | Status: DC | PRN

## 2011-02-02 MED ORDER — HYDROCODONE-ACETAMINOPHEN 10-500 MG PO TABS: 1.0000 | ORAL_TABLET | Freq: Four times a day (QID) | ORAL | Status: DC | PRN

## 2011-02-02 NOTE — Progress Notes (Signed)
55 yo here for 3 month follow up.  Neck pain improved with muscle relaxants.  Still a little stiff, but much better. No tingling in fingers.  Has been doing ok.    Not having panic attacks.   Only taking Alprazolam 1 mg at bedtime.  Denies any symptoms of depression.     DM- has been diet controlled since he gastric bypass in 2006 (lost 200 lb).  has lost a few more pounds in last 3 months. Denies any symptoms of hyper or hypo glycemia. Lab Results  Component Value Date   HGBA1C 6.0 10/06/2010     HLD-  On Zocor 20 mg daily.  Never had issues with myalgias.  Well controlled.   Lab Results  Component Value Date   HDL 36.30* 10/06/2010   HDL 44.60 09/03/2009   Lab Results  Component Value Date   LDLCALC 58 10/06/2010   LDLCALC 83 09/03/2009   Lab Results  Component Value Date   TRIG 46.0 10/06/2010   TRIG 74.0 09/03/2009      Patient Active Problem List  Diagnoses  . DIABETES MELLITUS, TYPE II  . VITAMIN D DEFICIENCY  . HYPERLIPIDEMIA  . ANEMIA-IRON DEFICIENCY  . TOBACCO ABUSE  . ADJUSTMENT DISORDER WITH ANXIETY  . HYPERTENSION  . MYOCARDIAL INFARCTION, HX OF  . CAD, ARTERY BYPASS GRAFT  . CARDIOMYOPATHY, ISCHEMIC  . LOW BACK PAIN  . PULMONARY EMBOLISM, HX OF  . Neck pain   Past Medical History  Diagnosis Date  . Diabetes mellitus   . Hyperlipidemia   . Hypertension   . Low back pain   . MI (myocardial infarction)   . Arthritis   . Tobacco abuse   . Left ventricular dysfunction   . Obesity   . CAD (coronary artery disease)   . PE (pulmonary embolism)   . Anemia    Past Surgical History  Procedure Date  . Coronary stent placement     3 all in right side  . Gastric bypass   . Coronary artery bypass graft   . Cholecystectomy   . Hernia repair    History  Substance Use Topics  . Smoking status: Current Everyday Smoker -- 1 years  . Smokeless tobacco: Not on file  . Alcohol Use: No   Family History  Problem Relation Age of Onset  . Emphysema Mother     . Heart attack Father   . Heart disease Daughter   . Heart disease Brother   . Arthritis Brother   . Hemophilia Brother    Allergies  Allergen Reactions  . Codeine     REACTION: makes me "looney"   Current Outpatient Prescriptions on File Prior to Visit  Medication Sig Dispense Refill  . ALPRAZolam (XANAX) 1 MG tablet TAKE ONE TABLET BY MOUTH THREE TIMES DAILY ASNEEDED FOR ANXIETY  90 tablet  0  . aspirin 81 MG tablet Take 81 mg by mouth daily.        Marland Kitchen atorvastatin (LIPITOR) 40 MG tablet Take 40 mg by mouth daily.        . Calcium Citrate-Vitamin D 500-400 MG-UNIT CHEW Chew 1 tablet by mouth 2 (two) times daily.        . Cholecalciferol (VITAMIN D) 2000 UNITS tablet Take 2,000 Units by mouth daily.        Marland Kitchen docusate sodium (COLACE) 100 MG capsule Take 100 mg by mouth as needed.        Marland Kitchen FLEXERIL 10 MG tablet TAKE 1 TABLET  EVERY 8    HOURS AS NEEDED FORMUSCLE SPASM  30 each  0  . HYDROcodone-acetaminophen (LORTAB) 10-500 MG per tablet Take 1 tablet by mouth 4 (four) times daily as needed.        . metaxalone (SKELAXIN) 800 MG tablet Take 1 tablet (800 mg total) by mouth 3 (three) times daily.  90 tablet  1  . metoprolol succinate (TOPROL-XL) 25 MG 24 hr tablet Take 25 mg by mouth 2 (two) times daily.        . Multiple Vitamin (MULTIVITAMIN) tablet Take 1 tablet by mouth daily.        . nitroGLYCERIN (NITROSTAT) 0.3 MG SL tablet Place 0.3 mg under the tongue every 5 (five) minutes as needed.         The PMH, PSH, Social History, Family History, Medications, and allergies have been reviewed in [facility], and have been updated if relevant.   Review of Systems       See HPI General:  Denies malaise. Eyes:  Denies blurring. CV:  Denies chest pain or discomfort. Resp:  Denies shortness of breath. Psych:  Denies panic attacks, sense of great danger, suicidal thoughts/plans, thoughts of violence, unusual visions or sounds, and thoughts /plans of harming others.  Physical Exam BP 102/60   Pulse 60  Temp(Src) 98.3 F (36.8 C) (Oral)  Wt 189 lb 4 oz (85.843 kg)  General:  Well-developed,well-nourished,in no acute distress; alert,appropriate and cooperative throughout examination Lungs:  Normal respiratory effort, chest expands symmetrically. Lungs are clear to auscultation, no crackles or wheezes. Heart:  Normal rate and regular rhythm. S1 and S2 normal without gallop, murmur, click, rub or other extra sounds. Psych:  Cognition and judgment appear intact. Alert and cooperative with normal attention span and concentration. No apparent delusions, illusions, hallucinations  1. Neck pain  Improved.  Continue as needed flexeril  2. LOW BACK PAIN  Stable.  NO signs of narcotic abuse. Refilled Lortab today.  3. HYPERTENSION  Stable.   Continue current medications.  4. HYPERLIPIDEMIA  Stable.Continue Lipitor 40 mg.

## 2011-04-08 ENCOUNTER — Other Ambulatory Visit: Payer: Self-pay | Admitting: *Deleted

## 2011-04-08 MED ORDER — HYDROCODONE-ACETAMINOPHEN 10-500 MG PO TABS: 1.0000 | ORAL_TABLET | Freq: Four times a day (QID) | ORAL | Status: DC | PRN

## 2011-04-08 NOTE — Telephone Encounter (Signed)
Last refill 03/08/2011.

## 2011-04-08 NOTE — Telephone Encounter (Signed)
Rx called to [facility] pharmacy. 

## 2011-05-04 ENCOUNTER — Ambulatory Visit (INDEPENDENT_AMBULATORY_CARE_PROVIDER_SITE_OTHER): Payer: Medicare Other | Admitting: Family Medicine

## 2011-05-04 ENCOUNTER — Encounter: Payer: Self-pay | Admitting: Family Medicine

## 2011-05-04 VITALS — BP 92/62 | HR 50 | Temp 98.1°F | Wt 191.0 lb

## 2011-05-04 DIAGNOSIS — I2589 Other forms of chronic ischemic heart disease: Secondary | ICD-10-CM

## 2011-05-04 DIAGNOSIS — Z23 Encounter for immunization: Secondary | ICD-10-CM

## 2011-05-04 DIAGNOSIS — E785 Hyperlipidemia, unspecified: Secondary | ICD-10-CM

## 2011-05-04 DIAGNOSIS — E119 Type 2 diabetes mellitus without complications: Secondary | ICD-10-CM

## 2011-05-04 DIAGNOSIS — M545 Low back pain, unspecified: Secondary | ICD-10-CM

## 2011-05-04 DIAGNOSIS — I10 Essential (primary) hypertension: Secondary | ICD-10-CM

## 2011-05-04 MED ORDER — HYDROCODONE-ACETAMINOPHEN 10-500 MG PO TABS: 1.0000 | ORAL_TABLET | Freq: Four times a day (QID) | ORAL | Status: DC | PRN

## 2011-05-04 MED ORDER — ALPRAZOLAM 1 MG PO TABS: 1.0000 mg | ORAL_TABLET | Freq: Three times a day (TID) | ORAL | Status: DC | PRN

## 2011-05-04 NOTE — Patient Instructions (Signed)
Great to see you. Come back for your physical at first of the year.

## 2011-05-04 NOTE — Progress Notes (Signed)
55 yo here for 3 month follow up.  Has been doing ok.    Not having panic attacks.   Only taking Alprazolam 1 mg at bedtime.  Denies any symptoms of depression.   Daughter still on heart transplant list, but he is trying to take it one day at a time.   DM- has been diet controlled since he gastric bypass in 2006 (lost 200 lb).    Weight stable.   Wt Readings from Last 3 Encounters:  05/04/11 191 lb (86.637 kg)  02/02/11 189 lb 4 oz (85.843 kg)  01/11/11 190 lb 12.8 oz (86.546 kg)   Denies any symptoms of hyper or hypo glycemia. Lab Results  Component Value Date   HGBA1C 6.0 10/06/2010     HLD-  On Zocor 20 mg daily.  Never had issues with myalgias.  Well controlled.   Lab Results  Component Value Date   HDL 36.30* 10/06/2010   HDL 44.60 09/03/2009   Lab Results  Component Value Date   LDLCALC 58 10/06/2010   LDLCALC 83 09/03/2009   Lab Results  Component Value Date   TRIG 46.0 10/06/2010   TRIG 74.0 09/03/2009      Patient Active Problem List  Diagnoses  . DIABETES MELLITUS, TYPE II  . VITAMIN D DEFICIENCY  . HYPERLIPIDEMIA  . ANEMIA-IRON DEFICIENCY  . TOBACCO ABUSE  . ADJUSTMENT DISORDER WITH ANXIETY  . HYPERTENSION  . MYOCARDIAL INFARCTION, HX OF  . CAD, ARTERY BYPASS GRAFT  . CARDIOMYOPATHY, ISCHEMIC  . LOW BACK PAIN  . PULMONARY EMBOLISM, HX OF  . Neck pain   Past Medical History  Diagnosis Date  . Diabetes mellitus   . Hyperlipidemia   . Hypertension   . Low back pain   . MI (myocardial infarction)   . Arthritis   . Tobacco abuse   . Left ventricular dysfunction   . Obesity   . CAD (coronary artery disease)   . PE (pulmonary embolism)   . Anemia    Past Surgical History  Procedure Date  . Coronary stent placement     3 all in right side  . Gastric bypass   . Coronary artery bypass graft   . Cholecystectomy   . Hernia repair    History  Substance Use Topics  . Smoking status: Current Everyday Smoker -- 1 years  . Smokeless tobacco: Not on  file  . Alcohol Use: No   Family History  Problem Relation Age of Onset  . Emphysema Mother   . Heart attack Father   . Heart disease Daughter   . Heart disease Brother   . Arthritis Brother   . Hemophilia Brother    Allergies  Allergen Reactions  . Codeine     REACTION: makes me "looney"   Current Outpatient Prescriptions on File Prior to Visit  Medication Sig Dispense Refill  . ALPRAZolam (XANAX) 1 MG tablet Take 1 tablet (1 mg total) by mouth 3 (three) times daily as needed for sleep.  90 tablet  0  . aspirin 81 MG tablet Take 81 mg by mouth daily.        Marland Kitchen atorvastatin (LIPITOR) 40 MG tablet Take 40 mg by mouth daily.        . Calcium Citrate-Vitamin D 500-400 MG-UNIT CHEW Chew 1 tablet by mouth 2 (two) times daily.        . Cholecalciferol (VITAMIN D) 2000 UNITS tablet Take 2,000 Units by mouth daily.        Marland Kitchen  docusate sodium (COLACE) 100 MG capsule Take 100 mg by mouth as needed.        Marland Kitchen FLEXERIL 10 MG tablet TAKE 1 TABLET EVERY 8    HOURS AS NEEDED FORMUSCLE SPASM  30 each  0  . HYDROcodone-acetaminophen (LORTAB) 10-500 MG per tablet Take 1 tablet by mouth 4 (four) times daily as needed.  120 tablet  0  . metaxalone (SKELAXIN) 800 MG tablet Take 1 tablet (800 mg total) by mouth 3 (three) times daily.  90 tablet  1  . metoprolol succinate (TOPROL-XL) 25 MG 24 hr tablet Take 25 mg by mouth 2 (two) times daily.        . Multiple Vitamin (MULTIVITAMIN) tablet Take 1 tablet by mouth daily.        . nitroGLYCERIN (NITROSTAT) 0.3 MG SL tablet Place 0.3 mg under the tongue every 5 (five) minutes as needed.         The PMH, PSH, Social History, Family History, Medications, and allergies have been reviewed in [facility], and have been updated if relevant.   Review of Systems       See HPI General:  Denies malaise. Eyes:  Denies blurring. CV:  Denies chest pain or discomfort. Resp:  Denies shortness of breath. Psych:  Denies panic attacks, sense of great danger, suicidal  thoughts/plans, thoughts of violence, unusual visions or sounds, and thoughts /plans of harming others.  Physical Exam BP 92/62  Pulse 50  Temp(Src) 98.1 F (36.7 C) (Oral)  Wt 191 lb (86.637 kg)  General:  Well-developed,well-nourished,in no acute distress; alert,appropriate and cooperative throughout examination Lungs:  Normal respiratory effort, chest expands symmetrically. Lungs are clear to auscultation, no crackles or wheezes. Heart:  Normal rate and regular rhythm. S1 and S2 normal without gallop, murmur, click, rub or other extra sounds. Psych:  Cognition and judgment appear intact. Alert and cooperative with normal attention span and concentration. No apparent delusions, illusions, hallucinations  1. Neck pain  IResovled.  2. LOW BACK PAIN  Stable.  NO signs of narcotic abuse. Refilled Lortab today.  3. HYPERTENSION  Stable.   Continue current medications.  4. HYPERLIPIDEMIA  Stable.Continue Lipitor 40 mg. Recheck lipids at [facility] in January.

## 2011-05-16 LAB — URINALYSIS, ROUTINE W REFLEX MICROSCOPIC
Glucose, UA: NEGATIVE
Hgb urine dipstick: NEGATIVE
Ketones, ur: 15 — AB
Specific Gravity, Urine: >1.03 — ABNORMAL HIGH
pH: 6

## 2011-05-16 LAB — COMPREHENSIVE METABOLIC PANEL
ALT: 12
ALT: 19
AST: 18
AST: 19
Alkaline Phosphatase: 46
Alkaline Phosphatase: 56
BUN: 11
BUN: 6
CO2: 29
CO2: 33 — ABNORMAL HIGH
CO2: 33 — ABNORMAL HIGH
Calcium: 9
Calcium: 9
Calcium: 9.1
Chloride: 105
Creatinine, Ser: 0.95
GFR calc Af Amer: >60
GFR calc Af Amer: >60
GFR calc non Af Amer: >60
GFR calc non Af Amer: >60
GFR calc non Af Amer: >60
Glucose, Bld: 100 — ABNORMAL HIGH
Glucose, Bld: 131 — ABNORMAL HIGH
Potassium: 3.4 — ABNORMAL LOW
Sodium: 142
Total Bilirubin: 0.8
Total Protein: 6.4

## 2011-05-16 LAB — CBC
HCT: 39.1
HCT: 40.1
Hemoglobin: 13.1
Hemoglobin: 13.8
MCHC: 34.2
MCHC: 34.3
MCHC: 34.7
MCV: 93.9
MCV: 94.3
RBC: 4.02 — ABNORMAL LOW
RBC: 4.15 — ABNORMAL LOW
RBC: 4.27
RDW: 13.8
WBC: 5.9
WBC: 6.6

## 2011-05-16 LAB — DIFFERENTIAL
Basophils Absolute: 0.1
Basophils Relative: 1
Basophils Relative: 1
Eosinophils Absolute: 0.1
Eosinophils Absolute: 0.2
Eosinophils Relative: 2
Eosinophils Relative: 3
Lymphocytes Relative: 32
Lymphs Abs: 2
Lymphs Abs: 2.1
Lymphs Abs: 2.3
Monocytes Relative: 8
Monocytes Relative: 9
Neutro Abs: 3.7
Neutrophils Relative %: 56
Neutrophils Relative %: 62

## 2011-05-16 LAB — POCT CARDIAC MARKERS: Myoglobin, poc: 80

## 2011-05-16 LAB — BASIC METABOLIC PANEL
CO2: 33 — ABNORMAL HIGH
Calcium: 9.3
Creatinine, Ser: 0.9
GFR calc Af Amer: >60
GFR calc non Af Amer: >60
Glucose, Bld: 118 — ABNORMAL HIGH

## 2011-05-16 LAB — HEMOGLOBIN A1C
Hgb A1c MFr Bld: 5.4
Mean Plasma Glucose: 108

## 2011-05-16 LAB — LIPASE, BLOOD: Lipase: 23

## 2011-06-07 ENCOUNTER — Other Ambulatory Visit: Payer: Self-pay | Admitting: Family Medicine

## 2011-06-07 ENCOUNTER — Other Ambulatory Visit: Payer: Self-pay | Admitting: *Deleted

## 2011-06-07 MED ORDER — HYDROCODONE-ACETAMINOPHEN 10-500 MG PO TABS: 1.0000 | ORAL_TABLET | Freq: Four times a day (QID) | ORAL | Status: DC | PRN

## 2011-06-07 NOTE — Telephone Encounter (Signed)
Last refill 05/09/2011.

## 2011-06-07 NOTE — Telephone Encounter (Signed)
Yes, Rx was called to pharmacy today.  Duplicate request.

## 2011-06-07 NOTE — Telephone Encounter (Signed)
Rx called to [facility] pharmacy. 

## 2011-06-07 NOTE — Telephone Encounter (Signed)
Didn't we just refill his naracotic?

## 2011-06-22 ENCOUNTER — Other Ambulatory Visit: Payer: Self-pay | Admitting: Family Medicine

## 2011-06-23 NOTE — Telephone Encounter (Signed)
Rx called to [facility].

## 2011-07-06 ENCOUNTER — Other Ambulatory Visit: Payer: Self-pay | Admitting: Family Medicine

## 2011-07-06 NOTE — Telephone Encounter (Signed)
Rx called to [facility] pharmacy. 

## 2011-08-05 ENCOUNTER — Other Ambulatory Visit: Payer: Self-pay | Admitting: Family Medicine

## 2011-08-05 ENCOUNTER — Other Ambulatory Visit: Payer: Self-pay | Admitting: *Deleted

## 2011-08-05 MED ORDER — ALPRAZOLAM 1 MG PO TABS: 1.0000 mg | ORAL_TABLET | Freq: Three times a day (TID) | ORAL | Status: DC | PRN

## 2011-08-05 MED ORDER — HYDROCODONE-ACETAMINOPHEN 10-500 MG PO TABS: 1.0000 | ORAL_TABLET | Freq: Four times a day (QID) | ORAL | Status: DC | PRN

## 2011-08-05 NOTE — Telephone Encounter (Signed)
Rx's called to [facility].

## 2011-09-05 ENCOUNTER — Other Ambulatory Visit: Payer: Self-pay | Admitting: Family Medicine

## 2011-09-06 NOTE — Telephone Encounter (Signed)
Medication phoned to pharmacy.  

## 2011-10-10 ENCOUNTER — Ambulatory Visit (INDEPENDENT_AMBULATORY_CARE_PROVIDER_SITE_OTHER): Payer: Medicare Other | Admitting: Family Medicine

## 2011-10-10 ENCOUNTER — Encounter: Payer: Self-pay | Admitting: Family Medicine

## 2011-10-10 ENCOUNTER — Other Ambulatory Visit: Payer: Self-pay | Admitting: Family Medicine

## 2011-10-10 VITALS — BP 110/60 | HR 52 | Temp 97.9°F | Wt 190.0 lb

## 2011-10-10 DIAGNOSIS — E785 Hyperlipidemia, unspecified: Secondary | ICD-10-CM

## 2011-10-10 DIAGNOSIS — I252 Old myocardial infarction: Secondary | ICD-10-CM

## 2011-10-10 DIAGNOSIS — E119 Type 2 diabetes mellitus without complications: Secondary | ICD-10-CM

## 2011-10-10 DIAGNOSIS — Z125 Encounter for screening for malignant neoplasm of prostate: Secondary | ICD-10-CM

## 2011-10-10 DIAGNOSIS — I25810 Atherosclerosis of coronary artery bypass graft(s) without angina pectoris: Secondary | ICD-10-CM

## 2011-10-10 DIAGNOSIS — Z0000 Encounter for general adult medical examination without abnormal findings: Secondary | ICD-10-CM

## 2011-10-10 LAB — BASIC METABOLIC PANEL
BUN: 18 mg/dL (ref 6–23)
Creatinine, Ser: 1.2 mg/dL (ref 0.4–1.5)
GFR: 66.34 mL/min (ref >60.00)

## 2011-10-10 LAB — PSA: PSA: 0.38 ng/mL (ref 0.10–4.00)

## 2011-10-10 LAB — LIPID PANEL: Cholesterol: 124 mg/dL (ref 0–200)

## 2011-10-10 MED ORDER — NITROGLYCERIN 0.3 MG SL SUBL: 0.3000 mg | SUBLINGUAL_TABLET | SUBLINGUAL | Status: DC | PRN

## 2011-10-10 MED ORDER — METOPROLOL SUCCINATE ER 25 MG PO TB24: 25.0000 mg | ORAL_TABLET | Freq: Two times a day (BID) | ORAL | Status: DC

## 2011-10-10 MED ORDER — HYDROCODONE-ACETAMINOPHEN 10-500 MG PO TABS: ORAL_TABLET | ORAL | Status: DC

## 2011-10-10 MED ORDER — SIMVASTATIN 40 MG PO TABS: 40.0000 mg | ORAL_TABLET | Freq: Every day | ORAL | Status: DC

## 2011-10-10 MED ORDER — ALPRAZOLAM 1 MG PO TABS: ORAL_TABLET | ORAL | Status: DC

## 2011-10-10 NOTE — Telephone Encounter (Signed)
Rx refilled.

## 2011-10-10 NOTE — Telephone Encounter (Signed)
Pt takes NitroGlycerin and has not had it refilled in a while. He states that he uses it when needed and he hasn't had to use it very often but he still likes to have some just in case.

## 2011-10-10 NOTE — Progress Notes (Signed)
56 yo here for CPX.  Has been doing ok.    Not having panic attacks.   Only taking Alprazolam 1 mg at bedtime.  Denies any symptoms of depression.   Daughter still on heart transplant list, but he is trying to take it one day at a time.   DM- has been diet controlled since he gastric bypass in 2006 (lost 200 lb).    Weight stable.   Lab Results  Component Value Date   HGBA1C 6.0 10/06/2010     Wt Readings from Last 3 Encounters:  10/10/11 190 lb (86.183 kg)  05/04/11 191 lb (86.637 kg)  02/02/11 189 lb 4 oz (85.843 kg)   Denies any symptoms of hyper or hypo glycemia. Lab Results  Component Value Date   HGBA1C 6.0 10/06/2010     HLD-  On Zocor 20 mg daily.  Never had issues with myalgias.  Well controlled.   Lab Results  Component Value Date   HDL 36.30* 10/06/2010   HDL 44.60 09/03/2009   Lab Results  Component Value Date   LDLCALC 58 10/06/2010   LDLCALC 83 09/03/2009   Lab Results  Component Value Date   TRIG 46.0 10/06/2010   TRIG 74.0 09/03/2009   CAD- denies any CP. CAD s/p CABG in 2005- followed by Dr. Excell Seltzer but has not seen him since 2011. Doing well. Reports that his heart is no longer what keeps him from exertion. Can walk for 45 minutes but has to stop due to lower and upper back pain. Denies any SOB, CP, orthopnea, LE edema, presycope or syncope   Patient Active Problem List  Diagnoses  . DIABETES MELLITUS, TYPE II  . VITAMIN D DEFICIENCY  . HYPERLIPIDEMIA  . ANEMIA-IRON DEFICIENCY  . TOBACCO ABUSE  . ADJUSTMENT DISORDER WITH ANXIETY  . HYPERTENSION  . MYOCARDIAL INFARCTION, HX OF  . CAD, ARTERY BYPASS GRAFT  . CARDIOMYOPATHY, ISCHEMIC  . LOW BACK PAIN  . PULMONARY EMBOLISM, HX OF  . Neck pain  . Routine general medical examination at a health care facility   Past Medical History  Diagnosis Date  . Diabetes mellitus   . Hyperlipidemia   . Hypertension   . Low back pain   . MI (myocardial infarction)   . Arthritis   . Tobacco abuse   . Left  ventricular dysfunction   . Obesity   . CAD (coronary artery disease)   . PE (pulmonary embolism)   . Anemia    Past Surgical History  Procedure Date  . Coronary stent placement     3 all in right side  . Gastric bypass   . Coronary artery bypass graft   . Cholecystectomy   . Hernia repair    History  Substance Use Topics  . Smoking status: Current Everyday Smoker -- 1 years  . Smokeless tobacco: Not on file  . Alcohol Use: No   Family History  Problem Relation Age of Onset  . Emphysema Mother   . Heart attack Father   . Heart disease Daughter   . Heart disease Brother   . Arthritis Brother   . Hemophilia Brother    Allergies  Allergen Reactions  . Codeine     REACTION: makes me "looney"   Current Outpatient Prescriptions on File Prior to Visit  Medication Sig Dispense Refill  . ALPRAZolam (XANAX) 1 MG tablet TAKE ONE TABLET BY MOUTH THREE TIMES DAILY AS NEEDED FOR ANXIETY  90 tablet  0  . aspirin 81 MG  tablet Take 81 mg by mouth daily.        Marland Kitchen atorvastatin (LIPITOR) 40 MG tablet Take 40 mg by mouth daily.        . Calcium Citrate-Vitamin D 500-400 MG-UNIT CHEW Chew 1 tablet by mouth 2 (two) times daily.        . Cholecalciferol (VITAMIN D) 2000 UNITS tablet Take 2,000 Units by mouth daily.        Marland Kitchen docusate sodium (COLACE) 100 MG capsule Take 100 mg by mouth as needed.        Marland Kitchen FLEXERIL 10 MG tablet TAKE 1 TABLET EVERY 8    HOURS AS NEEDED FORMUSCLE SPASM  30 each  0  . LORTAB 10 10-500 MG per tablet TAKE (1) TABLET BY MOUTH FOUR TIMES A DAY AS NEEDED FOR PAIN.  120 each  0  . metaxalone (SKELAXIN) 800 MG tablet Take 1 tablet (800 mg total) by mouth 3 (three) times daily.  90 tablet  1  . metoprolol succinate (TOPROL-XL) 25 MG 24 hr tablet Take 25 mg by mouth 2 (two) times daily.        . Multiple Vitamin (MULTIVITAMIN) tablet Take 1 tablet by mouth daily.        . nitroGLYCERIN (NITROSTAT) 0.3 MG SL tablet Place 0.3 mg under the tongue every 5 (five) minutes as  needed.         The PMH, PSH, Social History, Family History, Medications, and allergies have been reviewed in [facility], and have been updated if relevant.   Review of Systems       See HPI General:  Denies malaise. Eyes:  Denies blurring. CV:  Denies chest pain or discomfort. Resp:  Denies shortness of breath. Psych:  Denies panic attacks, sense of great danger, suicidal thoughts/plans, thoughts of violence, unusual visions or sounds, and thoughts /plans of harming others.  Physical Exam BP 110/60  Pulse 52  Temp(Src) 97.9 F (36.6 C) (Oral)  Wt 190 lb (86.183 kg) General:  overweght male in NAD Eyes:  PERRL Ears:  External ear exam shows no significant lesions or deformities.  Otoscopic examination reveals clear canals, tympanic membranes are intact bilaterally without bulging, retraction, inflammation or discharge. Hearing is grossly normal bilaterally. Nose:  External nasal examination shows no deformity or inflammation. Nasal mucosa are pink and moist without lesions or exudates. Mouth:  Oral mucosa and oropharynx without lesions or exudates.  Teeth in good repair. Neck:  no carotid bruit or thyromegaly no cervical or supraclavicular lymphadenopathy  Lungs:  Normal respiratory effort, chest expands symmetrically. Lungs are clear to auscultation, no crackles or wheezes. Heart:  Normal rate and regular rhythm. S1 and S2 normal without gallop, murmur, click, rub or other extra sounds. Abdomen:  Bowel sounds positive,abdomen soft and non-tender without masses, organomegaly or hernias noted. Pulses:  R and L posterior tibial pulses are full and equal bilaterally  Extremities:  no edema    1. Annual visit-  Reviewed preventive care protocols, scheduled due services, and updated immunizations Discussed nutrition, exercise, diet, and healthy lifestyle.  Declining colonoscopy at this time, he wants to get it done in a few months. Orders Placed This Encounter  Procedures  . Basic  Metabolic Panel  . Lipid Panel  . Hemoglobin A1c  . PSA  . EKG 12-Lead     2. LOW BACK PAIN  Stable.  NO signs of narcotic abuse. Refilled Lortab today.  3. HYPERTENSION  Stable.   Continue current medications.  4. HYPERLIPIDEMIA  Stable.Continue simvastatin 40 mg daily Recheck lipids today.   5.  CAD-  Stable, EKG unchanged. Recommended routine f/iu with Dr. Copper. The patient indicates understanding of these issues and agrees with the plan.

## 2011-10-10 NOTE — Patient Instructions (Signed)
Good to see you. We will set up your colonoscopy and your cardiology visit as we discussed (at next office visit). If you change your mind and would like to have them done sooner, please let me know.

## 2011-11-07 ENCOUNTER — Other Ambulatory Visit: Payer: Self-pay | Admitting: *Deleted

## 2011-11-07 ENCOUNTER — Other Ambulatory Visit: Payer: Self-pay | Admitting: Family Medicine

## 2011-11-07 MED ORDER — HYDROCODONE-ACETAMINOPHEN 10-500 MG PO TABS: ORAL_TABLET | ORAL | Status: DC

## 2011-11-07 MED ORDER — ALPRAZOLAM 1 MG PO TABS: ORAL_TABLET | ORAL | Status: DC

## 2011-11-07 NOTE — Telephone Encounter (Signed)
Ok to refill? Please send back to [place].

## 2011-11-07 NOTE — Telephone Encounter (Signed)
Meds called to pharmacy 

## 2011-12-06 ENCOUNTER — Other Ambulatory Visit: Payer: Self-pay | Admitting: *Deleted

## 2011-12-06 MED ORDER — HYDROCODONE-ACETAMINOPHEN 10-500 MG PO TABS: ORAL_TABLET | ORAL | Status: DC

## 2011-12-06 MED ORDER — ALPRAZOLAM 1 MG PO TABS: ORAL_TABLET | ORAL | Status: DC

## 2011-12-06 NOTE — Telephone Encounter (Signed)
Faxed requests from [facility], last filled dates 11/07/11 on both.

## 2011-12-07 NOTE — Telephone Encounter (Signed)
Medicines called to [facility].

## 2011-12-27 ENCOUNTER — Encounter: Payer: Self-pay | Admitting: Family Medicine

## 2011-12-27 ENCOUNTER — Ambulatory Visit (INDEPENDENT_AMBULATORY_CARE_PROVIDER_SITE_OTHER): Payer: Medicare Other | Admitting: Family Medicine

## 2011-12-27 VITALS — BP 100/60 | HR 52 | Temp 97.9°F | Wt 189.0 lb

## 2011-12-27 DIAGNOSIS — H6690 Otitis media, unspecified, unspecified ear: Secondary | ICD-10-CM | POA: Insufficient documentation

## 2011-12-27 DIAGNOSIS — I10 Essential (primary) hypertension: Secondary | ICD-10-CM

## 2011-12-27 DIAGNOSIS — E785 Hyperlipidemia, unspecified: Secondary | ICD-10-CM

## 2011-12-27 MED ORDER — FEXOFENADINE HCL 180 MG PO TABS: 180.0000 mg | ORAL_TABLET | Freq: Every day | ORAL | Status: DC

## 2011-12-27 MED ORDER — ATORVASTATIN CALCIUM 40 MG PO TABS: 20.0000 mg | ORAL_TABLET | Freq: Every day | ORAL | Status: DC

## 2011-12-27 MED ORDER — AMOXICILLIN 500 MG PO CAPS: 500.0000 mg | ORAL_CAPSULE | Freq: Two times a day (BID) | ORAL | Status: AC

## 2011-12-27 MED ORDER — ALPRAZOLAM 1 MG PO TABS: ORAL_TABLET | ORAL | Status: DC

## 2011-12-27 MED ORDER — HYDROCODONE-ACETAMINOPHEN 10-500 MG PO TABS: ORAL_TABLET | ORAL | Status: DC

## 2011-12-27 MED ORDER — SIMVASTATIN 40 MG PO TABS: 20.0000 mg | ORAL_TABLET | Freq: Every day | ORAL | Status: DC

## 2011-12-27 NOTE — Patient Instructions (Addendum)
Take the amoxicillin as directed- 1 tablet twice daily x 10 days. Start Allegra as directed. Call me if no improvement in your symptoms in a week or so.

## 2011-12-27 NOTE — Progress Notes (Signed)
56 yo here for 3 months follow up.  Left ear pain- seasonal allergies have been worse.  Rides a motorcycle so cannot really avoid allergens.  Benadryl not helping. Left ear starting hurting a few days ago.  No fevers or chills. No loss of hearing. No cough. No SOB.  No CP.  Has been doing ok.    Not having panic attacks.   Only taking Alprazolam 1 mg at bedtime.  Denies any symptoms of depression.      DM- has been diet controlled since he gastric bypass in 2006 (lost 200 lb).    Weight stable.   Lab Results  Component Value Date   HGBA1C 5.8 10/10/2011     Wt Readings from Last 3 Encounters:  12/27/11 189 lb (85.73 kg)  10/10/11 190 lb (86.183 kg)  05/04/11 191 lb (86.637 kg)   Denies any symptoms of hyper or hypo glycemia. Lab Results  Component Value Date   HGBA1C 5.8 10/10/2011     HLD-  On Zocor 20 mg daily.  Never had issues with myalgias.  Well controlled.   Lab Results  Component Value Date   HDL 35.60* 10/10/2011   HDL 36.30* 10/06/2010   HDL 44.60 09/03/2009   Lab Results  Component Value Date   LDLCALC 76 10/10/2011   LDLCALC 58 10/06/2010   LDLCALC 83 09/03/2009   Lab Results  Component Value Date   TRIG 64.0 10/10/2011   TRIG 46.0 10/06/2010   TRIG 74.0 09/03/2009   CAD- denies any CP. CAD s/p CABG in 2005- followed by Dr. Excell Seltzer but has not seen him since 2011. Doing well. Reports that his heart is no longer what keeps him from exertion. Can walk for 45 minutes but has to stop due to lower and upper back pain. Denies any SOB, CP, orthopnea, LE edema, presycope or syncope   Patient Active Problem List  Diagnoses  . DIABETES MELLITUS, TYPE II  . VITAMIN D DEFICIENCY  . HYPERLIPIDEMIA  . ANEMIA-IRON DEFICIENCY  . TOBACCO ABUSE  . ADJUSTMENT DISORDER WITH ANXIETY  . HYPERTENSION  . MYOCARDIAL INFARCTION, HX OF  . CAD, ARTERY BYPASS GRAFT  . CARDIOMYOPATHY, ISCHEMIC  . LOW BACK PAIN  . PULMONARY EMBOLISM, HX OF  . Neck pain  . Routine general medical  examination at a health care facility   Past Medical History  Diagnosis Date  . Diabetes mellitus   . Hyperlipidemia   . Hypertension   . Low back pain   . MI (myocardial infarction)   . Arthritis   . Tobacco abuse   . Left ventricular dysfunction   . Obesity   . CAD (coronary artery disease)   . PE (pulmonary embolism)   . Anemia    Past Surgical History  Procedure Date  . Coronary stent placement     3 all in right side  . Gastric bypass   . Coronary artery bypass graft   . Cholecystectomy   . Hernia repair    History  Substance Use Topics  . Smoking status: Current Everyday Smoker -- 1 years  . Smokeless tobacco: Not on file  . Alcohol Use: No   Family History  Problem Relation Age of Onset  . Emphysema Mother   . Heart attack Father   . Heart disease Daughter   . Heart disease Brother   . Arthritis Brother   . Hemophilia Brother    Allergies  Allergen Reactions  . Codeine     REACTION: makes me "  looney"   Current Outpatient Prescriptions on File Prior to Visit  Medication Sig Dispense Refill  . ALPRAZolam (XANAX) 1 MG tablet TAKE ONE TABLET BY MOUTH THREE TIMES DAILY AS NEEDED FOR ANXIETY  90 tablet  3  . aspirin 81 MG tablet Take 81 mg by mouth daily.        . Calcium Citrate-Vitamin D 500-400 MG-UNIT CHEW Chew 1 tablet by mouth 2 (two) times daily.        . Cholecalciferol (VITAMIN D) 2000 UNITS tablet Take 2,000 Units by mouth daily.        Marland Kitchen docusate sodium (COLACE) 100 MG capsule Take 100 mg by mouth as needed.        Marland Kitchen FLEXERIL 10 MG tablet TAKE 1 TABLET EVERY 8    HOURS AS NEEDED FORMUSCLE SPASM  30 each  0  . HYDROcodone-acetaminophen (LORTAB 10) 10-500 MG per tablet TAKE (1) TABLET BY MOUTH FOUR TIMES A DAY AS NEEDED FOR PAIN.  120 tablet  1  . metaxalone (SKELAXIN) 800 MG tablet Take 1 tablet (800 mg total) by mouth 3 (three) times daily.  90 tablet  1  . metoprolol succinate (TOPROL-XL) 25 MG 24 hr tablet Take 1 tablet (25 mg total) by mouth 2  (two) times daily.  60 tablet  3  . Multiple Vitamin (MULTIVITAMIN) tablet Take 1 tablet by mouth daily.        . nitroGLYCERIN (NITROSTAT) 0.3 MG SL tablet Place 1 tablet (0.3 mg total) under the tongue every 5 (five) minutes as needed.  90 tablet  3  . simvastatin (ZOCOR) 40 MG tablet Take 1 tablet (40 mg total) by mouth at bedtime.  90 tablet  3  . atorvastatin (LIPITOR) 40 MG tablet Take 40 mg by mouth daily.         The PMH, PSH, Social History, Family History, Medications, and allergies have been reviewed in [facility], and have been updated if relevant.   Review of Systems       See HPI General:  Denies malaise. Eyes:  Denies blurring. CV:  Denies chest pain or discomfort. Resp:  Denies shortness of breath. Psych:  Denies panic attacks, sense of great danger, suicidal thoughts/plans, thoughts of violence, unusual visions or sounds, and thoughts /plans of harming others.  Physical Exam BP 100/60  Pulse 52  Temp(Src) 97.9 F (36.6 C) (Oral)  Wt 189 lb (85.73 kg) General:  overweght male in NAD Eyes:  PERRL Ears:  External ear exam shows no significant lesions or deformities.   Left TM erythematous and buldging Nose:  External nasal examination shows no deformity or inflammation. Nasal mucosa are pink and moist without lesions or exudates. Mouth:  Pos PND Teeth in good repair. Neck:  no carotid bruit or thyromegaly no cervical or supraclavicular lymphadenopathy  Lungs:  Normal respiratory effort, chest expands symmetrically. Lungs are clear to auscultation, no crackles or wheezes. Heart:  Normal rate and regular rhythm. S1 and S2 normal without gallop, murmur, click, rub or other extra sounds. Abdomen:  Bowel sounds positive,abdomen soft and non-tender without masses, organomegaly or hernias noted. Pulses:  R and L posterior tibial pulses are full and equal bilaterally  Extremities:  no edema    1. Otitis media left-  Amoxicillin 500 mg twice daily x 10 days. Allegra daily for  allergic rhinitis.  Pt would prefer not to use nasal sprays.   2. LOW BACK PAIN  Stable.  NO signs of narcotic abuse. Refilled Lortab today.  3. HYPERTENSION  Stable.   Continue current medications.  4. HYPERLIPIDEMIA  Stable.Continue simvastatin 20 mg daily Recheck lipids today.

## 2012-01-10 ENCOUNTER — Ambulatory Visit: Payer: Medicare Other | Admitting: Family Medicine

## 2012-02-28 ENCOUNTER — Other Ambulatory Visit: Payer: Self-pay | Admitting: Family Medicine

## 2012-02-28 NOTE — Telephone Encounter (Signed)
Medicine called to [facility].

## 2012-03-30 ENCOUNTER — Other Ambulatory Visit: Payer: Self-pay | Admitting: Family Medicine

## 2012-03-30 NOTE — Telephone Encounter (Signed)
Medicine called to [place]. 

## 2012-04-23 ENCOUNTER — Other Ambulatory Visit: Payer: Self-pay | Admitting: Family Medicine

## 2012-04-23 NOTE — Telephone Encounter (Signed)
Medicine called to pharmacy. 

## 2012-04-30 ENCOUNTER — Ambulatory Visit (INDEPENDENT_AMBULATORY_CARE_PROVIDER_SITE_OTHER): Payer: Medicare Other | Admitting: Family Medicine

## 2012-04-30 VITALS — BP 100/60 | HR 56 | Temp 97.9°F | Wt 191.0 lb

## 2012-04-30 DIAGNOSIS — Z23 Encounter for immunization: Secondary | ICD-10-CM

## 2012-04-30 DIAGNOSIS — M545 Low back pain, unspecified: Secondary | ICD-10-CM

## 2012-04-30 DIAGNOSIS — E119 Type 2 diabetes mellitus without complications: Secondary | ICD-10-CM

## 2012-04-30 DIAGNOSIS — I10 Essential (primary) hypertension: Secondary | ICD-10-CM

## 2012-04-30 DIAGNOSIS — E785 Hyperlipidemia, unspecified: Secondary | ICD-10-CM

## 2012-04-30 MED ORDER — ALPRAZOLAM 1 MG PO TABS: ORAL_TABLET | ORAL | Status: DC

## 2012-04-30 MED ORDER — HYDROCODONE-ACETAMINOPHEN 10-500 MG PO TABS: ORAL_TABLET | ORAL | Status: DC

## 2012-04-30 MED ORDER — PENICILLIN V POTASSIUM 500 MG PO TABS: 500.0000 mg | ORAL_TABLET | Freq: Three times a day (TID) | ORAL | Status: DC

## 2012-04-30 NOTE — Patient Instructions (Addendum)
Good to see you. Take penicillin as directed- 1 tablet 3 times daily. Please go to see your dentist. Call me immediately if you develop a fever.

## 2012-04-30 NOTE — Progress Notes (Signed)
56 yo here for follow up.  Has been doing ok.    Not having panic attacks.   Only taking Alprazolam 1 mg at bedtime.  Denies any symptoms of depression.   Daughter has actually been doing very well.  Dental abscess- swelling of gum and pain for a few weeks. Does not have money to see the dentist right now. No fevers or chills. No difficulty swallowing.   DM- has been diet controlled since he gastric bypass in 2006 (lost 200 lb).    Weight stable.   Lab Results  Component Value Date   HGBA1C 5.8 10/10/2011      Denies any symptoms of hyper or hypo glycemia. Lab Results  Component Value Date   HGBA1C 5.8 10/10/2011     HLD-  On Zocor 20 mg daily.  Never had issues with myalgias.  Well controlled.   Lab Results  Component Value Date   CHOL 124 10/10/2011   HDL 35.60* 10/10/2011   LDLCALC 76 10/10/2011   TRIG 64.0 10/10/2011   CHOLHDL 3 10/10/2011     Patient Active Problem List  Diagnosis  . DIABETES MELLITUS, TYPE II  . VITAMIN D DEFICIENCY  . HYPERLIPIDEMIA  . ANEMIA-IRON DEFICIENCY  . TOBACCO ABUSE  . ADJUSTMENT DISORDER WITH ANXIETY  . HYPERTENSION  . MYOCARDIAL INFARCTION, HX OF  . CAD, ARTERY BYPASS GRAFT  . CARDIOMYOPATHY, ISCHEMIC  . LOW BACK PAIN  . PULMONARY EMBOLISM, HX OF  . Neck pain  . Routine general medical examination at a health care facility  . Otitis media   Past Medical History  Diagnosis Date  . Diabetes mellitus   . Hyperlipidemia   . Hypertension   . Low back pain   . MI (myocardial infarction)   . Arthritis   . Tobacco abuse   . Left ventricular dysfunction   . Obesity   . CAD (coronary artery disease)   . PE (pulmonary embolism)   . Anemia    Past Surgical History  Procedure Date  . Coronary stent placement     3 all in right side  . Gastric bypass   . Coronary artery bypass graft   . Cholecystectomy   . Hernia repair    History  Substance Use Topics  . Smoking status: Current Every Day Smoker -- 1 years  . Smokeless  tobacco: Not on file  . Alcohol Use: No   Family History  Problem Relation Age of Onset  . Emphysema Mother   . Heart attack Father   . Heart disease Daughter   . Heart disease Brother   . Arthritis Brother   . Hemophilia Brother    Allergies  Allergen Reactions  . Codeine     REACTION: makes me "looney"   Current Outpatient Prescriptions on File Prior to Visit  Medication Sig Dispense Refill  . ALPRAZolam (XANAX) 1 MG tablet TAKE ONE TABLET BY MOUTH THREE TIMES DAILY AS NEEDED FOR ANXIETY  90 tablet  0  . aspirin 81 MG tablet Take 81 mg by mouth daily.        Marland Kitchen atorvastatin (LIPITOR) 40 MG tablet Take 0.5 tablets (20 mg total) by mouth daily.  30 tablet  3  . Calcium Citrate-Vitamin D 500-400 MG-UNIT CHEW Chew 1 tablet by mouth 2 (two) times daily.        . Cholecalciferol (VITAMIN D) 2000 UNITS tablet Take 2,000 Units by mouth daily.        Marland Kitchen docusate sodium (COLACE) 100 MG  capsule Take 100 mg by mouth as needed.        . fexofenadine (ALLEGRA) 180 MG tablet Take 1 tablet (180 mg total) by mouth daily.      Marland Kitchen FLEXERIL 10 MG tablet TAKE 1 TABLET EVERY 8    HOURS AS NEEDED FORMUSCLE SPASM  30 each  0  . LORTAB 10 10-500 MG per tablet TAKE (1) TABLET BY MOUTH EVERY 4 TO 6 HOURS AS NEEDED FOR PAIN.  150 each  0  . metoprolol succinate (TOPROL-XL) 25 MG 24 hr tablet Take 1 tablet (25 mg total) by mouth 2 (two) times daily.  60 tablet  3  . Multiple Vitamin (MULTIVITAMIN) tablet Take 1 tablet by mouth daily.        . nitroGLYCERIN (NITROSTAT) 0.3 MG SL tablet Place 1 tablet (0.3 mg total) under the tongue every 5 (five) minutes as needed.  90 tablet  3  . simvastatin (ZOCOR) 40 MG tablet Take 0.5 tablets (20 mg total) by mouth at bedtime.  90 tablet  3   The PMH, PSH, Social History, Family History, Medications, and allergies have been reviewed in [facility], and have been updated if relevant.   Review of Systems       See HPI General:  Denies malaise. Eyes:  Denies blurring. CV:  Denies  chest pain or discomfort. Resp:  Denies shortness of breath.   Physical Exam BP 100/60  Pulse 56  Temp 97.9 F (36.6 C)  Wt 191 lb (86.637 kg) Wt Readings from Last 3 Encounters:  04/30/12 191 lb (86.637 kg)  12/27/11 189 lb (85.73 kg)  10/10/11 190 lb (86.183 kg)    General:  overweght male in NAD Eyes:  PERRL Ears:  External ear exam shows no significant lesions or deformities.  Otoscopic examination reveals clear canals, tympanic membranes are intact bilaterally without bulging, retraction, inflammation or discharge. Hearing is grossly normal bilaterally. Nose:  External nasal examination shows no deformity or inflammation. Nasal mucosa are pink and moist without lesions or exudates. Mouth:   +erythema right 3rd lower molar, no pus, no foul odor. Neck:  no carotid bruit or thyromegaly no cervical or supraclavicular lymphadenopathy  Lungs:  Normal respiratory effort, chest expands symmetrically. Lungs are clear to auscultation, no crackles or wheezes. Heart:  Normal rate and regular rhythm. S1 and S2 normal without gallop, murmur, click, rub or other extra sounds. Abdomen:  Bowel sounds positive,abdomen soft and non-tender without masses, organomegaly or hernias noted. Pulses:  R and L posterior tibial pulses are full and equal bilaterally  Extremities:  no edema    1. LOW BACK PAIN  Stable.  NO signs of narcotic abuse. Refilled Lortab today.  2. HYPERTENSION  Stable.   Continue current medications.  3. Dental abscess Rx for PCN given. Advised he must see dentist ASAP. The patient indicates understanding of these issues and agrees with the plan.

## 2012-04-30 NOTE — Addendum Note (Signed)
Addended by: Eliezer Bottom on: 04/30/2012 11:31 AM   Modules accepted: Orders

## 2012-05-18 ENCOUNTER — Other Ambulatory Visit: Payer: Self-pay | Admitting: *Deleted

## 2012-05-18 MED ORDER — METOPROLOL SUCCINATE ER 25 MG PO TB24: 25.0000 mg | ORAL_TABLET | Freq: Two times a day (BID) | ORAL | Status: DC

## 2012-05-31 ENCOUNTER — Other Ambulatory Visit: Payer: Self-pay | Admitting: *Deleted

## 2012-05-31 NOTE — Telephone Encounter (Signed)
Faxed refill request from [facility], last filled 04/30/12.

## 2012-06-01 ENCOUNTER — Other Ambulatory Visit: Payer: Self-pay

## 2012-06-01 MED ORDER — HYDROCODONE-ACETAMINOPHEN 10-500 MG PO TABS: ORAL_TABLET | ORAL | Status: DC

## 2012-06-01 NOTE — Telephone Encounter (Signed)
Medicine called to [place]. 

## 2012-06-01 NOTE — Telephone Encounter (Signed)
Ok to print out and leave in my box.

## 2012-06-01 NOTE — Telephone Encounter (Signed)
Pt checking on status of hydrocodone refill; pt said requested from pharmacy on 05/29/12.Please advise.

## 2012-06-11 NOTE — Telephone Encounter (Signed)
See 05-31-12 note.

## 2012-06-28 ENCOUNTER — Other Ambulatory Visit: Payer: Self-pay | Admitting: *Deleted

## 2012-06-28 MED ORDER — HYDROCODONE-ACETAMINOPHEN 10-500 MG PO TABS: ORAL_TABLET | ORAL | Status: DC

## 2012-06-28 NOTE — Telephone Encounter (Signed)
Medicine called to [place]. 

## 2012-07-17 ENCOUNTER — Other Ambulatory Visit: Payer: Self-pay | Admitting: *Deleted

## 2012-07-17 MED ORDER — ALPRAZOLAM 1 MG PO TABS: ORAL_TABLET | ORAL | Status: DC

## 2012-07-18 NOTE — Telephone Encounter (Signed)
rx called in

## 2012-07-26 ENCOUNTER — Other Ambulatory Visit: Payer: Self-pay | Admitting: *Deleted

## 2012-07-26 MED ORDER — HYDROCODONE-ACETAMINOPHEN 10-500 MG PO TABS: ORAL_TABLET | ORAL | Status: DC

## 2012-07-26 NOTE — Telephone Encounter (Signed)
Medicine called to [place]. 

## 2012-08-27 ENCOUNTER — Ambulatory Visit (INDEPENDENT_AMBULATORY_CARE_PROVIDER_SITE_OTHER): Payer: Medicare Other | Admitting: Family Medicine

## 2012-08-27 ENCOUNTER — Encounter: Payer: Self-pay | Admitting: Family Medicine

## 2012-08-27 VITALS — BP 116/68 | HR 64 | Temp 98.5°F | Wt 200.5 lb

## 2012-08-27 DIAGNOSIS — I10 Essential (primary) hypertension: Secondary | ICD-10-CM

## 2012-08-27 DIAGNOSIS — I2589 Other forms of chronic ischemic heart disease: Secondary | ICD-10-CM

## 2012-08-27 DIAGNOSIS — E119 Type 2 diabetes mellitus without complications: Secondary | ICD-10-CM

## 2012-08-27 DIAGNOSIS — E785 Hyperlipidemia, unspecified: Secondary | ICD-10-CM

## 2012-08-27 MED ORDER — SIMVASTATIN 40 MG PO TABS: 40.0000 mg | ORAL_TABLET | Freq: Every day | ORAL | Status: DC

## 2012-08-27 MED ORDER — ALPRAZOLAM 1 MG PO TABS: ORAL_TABLET | ORAL | Status: DC

## 2012-08-27 MED ORDER — HYDROCODONE-ACETAMINOPHEN 10-325 MG PO TABS: 1.0000 | ORAL_TABLET | Freq: Three times a day (TID) | ORAL | Status: DC | PRN

## 2012-08-27 NOTE — Progress Notes (Signed)
57 yo here for follow up.  He has been doing quite well. Staying active in his shop.  Still loves riding his motorcycle whenever he can.    Only taking Alprazolam 1 mg at bedtime.  Denies any symptoms of depression.     DM- has been diet controlled since he gastric bypass in 2006 (lost 200 lb).    Weight stable.   Lab Results  Component Value Date   HGBA1C 5.8 10/10/2011     Denies any symptoms of hyper or hypo glycemia.    HLD-  On Zocor 20 mg daily.  Never had issues with myalgias.  Well controlled.   Lab Results  Component Value Date   CHOL 124 10/10/2011   HDL 35.60* 10/10/2011   LDLCALC 76 10/10/2011   TRIG 64.0 10/10/2011   CHOLHDL 3 10/10/2011    Patient Active Problem List  Diagnosis  . DIABETES MELLITUS, TYPE II  . VITAMIN D DEFICIENCY  . HYPERLIPIDEMIA  . ANEMIA-IRON DEFICIENCY  . TOBACCO ABUSE  . ADJUSTMENT DISORDER WITH ANXIETY  . HYPERTENSION  . MYOCARDIAL INFARCTION, HX OF  . CAD, ARTERY BYPASS GRAFT  . CARDIOMYOPATHY, ISCHEMIC  . PULMONARY EMBOLISM, HX OF  . Routine general medical examination at a health care facility   Past Medical History  Diagnosis Date  . Diabetes mellitus   . Hyperlipidemia   . Hypertension   . Low back pain   . MI (myocardial infarction)   . Arthritis   . Tobacco abuse   . Left ventricular dysfunction   . Obesity   . CAD (coronary artery disease)   . PE (pulmonary embolism)   . Anemia    Past Surgical History  Procedure Date  . Coronary stent placement     3 all in right side  . Gastric bypass   . Coronary artery bypass graft   . Cholecystectomy   . Hernia repair    History  Substance Use Topics  . Smoking status: Current Every Day Smoker -- 1 years  . Smokeless tobacco: Not on file  . Alcohol Use: No   Family History  Problem Relation Age of Onset  . Emphysema Mother   . Heart attack Father   . Heart disease Daughter   . Heart disease Brother   . Arthritis Brother   . Hemophilia Brother    Allergies    Allergen Reactions  . Codeine     REACTION: makes me "looney"   Current Outpatient Prescriptions on File Prior to Visit  Medication Sig Dispense Refill  . ALPRAZolam (XANAX) 1 MG tablet TAKE ONE TABLET BY MOUTH THREE TIMES DAILY AS NEEDED FOR ANXIETY  90 tablet  0  . aspirin 81 MG tablet Take 81 mg by mouth daily.        Marland Kitchen atorvastatin (LIPITOR) 40 MG tablet Take 0.5 tablets (20 mg total) by mouth daily.  30 tablet  3  . Calcium Citrate-Vitamin D 500-400 MG-UNIT CHEW Chew 1 tablet by mouth 2 (two) times daily.        . Cholecalciferol (VITAMIN D) 2000 UNITS tablet Take 2,000 Units by mouth daily.        Marland Kitchen docusate sodium (COLACE) 100 MG capsule Take 100 mg by mouth as needed.        . fexofenadine (ALLEGRA) 180 MG tablet Take 1 tablet (180 mg total) by mouth daily.      Marland Kitchen FLEXERIL 10 MG tablet TAKE 1 TABLET EVERY 8    HOURS AS NEEDED FORMUSCLE  SPASM  30 each  0  . HYDROcodone-acetaminophen (LORTAB 10) 10-500 MG per tablet TAKE (1) TABLET BY MOUTH EVERY 4 TO 6 HOURS AS NEEDED FOR PAIN.  150 tablet  0  . metoprolol succinate (TOPROL-XL) 25 MG 24 hr tablet Take 1 tablet (25 mg total) by mouth 2 (two) times daily.  60 tablet  6  . Multiple Vitamin (MULTIVITAMIN) tablet Take 1 tablet by mouth daily.        . nitroGLYCERIN (NITROSTAT) 0.3 MG SL tablet Place 1 tablet (0.3 mg total) under the tongue every 5 (five) minutes as needed.  90 tablet  3  . penicillin v potassium (VEETID) 500 MG tablet Take 1 tablet (500 mg total) by mouth 3 (three) times daily.  30 tablet  0  . simvastatin (ZOCOR) 40 MG tablet Take 0.5 tablets (20 mg total) by mouth at bedtime.  90 tablet  3   The PMH, PSH, Social History, Family History, Medications, and allergies have been reviewed in [facility], and have been updated if relevant.   Review of Systems       See HPI   Physical Exam BP 116/68  Pulse 64  Temp 98.5 F (36.9 C) (Oral)  Wt 200 lb 8 oz (90.946 kg) Wt Readings from Last 3 Encounters:  08/27/12 200 lb 8 oz  (90.946 kg)  04/30/12 191 lb (86.637 kg)  12/27/11 189 lb (85.73 kg)    General:  overweght male in NAD Eyes:  PERRL Ears:  External ear exam shows no significant lesions or deformities.  Otoscopic examination reveals clear canals, tympanic membranes are intact bilaterally without bulging, retraction, inflammation or discharge. Hearing is grossly normal bilaterally. Nose:  External nasal examination shows no deformity or inflammation. Nasal mucosa are pink and moist without lesions or exudates. Neck:  no carotid bruit or thyromegaly no cervical or supraclavicular lymphadenopathy  Lungs:  Normal respiratory effort, chest expands symmetrically. Lungs are clear to auscultation, no crackles or wheezes. Heart:  Normal rate and regular rhythm. S1 and S2 normal without gallop, murmur, click, rub or other extra sounds. Abdomen:  Bowel sounds positive,abdomen soft and non-tender without masses, organomegaly or hernias noted. Pulses:  R and L posterior tibial pulses are full and equal bilaterally  Extremities:  no edema    1. LOW BACK PAIN  Stable.  NO signs of narcotic abuse. Refilled Lortab today.  2. HYPERTENSION  Stable.   Continue current medications.   3. DIABETES MELLITUS, TYPE II  Diet controlled.  4. HYPERLIPIDEMIA  Stable on current dose of zocor.  5. CARDIOMYOPATHY, ISCHEMIC   Quiet.  On Toprol, ASA.

## 2012-10-01 ENCOUNTER — Other Ambulatory Visit: Payer: Self-pay | Admitting: *Deleted

## 2012-10-01 MED ORDER — ALPRAZOLAM 1 MG PO TABS: ORAL_TABLET | ORAL | Status: DC

## 2012-10-01 MED ORDER — HYDROCODONE-ACETAMINOPHEN 10-325 MG PO TABS: 1.0000 | ORAL_TABLET | Freq: Three times a day (TID) | ORAL | Status: DC | PRN

## 2012-10-01 NOTE — Telephone Encounter (Signed)
Medicine called to [facility].

## 2012-10-29 ENCOUNTER — Encounter: Payer: Medicare Other | Admitting: Family Medicine

## 2012-10-31 ENCOUNTER — Ambulatory Visit (INDEPENDENT_AMBULATORY_CARE_PROVIDER_SITE_OTHER): Payer: Medicare Other | Admitting: Family Medicine

## 2012-10-31 ENCOUNTER — Encounter: Payer: Self-pay | Admitting: Family Medicine

## 2012-10-31 VITALS — BP 102/70 | HR 45 | Temp 97.9°F | Ht 72.0 in | Wt 199.5 lb

## 2012-10-31 DIAGNOSIS — I2589 Other forms of chronic ischemic heart disease: Secondary | ICD-10-CM

## 2012-10-31 DIAGNOSIS — I25810 Atherosclerosis of coronary artery bypass graft(s) without angina pectoris: Secondary | ICD-10-CM

## 2012-10-31 DIAGNOSIS — Z125 Encounter for screening for malignant neoplasm of prostate: Secondary | ICD-10-CM

## 2012-10-31 DIAGNOSIS — I10 Essential (primary) hypertension: Secondary | ICD-10-CM

## 2012-10-31 DIAGNOSIS — Z23 Encounter for immunization: Secondary | ICD-10-CM

## 2012-10-31 DIAGNOSIS — Z1211 Encounter for screening for malignant neoplasm of colon: Secondary | ICD-10-CM

## 2012-10-31 DIAGNOSIS — E119 Type 2 diabetes mellitus without complications: Secondary | ICD-10-CM

## 2012-10-31 DIAGNOSIS — Z0000 Encounter for general adult medical examination without abnormal findings: Secondary | ICD-10-CM

## 2012-10-31 DIAGNOSIS — F17200 Nicotine dependence, unspecified, uncomplicated: Secondary | ICD-10-CM

## 2012-10-31 DIAGNOSIS — E785 Hyperlipidemia, unspecified: Secondary | ICD-10-CM

## 2012-10-31 DIAGNOSIS — E559 Vitamin D deficiency, unspecified: Secondary | ICD-10-CM

## 2012-10-31 LAB — COMPREHENSIVE METABOLIC PANEL
AST: 30 U/L (ref 0–37)
Albumin: 4 g/dL (ref 3.5–5.2)
Alkaline Phosphatase: 54 U/L (ref 39–117)
Glucose, Bld: 99 mg/dL (ref 70–99)
Potassium: 4.7 mEq/L (ref 3.5–5.1)
Sodium: 137 mEq/L (ref 135–145)
Total Protein: 7.1 g/dL (ref 6.0–8.3)

## 2012-10-31 LAB — LIPID PANEL
Cholesterol: 130 mg/dL (ref 0–200)
LDL Cholesterol: 82 mg/dL (ref 0–99)
Total CHOL/HDL Ratio: 3

## 2012-10-31 MED ORDER — HYDROCODONE-ACETAMINOPHEN 10-325 MG PO TABS: 1.0000 | ORAL_TABLET | Freq: Three times a day (TID) | ORAL | Status: DC | PRN

## 2012-10-31 MED ORDER — ALPRAZOLAM 1 MG PO TABS: ORAL_TABLET | ORAL | Status: DC

## 2012-10-31 MED ORDER — SIMVASTATIN 40 MG PO TABS: 40.0000 mg | ORAL_TABLET | Freq: Every day | ORAL | Status: DC

## 2012-10-31 NOTE — Addendum Note (Signed)
Addended by: Eliezer Bottom on: 10/31/2012 08:34 AM   Modules accepted: Orders

## 2012-10-31 NOTE — Progress Notes (Addendum)
Very pleasant 57 yo here for annual medicare wellness visit.  I have personally reviewed the Medicare Annual Wellness questionnaire and have noted 1. The patient's medical and social history 2. Their use of alcohol, tobacco or illicit drugs 3. Their current medications and supplements 4. The patient's functional ability including ADL's, fall risks, home safety risks and hearing or visual             impairment. 5. Diet and physical activities 6. Evidence for depression or mood disorders  End of life wishes discussed and updated in Social History.  Has never had a colonoscopy, not willing to have one.  He is willing to do stool cards.   He has been doing quite well. Staying active in his shop.  Still loves riding his motorcycle whenever he can.    Only taking Alprazolam 1 mg at bedtime.  Denies any symptoms of depression.     DM- has been diet controlled since he gastric bypass in 2006 (lost 200 lb).    Weight stable.   Lab Results  Component Value Date   HGBA1C 5.8 10/10/2011     Denies any symptoms of hyper or hypo glycemia.    HLD-  On Zocor 20 mg daily.  Never had issues with myalgias.  Well controlled.   Lab Results  Component Value Date   CHOL 124 10/10/2011   HDL 35.60* 10/10/2011   LDLCALC 76 10/10/2011   TRIG 64.0 10/10/2011   CHOLHDL 3 10/10/2011    Patient Active Problem List  Diagnosis  . DIABETES MELLITUS, TYPE II  . VITAMIN D DEFICIENCY  . HYPERLIPIDEMIA  . ANEMIA-IRON DEFICIENCY  . TOBACCO ABUSE  . HYPERTENSION  . MYOCARDIAL INFARCTION, HX OF  . CAD, ARTERY BYPASS GRAFT  . CARDIOMYOPATHY, ISCHEMIC  . PULMONARY EMBOLISM, HX OF  . Routine general medical examination at a health care facility   Past Medical History  Diagnosis Date  . Diabetes mellitus   . Hyperlipidemia   . Hypertension   . Low back pain   . MI (myocardial infarction)   . Arthritis   . Tobacco abuse   . Left ventricular dysfunction   . Obesity   . CAD (coronary artery disease)    . PE (pulmonary embolism)   . Anemia    Past Surgical History  Procedure Laterality Date  . Coronary stent placement      3 all in right side  . Gastric bypass    . Coronary artery bypass graft    . Cholecystectomy    . Hernia repair     History  Substance Use Topics  . Smoking status: Current Every Day Smoker -- 1.00 packs/day for 40 years    Types: Cigarettes  . Smokeless tobacco: Former Neurosurgeon  . Alcohol Use: No   Family History  Problem Relation Age of Onset  . Emphysema Mother   . Heart attack Father   . Heart disease Daughter   . Heart disease Brother   . Arthritis Brother   . Hemophilia Brother    Allergies  Allergen Reactions  . Codeine     REACTION: makes me "looney"   Current Outpatient Prescriptions on File Prior to Visit  Medication Sig Dispense Refill  . ALPRAZolam (XANAX) 1 MG tablet TAKE ONE TABLET BY MOUTH THREE TIMES DAILY AS NEEDED FOR ANXIETY  90 tablet  0  . aspirin 81 MG tablet Take 81 mg by mouth daily.        . Calcium Citrate-Vitamin D 500-400  MG-UNIT CHEW Chew 1 tablet by mouth 2 (two) times daily.        . Cholecalciferol (VITAMIN D) 2000 UNITS tablet Take 2,000 Units by mouth daily.        Marland Kitchen docusate sodium (COLACE) 100 MG capsule Take 100 mg by mouth as needed.        . fexofenadine (ALLEGRA) 180 MG tablet Take 180 mg by mouth daily as needed.      Marland Kitchen HYDROcodone-acetaminophen (NORCO) 10-325 MG per tablet Take 1 tablet by mouth every 8 (eight) hours as needed for pain.  150 tablet  0  . metoprolol succinate (TOPROL-XL) 25 MG 24 hr tablet Take 1/2 by mouth two times a day      . Multiple Vitamin (MULTIVITAMIN) tablet Take 1 tablet by mouth daily.        . nitroGLYCERIN (NITROSTAT) 0.3 MG SL tablet Place 1 tablet (0.3 mg total) under the tongue every 5 (five) minutes as needed.  90 tablet  3  . simvastatin (ZOCOR) 40 MG tablet Take 1 tablet (40 mg total) by mouth at bedtime.  90 tablet  3   No current facility-administered medications on file  prior to visit.   The PMH, PSH, Social History, Family History, Medications, and allergies have been reviewed in [facility], and have been updated if relevant.   Review of Systems       See HPI   Physical Exam There were no vitals taken for this visit. Wt Readings from Last 3 Encounters:  08/27/12 200 lb 8 oz (90.946 kg)  04/30/12 191 lb (86.637 kg)  12/27/11 189 lb (85.73 kg)    General:  overweght male in NAD Eyes:  PERRL Ears:  External ear exam shows no significant lesions or deformities.  Otoscopic examination reveals clear canals, tympanic membranes are intact bilaterally without bulging, retraction, inflammation or discharge. Hearing is grossly normal bilaterally. Nose:  External nasal examination shows no deformity or inflammation. Nasal mucosa are pink and moist without lesions or exudates. Neck:  no carotid bruit or thyromegaly no cervical or supraclavicular lymphadenopathy  Lungs:  Normal respiratory effort, chest expands symmetrically. Lungs are clear to auscultation, no crackles or wheezes. Heart:  Normal rate and regular rhythm. S1 and S2 normal without gallop, murmur, click, rub or other extra sounds. Abdomen:  Bowel sounds positive,abdomen soft and non-tender without masses, organomegaly or hernias noted. Pulses:  R and L posterior tibial pulses are full and equal bilaterally  Extremities:  no edema   Assessment and Plan:  1. Routine general medical examination at a health care facility The patients weight, height, BMI and visual acuity have been recorded in the chart I have made referrals, counseling and provided education to the patient based review of the above and I have provided the pt with a written personalized care plan for preventive services.  IFOB Pneumovax.   2. TOBACCO ABUSE   3. VITAMIN D DEFICIENCY Recheck Vit D today.    4. HYPERTENSION  Stable.   Continue current medications.  5. HYPERLIPIDEMIA On Zocor.  - Lipid Panel  6. DIABETES  MELLITUS, TYPE II Diet controlled.  Recheck a1c today.   - Comprehensive metabolic panel   7. CARDIOMYOPATHY, ISCHEMIC Followed by Cards but has not been seen in a couple of years.  EKG today appears unchanged from prior and he will make appt with Dr. Excell Seltzer.  8. CAD, ARTERY BYPASS GRAFT  Quiet.  On Toprol, ASA. 9. Screening for prostate cancer  - PSA

## 2012-10-31 NOTE — Addendum Note (Signed)
Addended by: Eliezer Bottom on: 10/31/2012 08:58 AM   Modules accepted: Orders

## 2012-10-31 NOTE — Patient Instructions (Signed)
Good to see you. Think about a colonoscopy and let me know.  We will call you with your lab results.

## 2012-10-31 NOTE — Addendum Note (Signed)
Addended by: Dianne Dun on: 10/31/2012 08:18 AM   Modules accepted: Orders

## 2012-11-09 ENCOUNTER — Other Ambulatory Visit (INDEPENDENT_AMBULATORY_CARE_PROVIDER_SITE_OTHER): Payer: Medicare Other

## 2012-11-09 DIAGNOSIS — Z1211 Encounter for screening for malignant neoplasm of colon: Secondary | ICD-10-CM

## 2012-11-09 LAB — FECAL OCCULT BLOOD, IMMUNOCHEMICAL: Fecal Occult Bld: NEGATIVE

## 2012-11-12 ENCOUNTER — Encounter: Payer: Self-pay | Admitting: Family Medicine

## 2012-11-26 ENCOUNTER — Other Ambulatory Visit: Payer: Self-pay | Admitting: Family Medicine

## 2012-11-26 MED ORDER — HYDROCODONE-ACETAMINOPHEN 10-325 MG PO TABS: 1.0000 | ORAL_TABLET | Freq: Three times a day (TID) | ORAL | Status: DC | PRN

## 2012-11-26 MED ORDER — ALPRAZOLAM 1 MG PO TABS: ORAL_TABLET | ORAL | Status: DC

## 2012-11-26 NOTE — Telephone Encounter (Signed)
Medicines called to pharmacy. 

## 2012-11-26 NOTE — Telephone Encounter (Addendum)
Both meds last filled 10/31/12.

## 2012-12-26 ENCOUNTER — Other Ambulatory Visit: Payer: Self-pay | Admitting: *Deleted

## 2012-12-26 MED ORDER — ALPRAZOLAM 1 MG PO TABS: ORAL_TABLET | ORAL | Status: DC

## 2012-12-26 MED ORDER — HYDROCODONE-ACETAMINOPHEN 10-325 MG PO TABS: 1.0000 | ORAL_TABLET | Freq: Three times a day (TID) | ORAL | Status: DC | PRN

## 2012-12-26 NOTE — Telephone Encounter (Signed)
Medicines called to [facility].

## 2012-12-26 NOTE — Telephone Encounter (Signed)
Faxed refill request from [place] [facility], last filled 11/26/12.

## 2012-12-27 ENCOUNTER — Other Ambulatory Visit: Payer: Self-pay | Admitting: *Deleted

## 2012-12-27 MED ORDER — SIMVASTATIN 40 MG PO TABS: 40.0000 mg | ORAL_TABLET | Freq: Every day | ORAL | Status: DC

## 2013-01-25 ENCOUNTER — Other Ambulatory Visit: Payer: Self-pay | Admitting: *Deleted

## 2013-01-25 MED ORDER — ALPRAZOLAM 1 MG PO TABS: ORAL_TABLET | ORAL | Status: DC

## 2013-01-25 MED ORDER — HYDROCODONE-ACETAMINOPHEN 10-325 MG PO TABS: 1.0000 | ORAL_TABLET | Freq: Three times a day (TID) | ORAL | Status: DC | PRN

## 2013-01-25 NOTE — Telephone Encounter (Signed)
Faxed refill request for vicodin from [facility], last filled 12/27/12.

## 2013-01-25 NOTE — Telephone Encounter (Signed)
Faxed refill request for alprazolam from [facility], last filled 12/27/12.

## 2013-01-25 NOTE — Telephone Encounter (Signed)
Medicines called to [facility]. 

## 2013-01-30 ENCOUNTER — Encounter: Payer: Self-pay | Admitting: *Deleted

## 2013-01-31 ENCOUNTER — Ambulatory Visit (INDEPENDENT_AMBULATORY_CARE_PROVIDER_SITE_OTHER): Payer: Medicare Other | Admitting: Family Medicine

## 2013-01-31 ENCOUNTER — Encounter: Payer: Self-pay | Admitting: Family Medicine

## 2013-01-31 VITALS — BP 110/64 | HR 52 | Temp 97.9°F | Wt 198.0 lb

## 2013-01-31 DIAGNOSIS — E785 Hyperlipidemia, unspecified: Secondary | ICD-10-CM

## 2013-01-31 DIAGNOSIS — E119 Type 2 diabetes mellitus without complications: Secondary | ICD-10-CM

## 2013-01-31 DIAGNOSIS — I10 Essential (primary) hypertension: Secondary | ICD-10-CM

## 2013-01-31 MED ORDER — ALPRAZOLAM 1 MG PO TABS: ORAL_TABLET | ORAL | Status: DC

## 2013-01-31 MED ORDER — HYDROCODONE-ACETAMINOPHEN 10-325 MG PO TABS: 1.0000 | ORAL_TABLET | Freq: Three times a day (TID) | ORAL | Status: DC | PRN

## 2013-01-31 NOTE — Progress Notes (Signed)
57 yo pleasant male here for follow up.  He has been doing quite well.   Still loves riding his motorcycle whenever he can.    Only taking Alprazolam 1 mg at bedtime.  Denies any symptoms of depression.     DM- has been diet controlled since he gastric bypass in 2006 (lost 200 lb).    Weight stable.   Lab Results  Component Value Date   HGBA1C 5.9 10/31/2012     Denies any symptoms of hyper or hypo glycemia.    HLD-  On Zocor 20 mg daily.  Never had issues with myalgias.  Well controlled.   Lab Results  Component Value Date   CHOL 130 10/31/2012   HDL 37.20* 10/31/2012   LDLCALC 82 10/31/2012   TRIG 55.0 10/31/2012   CHOLHDL 3 10/31/2012   Chronic back pain- no indication of abuse, has followed pain contract.  Feels pain is well controlled on current medication. Patient Active Problem List   Diagnosis Date Noted  . VITAMIN D DEFICIENCY 09/03/2009  . CAD, ARTERY BYPASS GRAFT 05/20/2009  . DIABETES MELLITUS, TYPE II 12/11/2006  . HYPERLIPIDEMIA 12/11/2006  . ANEMIA-IRON DEFICIENCY 12/11/2006  . TOBACCO ABUSE 12/11/2006  . HYPERTENSION 12/11/2006  . MYOCARDIAL INFARCTION, HX OF 12/11/2006  . CARDIOMYOPATHY, ISCHEMIC 12/11/2006  . PULMONARY EMBOLISM, HX OF 12/11/2006   Past Medical History  Diagnosis Date  . Diabetes mellitus   . Hyperlipidemia   . Hypertension   . Low back pain   . MI (myocardial infarction)   . Arthritis   . Tobacco abuse   . Left ventricular dysfunction   . Obesity   . CAD (coronary artery disease)   . PE (pulmonary embolism)   . Anemia    Past Surgical History  Procedure Laterality Date  . Coronary stent placement      3 all in right side  . Gastric bypass    . Coronary artery bypass graft    . Cholecystectomy    . Hernia repair     History  Substance Use Topics  . Smoking status: Current Every Day Smoker -- 1.00 packs/day for 40 years    Types: Cigarettes  . Smokeless tobacco: Former Neurosurgeon  . Alcohol Use: No   Family History  Problem  Relation Age of Onset  . Emphysema Mother   . Heart attack Father   . Heart disease Daughter   . Heart disease Brother   . Arthritis Brother   . Hemophilia Brother    Allergies  Allergen Reactions  . Codeine     REACTION: makes me "looney"   Current Outpatient Prescriptions on File Prior to Visit  Medication Sig Dispense Refill  . ALPRAZolam (XANAX) 1 MG tablet TAKE ONE TABLET BY MOUTH THREE TIMES DAILY AS NEEDED FOR ANXIETY  90 tablet  0  . aspirin 81 MG tablet Take 81 mg by mouth daily.        . Calcium Citrate-Vitamin D 500-400 MG-UNIT CHEW Chew 1 tablet by mouth 2 (two) times daily.        . Cholecalciferol (VITAMIN D) 2000 UNITS tablet Take 5,000 Units by mouth daily.       Marland Kitchen docusate sodium (COLACE) 100 MG capsule Take 100 mg by mouth as needed.        . fexofenadine (ALLEGRA) 180 MG tablet Take 180 mg by mouth daily as needed.      Marland Kitchen HYDROcodone-acetaminophen (NORCO) 10-325 MG per tablet Take 1 tablet by mouth every 8 (eight)  hours as needed for pain.  150 tablet  0  . metoprolol succinate (TOPROL-XL) 25 MG 24 hr tablet Take 1/2 by mouth two times a day      . Multiple Vitamin (MULTIVITAMIN) tablet Take 1 tablet by mouth daily.        . nitroGLYCERIN (NITROSTAT) 0.3 MG SL tablet Place 1 tablet (0.3 mg total) under the tongue every 5 (five) minutes as needed.  90 tablet  3  . simvastatin (ZOCOR) 40 MG tablet Take 1 tablet (40 mg total) by mouth at bedtime.  90 tablet  2   No current facility-administered medications on file prior to visit.   The PMH, PSH, Social History, Family History, Medications, and allergies have been reviewed in [facility], and have been updated if relevant.   Review of Systems       See HPI   Physical Exam BP 110/64  Pulse 52  Temp(Src) 97.9 F (36.6 C)  Wt 198 lb (89.812 kg)  BMI 26.85 kg/m2  Wt Readings from Last 3 Encounters:  01/31/13 198 lb (89.812 kg)  10/31/12 199 lb 8 oz (90.493 kg)  08/27/12 200 lb 8 oz (90.946 kg)   General:  overweght  male in NAD Eyes:  PERRL Ears:  External ear exam shows no significant lesions or deformities.  Otoscopic examination reveals clear canals, tympanic membranes are intact bilaterally without bulging, retraction, inflammation or discharge. Hearing is grossly normal bilaterally. Nose:  External nasal examination shows no deformity or inflammation. Nasal mucosa are pink and moist without lesions or exudates. Neck:  no carotid bruit or thyromegaly no cervical or supraclavicular lymphadenopathy  Lungs:  Normal respiratory effort, chest expands symmetrically. Lungs are clear to auscultation, no crackles or wheezes. Heart:  Normal rate and regular rhythm. S1 and S2 normal without gallop, murmur, click, rub or other extra sounds. Abdomen:  Bowel sounds positive,abdomen soft and non-tender without masses, organomegaly or hernias noted. Pulses:  R and L posterior tibial pulses are full and equal bilaterally  Extremities:  no edema    Assessment and Plan: 1. DIABETES MELLITUS, TYPE II Diet controlled since his gastric bypass.   2. HYPERLIPIDEMIA Stable.  No changes.  3. HYPERTENSION Well controlled. No changes.

## 2013-02-08 ENCOUNTER — Encounter: Payer: Self-pay | Admitting: Family Medicine

## 2013-02-26 ENCOUNTER — Encounter: Payer: Self-pay | Admitting: Family Medicine

## 2013-02-26 ENCOUNTER — Ambulatory Visit (INDEPENDENT_AMBULATORY_CARE_PROVIDER_SITE_OTHER): Payer: Medicare Other | Admitting: Family Medicine

## 2013-02-26 VITALS — BP 104/58 | HR 72 | Temp 98.2°F | Wt 199.0 lb

## 2013-02-26 DIAGNOSIS — L639 Alopecia areata, unspecified: Secondary | ICD-10-CM | POA: Insufficient documentation

## 2013-02-26 NOTE — Progress Notes (Signed)
Subjective:    Patient ID: [name], male    DOB: April 23, 1956, 57 y.o.   MRN: [id number]  HPI 57 yo here for bald spot on his beard.  Noticed it when he started to trim it earlier this week.  Has never had anything like it before.  No itching, redness, pain, itching or swelling.  No similar patches on other parts of his beard or scalp.  No one in house hold has similar lesions.  Patient Active Problem List   Diagnosis Date Noted  . Alopecia areata 02/26/2013  . VITAMIN D DEFICIENCY 09/03/2009  . CAD, ARTERY BYPASS GRAFT 05/20/2009  . DIABETES MELLITUS, TYPE II 12/11/2006  . HYPERLIPIDEMIA 12/11/2006  . ANEMIA-IRON DEFICIENCY 12/11/2006  . TOBACCO ABUSE 12/11/2006  . HYPERTENSION 12/11/2006  . MYOCARDIAL INFARCTION, HX OF 12/11/2006  . CARDIOMYOPATHY, ISCHEMIC 12/11/2006  . PULMONARY EMBOLISM, HX OF 12/11/2006   Past Medical History  Diagnosis Date  . Diabetes mellitus   . Hyperlipidemia   . Hypertension   . Low back pain   . MI (myocardial infarction)   . Arthritis   . Tobacco abuse   . Left ventricular dysfunction   . Obesity   . CAD (coronary artery disease)   . PE (pulmonary embolism)   . Anemia    Past Surgical History  Procedure Laterality Date  . Coronary stent placement      3 all in right side  . Gastric bypass    . Coronary artery bypass graft    . Cholecystectomy    . Hernia repair     History  Substance Use Topics  . Smoking status: Current Every Day Smoker -- 1.00 packs/day for 40 years    Types: Cigarettes  . Smokeless tobacco: Former Neurosurgeon  . Alcohol Use: No   Family History  Problem Relation Age of Onset  . Emphysema Mother   . Heart attack Father   . Heart disease Daughter   . Heart disease Brother   . Arthritis Brother   . Hemophilia Brother    Allergies  Allergen Reactions  . Codeine     REACTION: makes me "looney"   Current Outpatient Prescriptions on File Prior to Visit  Medication Sig Dispense Refill  . ALPRAZolam  (XANAX) 1 MG tablet TAKE ONE TABLET BY MOUTH THREE TIMES DAILY AS NEEDED FOR ANXIETY  90 tablet  0  . aspirin 81 MG tablet Take 81 mg by mouth daily.        . Calcium Citrate-Vitamin D 500-400 MG-UNIT CHEW Chew 1 tablet by mouth 2 (two) times daily.        . Cholecalciferol (VITAMIN D) 2000 UNITS tablet Take 5,000 Units by mouth daily.       Marland Kitchen docusate sodium (COLACE) 100 MG capsule Take 100 mg by mouth as needed.        . fexofenadine (ALLEGRA) 180 MG tablet Take 180 mg by mouth daily as needed.      Marland Kitchen HYDROcodone-acetaminophen (NORCO) 10-325 MG per tablet Take 1 tablet by mouth every 8 (eight) hours as needed for pain.  150 tablet  0  . metoprolol succinate (TOPROL-XL) 25 MG 24 hr tablet Take 1/2 by mouth two times a day      . Multiple Vitamin (MULTIVITAMIN) tablet Take 1 tablet by mouth daily.        . nitroGLYCERIN (NITROSTAT) 0.3 MG SL tablet Place 1 tablet (0.3 mg total) under the tongue every 5 (five) minutes as needed.  90 tablet  3   No current facility-administered medications on file prior to visit.   The PMH, PSH, Social History, Family History, Medications, and allergies have been reviewed in [facility], and have been updated if relevant.    Review of Systems    See HPI Objective:   Physical Exam  Constitutional: He appears well-developed and well-nourished. No distress.  HENT:  Head: Normocephalic.  Skin: Skin is dry. No rash noted.     Psychiatric: He has a normal mood and affect. His speech is normal and behavior is normal. Judgment and thought content normal. Cognition and memory are normal.    BP 104/58  Pulse 72  Temp(Src) 98.2 F (36.8 C) (Oral)  Wt 199 lb (90.266 kg)  BMI 26.98 kg/m2  SpO2 98%       Assessment & Plan:  1. Alopecia areata New- discussed course and tx. Advised observation for now.  He will keep me posted, if symptoms worsen, consider topical therapy. The patient indicates understanding of these issues and agrees with the plan.

## 2013-03-12 ENCOUNTER — Other Ambulatory Visit: Payer: Self-pay | Admitting: Family Medicine

## 2013-03-12 NOTE — Telephone Encounter (Signed)
Pt has infected tooth on molar on rt side, painful and when mashes gum at tooth pus comes out no fever ; pt request refill on pencillin to [facility].Please advise. Pt is making appt at [facility]; pt cannot get appt until Aug.

## 2013-03-25 ENCOUNTER — Telehealth: Payer: Self-pay

## 2013-03-25 NOTE — Telephone Encounter (Signed)
Pt left v/m; pt received letter from dental clinic at [facility]; pt is scheduled for dental cleaning 04/16/13. Pt was to call PCP to see if needed to be premedicated prior to dental appt due to heart condition and pt wants to know if should take his regular meds prior to going to dental appt.Please advise.Google. Pt said OK to wait for Dr Elmer Sow return.

## 2013-03-27 ENCOUNTER — Other Ambulatory Visit: Payer: Self-pay | Admitting: *Deleted

## 2013-03-27 NOTE — Telephone Encounter (Signed)
Will forward to his cardiologist, Dr. Excell Seltzer for cardiac input.

## 2013-03-27 NOTE — Telephone Encounter (Signed)
Faxed refill requests for alprazolam and vicodin, both last filled 02/26/13.

## 2013-03-28 MED ORDER — HYDROCODONE-ACETAMINOPHEN 10-325 MG PO TABS: 1.0000 | ORAL_TABLET | Freq: Three times a day (TID) | ORAL | Status: DC | PRN

## 2013-03-28 MED ORDER — ALPRAZOLAM 1 MG PO TABS: ORAL_TABLET | ORAL | Status: DC

## 2013-03-28 NOTE — Telephone Encounter (Signed)
Please see Dr. Earmon Phoenix note.

## 2013-03-28 NOTE — Telephone Encounter (Signed)
Medicines called to [facility].

## 2013-03-28 NOTE — Telephone Encounter (Signed)
Pt said for 2 years instructions on hydrocodone apap has been take 1 tab q4h prn for pain; this prescription said 1 tab q 8h prn for pain and pt wants to know reason for change in instructions.  Pt has chronic back and shoulder pain.Please advise. Pt request cb.

## 2013-03-28 NOTE — Telephone Encounter (Signed)
It should not have been changed and I am unsure as to why it was.  Can you see when that was changed?  I am ok with continuing his previous refills.  He has not broken any pain contracts and keeps his scheduled appointments.

## 2013-03-28 NOTE — Telephone Encounter (Signed)
Advised pharmacist. 

## 2013-03-28 NOTE — Telephone Encounter (Signed)
I haven't seen him since 2010, but I don't see anything in his history indicating a need for SBE prophylaxis. He has coronary stents and hx of CABG, but no hx of valvular or congenital heart disease.  thx

## 2013-03-28 NOTE — Telephone Encounter (Signed)
Advised pt as instructed. 

## 2013-04-29 ENCOUNTER — Encounter: Payer: Self-pay | Admitting: Family Medicine

## 2013-04-29 ENCOUNTER — Ambulatory Visit (INDEPENDENT_AMBULATORY_CARE_PROVIDER_SITE_OTHER): Payer: Medicare Other | Admitting: Family Medicine

## 2013-04-29 VITALS — BP 110/62 | HR 44 | Temp 97.9°F | Ht 72.0 in | Wt 192.0 lb

## 2013-04-29 DIAGNOSIS — E785 Hyperlipidemia, unspecified: Secondary | ICD-10-CM

## 2013-04-29 DIAGNOSIS — Z23 Encounter for immunization: Secondary | ICD-10-CM

## 2013-04-29 DIAGNOSIS — E119 Type 2 diabetes mellitus without complications: Secondary | ICD-10-CM

## 2013-04-29 DIAGNOSIS — I10 Essential (primary) hypertension: Secondary | ICD-10-CM

## 2013-04-29 LAB — MICROALBUMIN / CREATININE URINE RATIO
Microalb Creat Ratio: 1.1 mg/g (ref 0.0–30.0)
Microalb, Ur: 3.9 mg/dL — ABNORMAL HIGH (ref 0.0–1.9)

## 2013-04-29 LAB — COMPREHENSIVE METABOLIC PANEL
ALT: 22 U/L (ref 0–53)
AST: 28 U/L (ref 0–37)
CO2: 32 mEq/L (ref 19–32)
Calcium: 9.4 mg/dL (ref 8.4–10.5)
Chloride: 104 mEq/L (ref 96–112)
Creatinine, Ser: 1.4 mg/dL (ref 0.4–1.5)
Sodium: 140 mEq/L (ref 135–145)
Total Protein: 6.8 g/dL (ref 6.0–8.3)

## 2013-04-29 LAB — LIPID PANEL
HDL: 42.1 mg/dL (ref >39.00)
Total CHOL/HDL Ratio: 3
VLDL: 13.2 mg/dL (ref 0.0–40.0)

## 2013-04-29 LAB — HEMOGLOBIN A1C: Hgb A1c MFr Bld: 6.1 % (ref 4.6–6.5)

## 2013-04-29 MED ORDER — HYDROCODONE-ACETAMINOPHEN 10-325 MG PO TABS: 1.0000 | ORAL_TABLET | Freq: Three times a day (TID) | ORAL | Status: DC | PRN

## 2013-04-29 MED ORDER — ALPRAZOLAM 1 MG PO TABS: ORAL_TABLET | ORAL | Status: DC

## 2013-04-29 NOTE — Progress Notes (Signed)
57 yo pleasant male here for follow up.  He has been doing quite well.   Still loves riding his motorcycle especially now that the weather is cooling.   Only taking Alprazolam 1 mg at bedtime.  Denies any symptoms of depression.     DM- has been diet controlled since he gastric bypass in 2006 (lost 200 lb).    Weight stable.   Lab Results  Component Value Date   HGBA1C 5.9 10/31/2012    Not on ACEI.  Denies any symptoms of hyper or hypo glycemia.    HLD-  On Zocor 20 mg daily.  Never had issues with myalgias.  Well controlled.   Lab Results  Component Value Date   CHOL 130 10/31/2012   HDL 37.20* 10/31/2012   LDLCALC 82 10/31/2012   TRIG 55.0 10/31/2012   CHOLHDL 3 10/31/2012   Chronic back pain- no indication of abuse, has followed pain contract.  Feels pain is well controlled on current medication. Patient Active Problem List   Diagnosis Date Noted  . Alopecia areata 02/26/2013  . VITAMIN D DEFICIENCY 09/03/2009  . CAD, ARTERY BYPASS GRAFT 05/20/2009  . DIABETES MELLITUS, TYPE II 12/11/2006  . HYPERLIPIDEMIA 12/11/2006  . ANEMIA-IRON DEFICIENCY 12/11/2006  . TOBACCO ABUSE 12/11/2006  . HYPERTENSION 12/11/2006  . MYOCARDIAL INFARCTION, HX OF 12/11/2006  . CARDIOMYOPATHY, ISCHEMIC 12/11/2006  . PULMONARY EMBOLISM, HX OF 12/11/2006   Past Medical History  Diagnosis Date  . Diabetes mellitus   . Hyperlipidemia   . Hypertension   . Low back pain   . MI (myocardial infarction)   . Arthritis   . Tobacco abuse   . Left ventricular dysfunction   . Obesity   . CAD (coronary artery disease)   . PE (pulmonary embolism)   . Anemia    Past Surgical History  Procedure Laterality Date  . Coronary stent placement      3 all in right side  . Gastric bypass    . Coronary artery bypass graft    . Cholecystectomy    . Hernia repair     History  Substance Use Topics  . Smoking status: Current Every Day Smoker -- 1.00 packs/day for 40 years    Types: Cigarettes  . Smokeless  tobacco: Former Neurosurgeon  . Alcohol Use: No   Family History  Problem Relation Age of Onset  . Emphysema Mother   . Heart attack Father   . Heart disease Daughter   . Heart disease Brother   . Arthritis Brother   . Hemophilia Brother    Allergies  Allergen Reactions  . Codeine     REACTION: makes me "looney"   Current Outpatient Prescriptions on File Prior to Visit  Medication Sig Dispense Refill  . ALPRAZolam (XANAX) 1 MG tablet TAKE ONE TABLET BY MOUTH THREE TIMES DAILY AS NEEDED FOR ANXIETY  90 tablet  0  . aspirin 81 MG tablet Take 81 mg by mouth daily.        . Calcium Citrate-Vitamin D 500-400 MG-UNIT CHEW Chew 1 tablet by mouth 2 (two) times daily.        . Cholecalciferol (VITAMIN D) 2000 UNITS tablet Take 5,000 Units by mouth daily.       Marland Kitchen docusate sodium (COLACE) 100 MG capsule Take 100 mg by mouth as needed.        . fexofenadine (ALLEGRA) 180 MG tablet Take 180 mg by mouth daily as needed.      Marland Kitchen HYDROcodone-acetaminophen (NORCO) 10-325  MG per tablet Take 1 tablet by mouth every 8 (eight) hours as needed for pain.  150 tablet  0  . metoprolol succinate (TOPROL-XL) 25 MG 24 hr tablet Take 1/2 by mouth two times a day      . Multiple Vitamin (MULTIVITAMIN) tablet Take 1 tablet by mouth daily.        . nitroGLYCERIN (NITROSTAT) 0.3 MG SL tablet Place 1 tablet (0.3 mg total) under the tongue every 5 (five) minutes as needed.  90 tablet  3  . penicillin v potassium (VEETID) 500 MG tablet TAKE 1 TABLET BY MOUTH THREE TIMES DAILY FOR 10 DAYS  30 tablet  0  . simvastatin (ZOCOR) 40 MG tablet Take 20 mg by mouth at bedtime.       No current facility-administered medications on file prior to visit.   The PMH, PSH, Social History, Family History, Medications, and allergies have been reviewed in [facility], and have been updated if relevant.   Review of Systems       See HPI   Physical Exam BP 110/62  Pulse 44  Temp(Src) 97.9 F (36.6 C)  Ht 6' (1.829 m)  Wt 192 lb (87.091 kg)   BMI 26.03 kg/m2   Wt Readings from Last 3 Encounters:  04/29/13 192 lb (87.091 kg)  02/26/13 199 lb (90.266 kg)  01/31/13 198 lb (89.812 kg)   General:  overweght male in NAD Eyes:  PERRL Ears:  External ear exam shows no significant lesions or deformities.  Otoscopic examination reveals clear canals, tympanic membranes are intact bilaterally without bulging, retraction, inflammation or discharge. Hearing is grossly normal bilaterally. Nose:  External nasal examination shows no deformity or inflammation. Nasal mucosa are pink and moist without lesions or exudates. Neck:  no carotid bruit or thyromegaly no cervical or supraclavicular lymphadenopathy  Lungs:  Normal respiratory effort, chest expands symmetrically. Lungs are clear to auscultation, no crackles or wheezes. Heart:  Normal rate and regular rhythm. S1 and S2 normal without gallop, murmur, click, rub or other extra sounds. Abdomen:  Bowel sounds positive,abdomen soft and non-tender without masses, organomegaly or hernias noted. Pulses:  R and L posterior tibial pulses are full and equal bilaterally  Extremities:  no edema  Diabetic foot exam: Normal inspection No skin breakdown No calluses  Normal DP pulses Normal sensation to light touch and monofilament Nails normal    Assessment and Plan:   1. HYPERLIPIDEMIA Recheck labs today. - Comprehensive metabolic panel - Lipid Panel  2. HYPERTENSION Stable, no changes.  3. DIABETES MELLITUS, TYPE II Flu shot today. Check labs. Diet controlled. - HM Diabetes Foot Exam - Hemoglobin A1c - Microalbumin/Creatinine Ratio, Urine

## 2013-04-29 NOTE — Patient Instructions (Signed)
Great to see you.  We will call you with your lab results. 

## 2013-04-29 NOTE — Addendum Note (Signed)
Addended by: Eliezer Bottom on: 04/29/2013 09:21 AM   Modules accepted: Orders

## 2013-06-03 ENCOUNTER — Other Ambulatory Visit: Payer: Self-pay

## 2013-06-03 MED ORDER — HYDROCODONE-ACETAMINOPHEN 10-325 MG PO TABS: 1.0000 | ORAL_TABLET | Freq: Three times a day (TID) | ORAL | Status: DC | PRN

## 2013-06-03 NOTE — Telephone Encounter (Signed)
Pt left v/m requesting rx hydrocodone apap. Call pt when rx ready for pick up. Pt would like to pick up rx on 06/04/13.

## 2013-06-04 ENCOUNTER — Other Ambulatory Visit: Payer: Self-pay | Admitting: Family Medicine

## 2013-06-04 MED ORDER — HYDROCODONE-ACETAMINOPHEN 10-325 MG PO TABS: 1.0000 | ORAL_TABLET | Freq: Three times a day (TID) | ORAL | Status: DC | PRN

## 2013-06-04 NOTE — Addendum Note (Signed)
Addended by: Joaquim Nam on: 06/04/2013 08:21 AM   Modules accepted: Orders

## 2013-06-04 NOTE — Telephone Encounter (Signed)
Patient advised.  Rx left at front desk for pick up. 

## 2013-06-04 NOTE — Telephone Encounter (Signed)
Printed

## 2013-06-04 NOTE — Telephone Encounter (Signed)
Prescription did not print. Last office visit 04/29/13. Is it okay to refill medication? Dr. Dayton Martes is out.

## 2013-06-05 ENCOUNTER — Other Ambulatory Visit: Payer: Self-pay | Admitting: *Deleted

## 2013-06-05 MED ORDER — ALPRAZOLAM 1 MG PO TABS: ORAL_TABLET | ORAL | Status: DC

## 2013-06-05 NOTE — Telephone Encounter (Signed)
Called to [facility] 

## 2013-06-05 NOTE — Telephone Encounter (Signed)
Ok to refill 

## 2013-06-05 NOTE — Telephone Encounter (Signed)
Last office visit 04/29/2013.  Ok to refill?

## 2013-06-06 ENCOUNTER — Telehealth: Payer: Self-pay

## 2013-06-06 NOTE — Telephone Encounter (Signed)
Pt is considering requesting to be seen monthly since pt has to come in anyway to pick up pain prescription. Pt said he will wait for Dr Elmer Sow return to decide about this and for now will leave things as is.

## 2013-06-21 ENCOUNTER — Encounter: Payer: Self-pay | Admitting: Family Medicine

## 2013-07-02 ENCOUNTER — Other Ambulatory Visit: Payer: Self-pay

## 2013-07-02 MED ORDER — HYDROCODONE-ACETAMINOPHEN 10-325 MG PO TABS: 1.0000 | ORAL_TABLET | Freq: Three times a day (TID) | ORAL | Status: DC | PRN

## 2013-07-02 MED ORDER — ALPRAZOLAM 1 MG PO TABS: ORAL_TABLET | ORAL | Status: DC

## 2013-07-02 NOTE — Telephone Encounter (Signed)
Pt informed that RX ready to be picked up. 

## 2013-07-02 NOTE — Telephone Encounter (Signed)
Pt left v/m requesting rx alprazolam and hydrocodone apap. Call when ready for pick up. 

## 2013-07-02 NOTE — Telephone Encounter (Signed)
To blair to call pt

## 2013-07-03 NOTE — Telephone Encounter (Signed)
Pt called to ck on status of refills; advised pt rx at front desk for pick up; pt said would pick up by end of week.

## 2013-08-05 ENCOUNTER — Telehealth: Payer: Self-pay

## 2013-08-05 ENCOUNTER — Encounter: Payer: Self-pay | Admitting: Internal Medicine

## 2013-08-05 ENCOUNTER — Other Ambulatory Visit: Payer: Self-pay | Admitting: *Deleted

## 2013-08-05 MED ORDER — ALPRAZOLAM 1 MG PO TABS: ORAL_TABLET | ORAL | Status: DC

## 2013-08-05 MED ORDER — HYDROCODONE-ACETAMINOPHEN 10-325 MG PO TABS: 1.0000 | ORAL_TABLET | Freq: Three times a day (TID) | ORAL | Status: DC | PRN

## 2013-08-05 NOTE — Telephone Encounter (Signed)
Ok to phone in medication

## 2013-08-05 NOTE — Telephone Encounter (Signed)
Left message on voicemail for pt to return call. Patient has a Rx ready for pick up

## 2013-08-05 NOTE — Telephone Encounter (Signed)
Rx left in front office for pick up and pt is aware  

## 2013-08-05 NOTE — Telephone Encounter (Signed)
Received faxed refill request from pharmacy. Last refill 07/02/13 #90, last office visit 04/29/13. Is it okay to refill medication?

## 2013-08-05 NOTE — Telephone Encounter (Signed)
Rx called in to pharmacy. 

## 2013-08-05 NOTE — Telephone Encounter (Signed)
Pt request rx hydrocodone apap. Call when ready for pick up. 

## 2013-09-02 ENCOUNTER — Encounter: Payer: Self-pay | Admitting: Family Medicine

## 2013-09-02 ENCOUNTER — Ambulatory Visit (INDEPENDENT_AMBULATORY_CARE_PROVIDER_SITE_OTHER)
Admission: RE | Admit: 2013-09-02 | Discharge: 2013-09-02 | Disposition: A | Discharge status: Home / Self Care | Payer: Medicare HMO | Source: Ambulatory Visit | Attending: Family Medicine | Admitting: Family Medicine

## 2013-09-02 ENCOUNTER — Ambulatory Visit (INDEPENDENT_AMBULATORY_CARE_PROVIDER_SITE_OTHER): Payer: Medicare HMO | Admitting: Family Medicine

## 2013-09-02 VITALS — BP 106/58 | HR 49 | Temp 97.4°F | Ht 71.0 in | Wt 201.5 lb

## 2013-09-02 DIAGNOSIS — R0989 Other specified symptoms and signs involving the circulatory and respiratory systems: Secondary | ICD-10-CM

## 2013-09-02 DIAGNOSIS — R918 Other nonspecific abnormal finding of lung field: Secondary | ICD-10-CM | POA: Insufficient documentation

## 2013-09-02 DIAGNOSIS — E559 Vitamin D deficiency, unspecified: Secondary | ICD-10-CM

## 2013-09-02 DIAGNOSIS — E785 Hyperlipidemia, unspecified: Secondary | ICD-10-CM

## 2013-09-02 DIAGNOSIS — I10 Essential (primary) hypertension: Secondary | ICD-10-CM

## 2013-09-02 DIAGNOSIS — G894 Chronic pain syndrome: Secondary | ICD-10-CM | POA: Insufficient documentation

## 2013-09-02 DIAGNOSIS — E119 Type 2 diabetes mellitus without complications: Secondary | ICD-10-CM

## 2013-09-02 MED ORDER — HYDROCODONE-ACETAMINOPHEN 10-325 MG PO TABS: 1.0000 | ORAL_TABLET | Freq: Three times a day (TID) | ORAL | Status: DC | PRN

## 2013-09-02 MED ORDER — ALPRAZOLAM 1 MG PO TABS: ORAL_TABLET | ORAL | Status: DC

## 2013-09-02 NOTE — Assessment & Plan Note (Signed)
CXR

## 2013-09-02 NOTE — Assessment & Plan Note (Signed)
Diet controlled.  

## 2013-09-02 NOTE — Assessment & Plan Note (Signed)
Norco rx refilled. UDS UTD.

## 2013-09-02 NOTE — Progress Notes (Signed)
Pre-visit discussion using our clinic review tool. No additional management support is needed unless otherwise documented below in the visit note.  

## 2013-09-02 NOTE — Assessment & Plan Note (Signed)
Stable current rx. No changes.

## 2013-09-02 NOTE — Progress Notes (Signed)
58 yo pleasant male here for follow up.  He has been doing quite well.   Still loves riding his motorcycle especially now that the weather is cooling.   Only taking Alprazolam 1 mg at bedtime.  Denies any symptoms of depression.     DM- has been diet controlled since he gastric bypass in 2006 (lost 200 lb).    Weight stable.   Lab Results  Component Value Date   HGBA1C 6.1 04/29/2013    Not on ACEI.  Denies any symptoms of hyper or hypo glycemia.    HLD-  On Zocor 20 mg daily.  Never had issues with myalgias.  Well controlled.   Lab Results  Component Value Date   CHOL 129 04/29/2013   HDL 42.10 04/29/2013   LDLCALC 74 04/29/2013   TRIG 66.0 04/29/2013   CHOLHDL 3 04/29/2013   Chronic back pain- no indication of abuse, has followed pain contract.  Feels pain is well controlled on current medication. Patient Active Problem List   Diagnosis Date Noted  . Alopecia areata 02/26/2013  . VITAMIN D DEFICIENCY 09/03/2009  . CAD, ARTERY BYPASS GRAFT 05/20/2009  . DIABETES MELLITUS, TYPE II 12/11/2006  . HYPERLIPIDEMIA 12/11/2006  . ANEMIA-IRON DEFICIENCY 12/11/2006  . TOBACCO ABUSE 12/11/2006  . HYPERTENSION 12/11/2006  . MYOCARDIAL INFARCTION, HX OF 12/11/2006  . CARDIOMYOPATHY, ISCHEMIC 12/11/2006  . PULMONARY EMBOLISM, HX OF 12/11/2006   Past Medical History  Diagnosis Date  . Diabetes mellitus   . Hyperlipidemia   . Hypertension   . Low back pain   . MI (myocardial infarction)   . Arthritis   . Tobacco abuse   . Left ventricular dysfunction   . Obesity   . CAD (coronary artery disease)   . PE (pulmonary embolism)   . Anemia    Past Surgical History  Procedure Laterality Date  . Coronary stent placement      3 all in right side  . Gastric bypass    . Coronary artery bypass graft    . Cholecystectomy    . Hernia repair     History  Substance Use Topics  . Smoking status: Current Every Day Smoker -- 1.00 packs/day for 40 years    Types: Cigarettes  . Smokeless  tobacco: Former Systems developer  . Alcohol Use: No   Family History  Problem Relation Age of Onset  . Emphysema Mother   . Heart attack Father   . Heart disease Daughter   . Heart disease Brother   . Arthritis Brother   . Hemophilia Brother    Allergies  Allergen Reactions  . Codeine     REACTION: makes me "looney"   Current Outpatient Prescriptions on File Prior to Visit  Medication Sig Dispense Refill  . ALPRAZolam (XANAX) 1 MG tablet TAKE ONE TABLET BY MOUTH THREE TIMES DAILY AS NEEDED FOR ANXIETY  90 tablet  0  . aspirin 81 MG tablet Take 81 mg by mouth daily.        . Calcium Citrate-Vitamin D 500-400 MG-UNIT CHEW Chew 1 tablet by mouth 2 (two) times daily.        . Cholecalciferol (VITAMIN D) 2000 UNITS tablet Take 5,000 Units by mouth daily.       Marland Kitchen docusate sodium (COLACE) 100 MG capsule Take 100 mg by mouth as needed.        . fexofenadine (ALLEGRA) 180 MG tablet Take 180 mg by mouth daily as needed.      Marland Kitchen HYDROcodone-acetaminophen (NORCO) 10-325  MG per tablet Take 1 tablet by mouth every 8 (eight) hours as needed.  150 tablet  0  . metoprolol succinate (TOPROL-XL) 25 MG 24 hr tablet TAKE 1 TABLET BY MOUTH TWICE DAILY  60 tablet  5  . Multiple Vitamin (MULTIVITAMIN) tablet Take 1 tablet by mouth daily.        . nitroGLYCERIN (NITROSTAT) 0.3 MG SL tablet Place 1 tablet (0.3 mg total) under the tongue every 5 (five) minutes as needed.  90 tablet  3  . simvastatin (ZOCOR) 40 MG tablet Take 20 mg by mouth at bedtime.       No current facility-administered medications on file prior to visit.   The PMH, PSH, Social History, Family History, Medications, and allergies have been reviewed in [facility], and have been updated if relevant.   Review of Systems       See HPI   Physical Exam BP 106/58  Pulse 49  Temp(Src) 97.4 F (36.3 C) (Oral)  Ht 5\' 11"  (1.803 m)  Wt 201 lb 8 oz (91.4 kg)  BMI 28.12 kg/m2  SpO2 94%   Wt Readings from Last 3 Encounters:  09/02/13 201 lb 8 oz (91.4 kg)   04/29/13 192 lb (87.091 kg)  02/26/13 199 lb (90.266 kg)   General:  overweght male in NAD Eyes:  PERRL Ears:  External ear exam shows no significant lesions or deformities.  Otoscopic examination reveals clear canals, tympanic membranes are intact bilaterally without bulging, retraction, inflammation or discharge. Hearing is grossly normal bilaterally. Nose:  External nasal examination shows no deformity or inflammation. Nasal mucosa are pink and moist without lesions or exudates. Neck:  no carotid bruit or thyromegaly no cervical or supraclavicular lymphadenopathy  Lungs:  Normal respiratory effort, chest expands symmetrically.  RLL insp wheeze, otherwise unremarkable Heart:  Normal rate and regular rhythm. S1 and S2 normal without gallop, murmur, click, rub or other extra sounds. Abdomen:  Bowel sounds positive,abdomen soft and non-tender without masses, organomegaly or hernias noted. Pulses:  R and L posterior tibial pulses are full and equal bilaterally   Assessment and Plan:

## 2013-09-02 NOTE — Patient Instructions (Signed)
Good to see you. I will call you with your xray results soon.

## 2013-09-30 ENCOUNTER — Other Ambulatory Visit: Payer: Self-pay

## 2013-09-30 ENCOUNTER — Encounter: Payer: Self-pay | Admitting: Family Medicine

## 2013-09-30 MED ORDER — ALPRAZOLAM 1 MG PO TABS: ORAL_TABLET | ORAL | Status: DC

## 2013-09-30 MED ORDER — HYDROCODONE-ACETAMINOPHEN 10-325 MG PO TABS: 1.0000 | ORAL_TABLET | Freq: Three times a day (TID) | ORAL | Status: DC | PRN

## 2013-09-30 NOTE — Telephone Encounter (Signed)
Pt request rx alprazolam and hydrocodone apap. Pt requesting early due to upcoming weather.call when ready for pick up.

## 2013-09-30 NOTE — Telephone Encounter (Signed)
Spoke to pt and informed him that Rx is available for pickup at the front desk. Pt informed a gov't issued photo id required for pickup

## 2013-10-25 ENCOUNTER — Encounter: Payer: Self-pay | Admitting: Family Medicine

## 2013-10-25 ENCOUNTER — Other Ambulatory Visit: Payer: Self-pay | Admitting: Family Medicine

## 2013-10-25 DIAGNOSIS — Z125 Encounter for screening for malignant neoplasm of prostate: Secondary | ICD-10-CM

## 2013-10-25 DIAGNOSIS — E785 Hyperlipidemia, unspecified: Secondary | ICD-10-CM

## 2013-10-25 DIAGNOSIS — D509 Iron deficiency anemia, unspecified: Secondary | ICD-10-CM

## 2013-10-25 DIAGNOSIS — E119 Type 2 diabetes mellitus without complications: Secondary | ICD-10-CM

## 2013-10-29 ENCOUNTER — Other Ambulatory Visit: Payer: Self-pay | Admitting: *Deleted

## 2013-10-29 MED ORDER — HYDROCODONE-ACETAMINOPHEN 10-325 MG PO TABS: 1.0000 | ORAL_TABLET | Freq: Three times a day (TID) | ORAL | Status: DC | PRN

## 2013-10-29 MED ORDER — ALPRAZOLAM 1 MG PO TABS: ORAL_TABLET | ORAL | Status: DC

## 2013-10-29 NOTE — Telephone Encounter (Signed)
Spoke to pt and informed him Rx is available for pickup;pt informed gov't issued photo id required for pickup

## 2013-10-29 NOTE — Telephone Encounter (Signed)
Patient left a voicemail that he needs refills on Hydrocodone and Alprazolam. Patient is coming to the office for lab work on 10/31/13 and would like to pick them up at that time.

## 2013-10-31 ENCOUNTER — Other Ambulatory Visit (INDEPENDENT_AMBULATORY_CARE_PROVIDER_SITE_OTHER): Payer: Medicare HMO

## 2013-10-31 DIAGNOSIS — Z125 Encounter for screening for malignant neoplasm of prostate: Secondary | ICD-10-CM

## 2013-10-31 DIAGNOSIS — D509 Iron deficiency anemia, unspecified: Secondary | ICD-10-CM

## 2013-10-31 DIAGNOSIS — E119 Type 2 diabetes mellitus without complications: Secondary | ICD-10-CM

## 2013-10-31 LAB — CBC WITH DIFFERENTIAL/PLATELET
BASOS PCT: 0.8 % (ref 0.0–3.0)
Basophils Absolute: 0 10*3/uL (ref 0.0–0.1)
Eosinophils Absolute: 0.2 10*3/uL (ref 0.0–0.7)
Eosinophils Relative: 3.4 % (ref 0.0–5.0)
HEMATOCRIT: 42.2 % (ref 39.0–52.0)
Hemoglobin: 14.1 g/dL (ref 13.0–17.0)
LYMPHS ABS: 2.2 10*3/uL (ref 0.7–4.0)
Lymphocytes Relative: 36.5 % (ref 12.0–46.0)
MCHC: 33.4 g/dL (ref 30.0–36.0)
MCV: 94.3 fl (ref 78.0–100.0)
MONO ABS: 0.5 10*3/uL (ref 0.1–1.0)
Monocytes Relative: 8.6 % (ref 3.0–12.0)
Neutro Abs: 3.1 10*3/uL (ref 1.4–7.7)
Neutrophils Relative %: 50.7 % (ref 43.0–77.0)
Platelets: 176 10*3/uL (ref 150.0–400.0)
RBC: 4.48 Mil/uL (ref 4.22–5.81)
RDW: 13.9 % (ref 11.5–14.6)
WBC: 6.1 10*3/uL (ref 4.5–10.5)

## 2013-10-31 LAB — LIPID PANEL
CHOLESTEROL: 140 mg/dL (ref 0–200)
HDL: 39.8 mg/dL (ref >39.00)
LDL Cholesterol: 87 mg/dL (ref 0–99)
TRIGLYCERIDES: 68 mg/dL (ref 0.0–149.0)
Total CHOL/HDL Ratio: 4
VLDL: 13.6 mg/dL (ref 0.0–40.0)

## 2013-10-31 LAB — COMPREHENSIVE METABOLIC PANEL
ALBUMIN: 4 g/dL (ref 3.5–5.2)
ALT: 17 U/L (ref 0–53)
AST: 26 U/L (ref 0–37)
Alkaline Phosphatase: 50 U/L (ref 39–117)
BUN: 18 mg/dL (ref 6–23)
CO2: 30 mEq/L (ref 19–32)
Calcium: 9.3 mg/dL (ref 8.4–10.5)
Chloride: 104 mEq/L (ref 96–112)
Creatinine, Ser: 1.2 mg/dL (ref 0.4–1.5)
GFR: 64.61 mL/min (ref >60.00)
GLUCOSE: 108 mg/dL — AB (ref 70–99)
Potassium: 4.6 mEq/L (ref 3.5–5.1)
Sodium: 141 mEq/L (ref 135–145)
Total Bilirubin: 0.4 mg/dL (ref 0.3–1.2)
Total Protein: 7.2 g/dL (ref 6.0–8.3)

## 2013-10-31 LAB — PSA, MEDICARE: PSA: 0.45 ng/ml (ref 0.10–4.00)

## 2013-10-31 LAB — HEMOGLOBIN A1C: Hgb A1c MFr Bld: 6 % (ref 4.6–6.5)

## 2013-11-06 ENCOUNTER — Ambulatory Visit (INDEPENDENT_AMBULATORY_CARE_PROVIDER_SITE_OTHER): Payer: Medicare HMO | Admitting: Family Medicine

## 2013-11-06 ENCOUNTER — Encounter: Payer: Self-pay | Admitting: Family Medicine

## 2013-11-06 ENCOUNTER — Telehealth: Payer: Self-pay | Admitting: Family Medicine

## 2013-11-06 VITALS — BP 126/88 | HR 43 | Temp 97.8°F | Ht 70.0 in | Wt 199.8 lb

## 2013-11-06 DIAGNOSIS — E785 Hyperlipidemia, unspecified: Secondary | ICD-10-CM

## 2013-11-06 DIAGNOSIS — Z0000 Encounter for general adult medical examination without abnormal findings: Secondary | ICD-10-CM

## 2013-11-06 DIAGNOSIS — I2589 Other forms of chronic ischemic heart disease: Secondary | ICD-10-CM

## 2013-11-06 DIAGNOSIS — E119 Type 2 diabetes mellitus without complications: Secondary | ICD-10-CM

## 2013-11-06 DIAGNOSIS — Z1211 Encounter for screening for malignant neoplasm of colon: Secondary | ICD-10-CM

## 2013-11-06 DIAGNOSIS — G894 Chronic pain syndrome: Secondary | ICD-10-CM

## 2013-11-06 NOTE — Assessment & Plan Note (Signed)
The patients weight, height, BMI and visual acuity have been recorded in the chart I have made referrals, counseling and provided education to the patient based review of the above and I have provided the pt with a written personalized care plan for preventive services.  

## 2013-11-06 NOTE — Telephone Encounter (Signed)
Relevant patient education assigned to patient using Emmi. ° °

## 2013-11-06 NOTE — Progress Notes (Signed)
Pre visit review using our clinic review tool, if applicable. No additional management support is needed unless otherwise documented below in the visit note. 

## 2013-11-06 NOTE — Progress Notes (Signed)
Very pleasant 58 yo here for annual medicare wellness visit.  I have personally reviewed the Medicare Annual Wellness questionnaire and have noted 1. The patient's medical and social history 2. Their use of alcohol, tobacco or illicit drugs 3. Their current medications and supplements 4. The patient's functional ability including ADL's, fall risks, home safety risks and hearing or visual             impairment. 5. Diet and physical activities 6. Evidence for depression or mood disorders  End of life wishes discussed and updated in Social History.  Pneumovax 10/31/2012 Td 07/23/2008  Last EKG 3/18/214  Has never had a colonoscopy, not willing to have one.  He is willing to do stool cards.  He has been doing quite well. Staying active in his shop.  Still loves riding his motorcycle whenever he can.    Only taking Alprazolam 1 mg at bedtime.  Denies any symptoms of depression.     DM- has been diet controlled since he gastric bypass in 2006 (lost 200 lb).    Weight stable.   Lab Results  Component Value Date   HGBA1C 6.0 10/31/2013     Denies any symptoms of hyper or hypo glycemia.    HLD-  On Zocor 20 mg daily.  Never had issues with myalgias.  Well controlled.   Lab Results  Component Value Date   CHOL 140 10/31/2013   HDL 39.80 10/31/2013   LDLCALC 87 10/31/2013   TRIG 68.0 10/31/2013   CHOLHDL 4 10/31/2013    Patient Active Problem List   Diagnosis Date Noted  . Other nonspecific abnormal finding of lung field 09/02/2013  . Chronic pain syndrome 09/02/2013  . Alopecia areata 02/26/2013  . VITAMIN D DEFICIENCY 09/03/2009  . CAD, ARTERY BYPASS GRAFT 05/20/2009  . DIABETES MELLITUS, TYPE II 12/11/2006  . HYPERLIPIDEMIA 12/11/2006  . ANEMIA-IRON DEFICIENCY 12/11/2006  . TOBACCO ABUSE 12/11/2006  . HYPERTENSION 12/11/2006  . MYOCARDIAL INFARCTION, HX OF 12/11/2006  . CARDIOMYOPATHY, ISCHEMIC 12/11/2006  . PULMONARY EMBOLISM, HX OF 12/11/2006   Past Medical History   Diagnosis Date  . Diabetes mellitus   . Hyperlipidemia   . Hypertension   . Low back pain   . MI (myocardial infarction)   . Arthritis   . Tobacco abuse   . Left ventricular dysfunction   . Obesity   . CAD (coronary artery disease)   . PE (pulmonary embolism)   . Anemia    Past Surgical History  Procedure Laterality Date  . Coronary stent placement      3 all in right side  . Gastric bypass    . Coronary artery bypass graft    . Cholecystectomy    . Hernia repair     History  Substance Use Topics  . Smoking status: Current Every Day Smoker -- 1.00 packs/day for 40 years    Types: Cigarettes  . Smokeless tobacco: Former Systems developer  . Alcohol Use: No   Family History  Problem Relation Age of Onset  . Emphysema Mother   . Heart attack Father   . Heart disease Daughter   . Heart disease Brother   . Arthritis Brother   . Hemophilia Brother    Allergies  Allergen Reactions  . Codeine     REACTION: makes me "looney"   Current Outpatient Prescriptions on File Prior to Visit  Medication Sig Dispense Refill  . ALPRAZolam (XANAX) 1 MG tablet TAKE ONE TABLET BY MOUTH THREE TIMES DAILY AS NEEDED  FOR ANXIETY  90 tablet  0  . aspirin 81 MG tablet Take 81 mg by mouth daily.        . Calcium Citrate-Vitamin D 500-400 MG-UNIT CHEW Chew 1 tablet by mouth 2 (two) times daily.        . Cholecalciferol (VITAMIN D) 2000 UNITS tablet Take 5,000 Units by mouth daily.       Marland Kitchen docusate sodium (COLACE) 100 MG capsule Take 100 mg by mouth as needed.        . fexofenadine (ALLEGRA) 180 MG tablet Take 180 mg by mouth daily as needed.      Marland Kitchen HYDROcodone-acetaminophen (NORCO) 10-325 MG per tablet Take 1 tablet by mouth every 8 (eight) hours as needed.  150 tablet  0  . metoprolol succinate (TOPROL-XL) 25 MG 24 hr tablet TAKE 1 TABLET BY MOUTH TWICE DAILY  60 tablet  5  . Multiple Vitamin (MULTIVITAMIN) tablet Take 1 tablet by mouth daily.        . nitroGLYCERIN (NITROSTAT) 0.3 MG SL tablet Place 1  tablet (0.3 mg total) under the tongue every 5 (five) minutes as needed.  90 tablet  3  . simvastatin (ZOCOR) 40 MG tablet Take 20 mg by mouth at bedtime.       No current facility-administered medications on file prior to visit.   The PMH, PSH, Social History, Family History, Medications, and allergies have been reviewed in [facility], and have been updated if relevant.   Review of Systems       See HPI   Physical Exam BP 126/88  Pulse 43  Temp(Src) 97.8 F (36.6 C) (Oral)  Ht 5\' 10"  (1.778 m)  Wt 199 lb 12 oz (90.606 kg)  BMI 28.66 kg/m2  SpO2 97% Wt Readings from Last 3 Encounters:  11/06/13 199 lb 12 oz (90.606 kg)  09/02/13 201 lb 8 oz (91.4 kg)  04/29/13 192 lb (87.091 kg)    General: pleasant male in NAD Eyes:  PERRL Ears:  External ear exam shows no significant lesions or deformities.  Otoscopic examination reveals clear canals, tympanic membranes are intact bilaterally without bulging, retraction, inflammation or discharge. Hearing is grossly normal bilaterally. Nose:  External nasal examination shows no deformity or inflammation. Nasal mucosa are pink and moist without lesions or exudates. Neck:  no carotid bruit or thyromegaly no cervical or supraclavicular lymphadenopathy  Lungs:  Normal respiratory effort, chest expands symmetrically. Lungs are clear to auscultation, no crackles or wheezes. Heart:  Normal rate and regular rhythm. S1 and S2 normal without gallop, murmur, click, rub or other extra sounds. Abdomen:  Bowel sounds positive,abdomen soft and non-tender without masses, organomegaly or hernias noted. Pulses:  R and L posterior tibial pulses are full and equal bilaterally  Extremities:  no edema   Assessment and Plan:

## 2013-11-06 NOTE — Assessment & Plan Note (Signed)
Well controlled. No changes. 

## 2013-11-06 NOTE — Assessment & Plan Note (Signed)
Diet controlled.  

## 2013-11-06 NOTE — Assessment & Plan Note (Signed)
Continue current rx Has not seen cards in several years, he will make appt.

## 2013-11-06 NOTE — Assessment & Plan Note (Signed)
UTD UDS.

## 2013-11-06 NOTE — Patient Instructions (Addendum)
It was good to see you. Please make an appointment with Dr. Burt Knack (your heart doctor).  We will call you with your stool card results.

## 2013-11-14 ENCOUNTER — Telehealth: Payer: Self-pay

## 2013-11-14 NOTE — Telephone Encounter (Signed)
Relevant patient education assigned to patient using Emmi. ° °

## 2013-11-21 ENCOUNTER — Other Ambulatory Visit (INDEPENDENT_AMBULATORY_CARE_PROVIDER_SITE_OTHER): Payer: Medicare HMO

## 2013-11-21 ENCOUNTER — Encounter: Payer: Self-pay | Admitting: *Deleted

## 2013-11-21 DIAGNOSIS — Z1211 Encounter for screening for malignant neoplasm of colon: Secondary | ICD-10-CM

## 2013-11-21 LAB — FECAL OCCULT BLOOD, IMMUNOCHEMICAL: FECAL OCCULT BLD: NEGATIVE

## 2013-11-29 ENCOUNTER — Other Ambulatory Visit: Payer: Self-pay

## 2013-11-29 MED ORDER — HYDROCODONE-ACETAMINOPHEN 10-325 MG PO TABS: 1.0000 | ORAL_TABLET | Freq: Three times a day (TID) | ORAL | Status: DC | PRN

## 2013-11-29 MED ORDER — ALPRAZOLAM 1 MG PO TABS: ORAL_TABLET | ORAL | Status: DC

## 2013-11-29 NOTE — Telephone Encounter (Signed)
Ok to print and put in my box for signature. 

## 2013-11-29 NOTE — Telephone Encounter (Signed)
Printed and placed in Dr Hulen Shouts box for [employer]. Spoke to pt and informed him it will be available for pickup after 1300.

## 2013-11-29 NOTE — Telephone Encounter (Signed)
Pt left v/m requesting rx alprazolam and hydrocodone apap. Call when ready for pick up. 

## 2013-12-30 ENCOUNTER — Other Ambulatory Visit: Payer: Self-pay

## 2013-12-30 MED ORDER — ALPRAZOLAM 1 MG PO TABS: ORAL_TABLET | ORAL | Status: DC

## 2013-12-30 MED ORDER — HYDROCODONE-ACETAMINOPHEN 10-325 MG PO TABS: 1.0000 | ORAL_TABLET | Freq: Three times a day (TID) | ORAL | Status: DC | PRN

## 2013-12-30 NOTE — Telephone Encounter (Signed)
Pt left v/m requesting rx hydrocodone apap and alprazolam.Call when ready for pick up.  

## 2013-12-30 NOTE — Telephone Encounter (Signed)
Spoke to pt and informed him Rx is available for pickup; informed a govt issued photo id required

## 2014-01-23 ENCOUNTER — Ambulatory Visit (INDEPENDENT_AMBULATORY_CARE_PROVIDER_SITE_OTHER): Payer: Medicare HMO | Admitting: Family Medicine

## 2014-01-23 ENCOUNTER — Encounter: Payer: Self-pay | Admitting: Family Medicine

## 2014-01-23 ENCOUNTER — Telehealth: Payer: Self-pay | Admitting: Family Medicine

## 2014-01-23 VITALS — BP 132/72 | HR 45 | Temp 97.5°F | Wt 193.5 lb

## 2014-01-23 DIAGNOSIS — I10 Essential (primary) hypertension: Secondary | ICD-10-CM

## 2014-01-23 DIAGNOSIS — I25810 Atherosclerosis of coronary artery bypass graft(s) without angina pectoris: Secondary | ICD-10-CM

## 2014-01-23 DIAGNOSIS — E119 Type 2 diabetes mellitus without complications: Secondary | ICD-10-CM

## 2014-01-23 DIAGNOSIS — G894 Chronic pain syndrome: Secondary | ICD-10-CM

## 2014-01-23 DIAGNOSIS — E785 Hyperlipidemia, unspecified: Secondary | ICD-10-CM

## 2014-01-23 MED ORDER — HYDROCODONE-ACETAMINOPHEN 10-325 MG PO TABS: 1.0000 | ORAL_TABLET | Freq: Three times a day (TID) | ORAL | Status: DC | PRN

## 2014-01-23 MED ORDER — ALPRAZOLAM 1 MG PO TABS: ORAL_TABLET | ORAL | Status: DC

## 2014-01-23 NOTE — Assessment & Plan Note (Signed)
Stable on current rx. No changes. 

## 2014-01-23 NOTE — Assessment & Plan Note (Signed)
Quiet. On ASA, beta blocker, statin.

## 2014-01-23 NOTE — Progress Notes (Signed)
Very pleasant 58 yo here for follow up.  Anxiety/insomnia-  Doing well.  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  No CP or SOB.  Only taking Alprazolam 1 mg at bedtime.  Denies any symptoms of depression.     DM- has been diet controlled since he gastric bypass in 2006 (lost 200 lb s/p gastric bypass).    Weight stable.   Lab Results  Component Value Date   HGBA1C 6.0 10/31/2013     Denies any symptoms of hyper or hypo glycemia.    HLD-  On Zocor 20 mg daily.  Never had issues with myalgias.  Well controlled.   Lab Results  Component Value Date   CHOL 140 10/31/2013   HDL 39.80 10/31/2013   LDLCALC 87 10/31/2013   TRIG 68.0 10/31/2013   CHOLHDL 4 10/31/2013   Chronic pain- chronic low back pain.  No indication of abuse, keeps follow up appointments and follows pain contract. Feels pain is controlled on current rx.  Patient Active Problem List   Diagnosis Date Noted  . Other nonspecific abnormal finding of lung field 09/02/2013  . Chronic pain syndrome 09/02/2013  . Alopecia areata 02/26/2013  . VITAMIN D DEFICIENCY 09/03/2009  . CAD, ARTERY BYPASS GRAFT 05/20/2009  . DIABETES MELLITUS, TYPE II 12/11/2006  . HYPERLIPIDEMIA 12/11/2006  . ANEMIA-IRON DEFICIENCY 12/11/2006  . TOBACCO ABUSE 12/11/2006  . HYPERTENSION 12/11/2006  . MYOCARDIAL INFARCTION, HX OF 12/11/2006  . CARDIOMYOPATHY, ISCHEMIC 12/11/2006  . PULMONARY EMBOLISM, HX OF 12/11/2006   Past Medical History  Diagnosis Date  . Diabetes mellitus   . Hyperlipidemia   . Hypertension   . Low back pain   . MI (myocardial infarction)   . Arthritis   . Tobacco abuse   . Left ventricular dysfunction   . Obesity   . CAD (coronary artery disease)   . PE (pulmonary embolism)   . Anemia    Past Surgical History  Procedure Laterality Date  . Coronary stent placement      3 all in right side  . Gastric bypass    . Coronary artery bypass graft    . Cholecystectomy    . Hernia repair     History  Substance  Use Topics  . Smoking status: Current Every Day Smoker -- 1.00 packs/day for 40 years    Types: Cigarettes  . Smokeless tobacco: Former Systems developer  . Alcohol Use: No   Family History  Problem Relation Age of Onset  . Emphysema Mother   . Heart attack Father   . Heart disease Daughter   . Heart disease Brother   . Arthritis Brother   . Hemophilia Brother    Allergies  Allergen Reactions  . Codeine     REACTION: makes me "looney"   Current Outpatient Prescriptions on File Prior to Visit  Medication Sig Dispense Refill  . ALPRAZolam (XANAX) 1 MG tablet TAKE ONE TABLET BY MOUTH THREE TIMES DAILY AS NEEDED FOR ANXIETY  90 tablet  0  . aspirin 81 MG tablet Take 81 mg by mouth daily.        . Calcium Citrate-Vitamin D 500-400 MG-UNIT CHEW Chew 1 tablet by mouth 2 (two) times daily.        . Cholecalciferol (VITAMIN D) 2000 UNITS tablet Take 5,000 Units by mouth daily.       Marland Kitchen docusate sodium (COLACE) 100 MG capsule Take 100 mg by mouth as needed.        . fexofenadine (ALLEGRA) 180  MG tablet Take 180 mg by mouth daily as needed.      Marland Kitchen HYDROcodone-acetaminophen (NORCO) 10-325 MG per tablet Take 1 tablet by mouth every 8 (eight) hours as needed.  150 tablet  0  . metoprolol succinate (TOPROL-XL) 25 MG 24 hr tablet TAKE 1 TABLET BY MOUTH TWICE DAILY  60 tablet  5  . Multiple Vitamin (MULTIVITAMIN) tablet Take 1 tablet by mouth daily.        . nitroGLYCERIN (NITROSTAT) 0.3 MG SL tablet Place 1 tablet (0.3 mg total) under the tongue every 5 (five) minutes as needed.  90 tablet  3  . simvastatin (ZOCOR) 40 MG tablet Take 20 mg by mouth at bedtime.       No current facility-administered medications on file prior to visit.   The PMH, PSH, Social History, Family History, Medications, and allergies have been reviewed in [facility], and have been updated if relevant.   Review of Systems       See HPI   Physical Exam BP 132/72  Pulse 45  Temp(Src) 97.5 F (36.4 C) (Oral)  Wt 193 lb 8 oz (87.771 kg)   SpO2 97% Wt Readings from Last 3 Encounters:  01/23/14 193 lb 8 oz (87.771 kg)  11/06/13 199 lb 12 oz (90.606 kg)  09/02/13 201 lb 8 oz (91.4 kg)    General: pleasant male in NAD Eyes:  PERRL Ears:  External ear exam shows no significant lesions or deformities.  Otoscopic examination reveals clear canals, tympanic membranes are intact bilaterally without bulging, retraction, inflammation or discharge. Hearing is grossly normal bilaterally. Nose:  External nasal examination shows no deformity or inflammation. Nasal mucosa are pink and moist without lesions or exudates. Neck:  no carotid bruit or thyromegaly no cervical or supraclavicular lymphadenopathy  Lungs:  Normal respiratory effort, chest expands symmetrically. Lungs are clear to auscultation, no crackles or wheezes. Heart:  Normal rate and regular rhythm. S1 and S2 normal without gallop, murmur, click, rub or other extra sounds. Abdomen:  Bowel sounds positive,abdomen soft and non-tender without masses, organomegaly or hernias noted. Pulses:  R and L posterior tibial pulses are full and equal bilaterally  Extremities:  no edema

## 2014-01-23 NOTE — Assessment & Plan Note (Signed)
Stable on current rx. Rx refilled today.

## 2014-01-23 NOTE — Telephone Encounter (Signed)
Relevant patient education assigned to patient using Emmi. ° °

## 2014-01-23 NOTE — Assessment & Plan Note (Signed)
Diet controlled. Urine micro, eye exam and foot exam UTD.

## 2014-01-23 NOTE — Progress Notes (Signed)
Pre visit review using our clinic review tool, if applicable. No additional management support is needed unless otherwise documented below in the visit note. 

## 2014-01-23 NOTE — Patient Instructions (Signed)
Great to see you. Have a good summer.

## 2014-01-24 ENCOUNTER — Telehealth: Payer: Self-pay | Admitting: Family Medicine

## 2014-01-24 NOTE — Telephone Encounter (Signed)
Relevant patient education assigned to patient using Emmi. ° °

## 2014-02-26 ENCOUNTER — Other Ambulatory Visit: Payer: Self-pay

## 2014-02-26 ENCOUNTER — Telehealth: Payer: Self-pay | Admitting: Family Medicine

## 2014-02-26 NOTE — Telephone Encounter (Signed)
Pt returned your phone call.  Call back # [phone]

## 2014-02-26 NOTE — Telephone Encounter (Signed)
Pt left v/m requesting rx hydrocodone apap. Call when ready for pick up.  

## 2014-02-26 NOTE — Telephone Encounter (Signed)
Lm with pts wife requesting a call back

## 2014-02-26 NOTE — Telephone Encounter (Signed)
Vicky w/[employer] is nurse case mgr calling in ref to medication non compliance for the patient.  She says it appears he has not refilled his cholesterol meds in a couple of months and she wanted you to address this w/him at his next visit. Thank you.

## 2014-02-26 NOTE — Telephone Encounter (Signed)
Spoke to pt who states that he is taking meds as directed. He states that the Rx was written for 40mg , but he only takes 20mg , which allows meds to last longer.

## 2014-02-26 NOTE — Telephone Encounter (Signed)
Noted.  PLease call pt to inquire about this.

## 2014-02-26 NOTE — Telephone Encounter (Signed)
Ok thank you 

## 2014-02-27 MED ORDER — HYDROCODONE-ACETAMINOPHEN 10-325 MG PO TABS: 1.0000 | ORAL_TABLET | Freq: Three times a day (TID) | ORAL | Status: DC | PRN

## 2014-02-27 NOTE — Telephone Encounter (Signed)
Spoke to pt and informed him Rx is available for pickup at the front desk

## 2014-03-10 ENCOUNTER — Other Ambulatory Visit: Payer: Self-pay

## 2014-03-10 MED ORDER — ALPRAZOLAM 1 MG PO TABS: ORAL_TABLET | ORAL | Status: DC

## 2014-03-10 NOTE — Telephone Encounter (Signed)
Rx called in to requested pharmacy 

## 2014-03-10 NOTE — Telephone Encounter (Signed)
[employer] left v/m requesting refill alprazolam. Please advise.

## 2014-03-25 ENCOUNTER — Ambulatory Visit (INDEPENDENT_AMBULATORY_CARE_PROVIDER_SITE_OTHER): Payer: Self-pay | Admitting: Family Medicine

## 2014-03-25 ENCOUNTER — Ambulatory Visit (INDEPENDENT_AMBULATORY_CARE_PROVIDER_SITE_OTHER)
Admission: RE | Admit: 2014-03-25 | Discharge: 2014-03-25 | Disposition: A | Discharge status: Home / Self Care | Payer: Medicare HMO | Source: Ambulatory Visit | Attending: Family Medicine | Admitting: Family Medicine

## 2014-03-25 ENCOUNTER — Encounter: Payer: Self-pay | Admitting: Family Medicine

## 2014-03-25 ENCOUNTER — Ambulatory Visit: Payer: Medicare HMO | Admitting: Internal Medicine

## 2014-03-25 DIAGNOSIS — M25569 Pain in unspecified knee: Secondary | ICD-10-CM

## 2014-03-25 DIAGNOSIS — M25562 Pain in left knee: Secondary | ICD-10-CM

## 2014-03-25 DIAGNOSIS — M542 Cervicalgia: Secondary | ICD-10-CM
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
## 2014-03-25 NOTE — Patient Instructions (Signed)
Good to see you. I will call you with your xray results.   
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
## 2014-03-25 NOTE — Progress Notes (Signed)
Pre visit review using our clinic review tool, if applicable. No additional management support is needed unless otherwise documented below in the visit note. 

## 2014-03-26 ENCOUNTER — Telehealth: Payer: Self-pay | Admitting: Family Medicine
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
## 2014-03-28 NOTE — Telephone Encounter (Signed)
Please keep appt for MRI this weekend.
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
## 2014-03-28 NOTE — Telephone Encounter (Signed)
Please call to check on pt. 

## 2014-03-28 NOTE — Telephone Encounter (Signed)
Spoke to pt and advised per Dr Aron; pt verbally expressed understanding.  

## 2014-03-30 ENCOUNTER — Ambulatory Visit
Admission: RE | Admit: 2014-03-30 | Discharge: 2014-03-30 | Disposition: A | Discharge status: Home / Self Care | Payer: Medicare HMO | Source: Ambulatory Visit | Attending: Family Medicine | Admitting: Family Medicine

## 2014-03-30 DIAGNOSIS — M542 Cervicalgia: Secondary | ICD-10-CM

## 2014-04-01 ENCOUNTER — Other Ambulatory Visit: Payer: Self-pay | Admitting: *Deleted

## 2014-04-01 MED ORDER — ALPRAZOLAM 1 MG PO TABS: ORAL_TABLET | ORAL | Status: DC

## 2014-04-01 MED ORDER — HYDROCODONE-ACETAMINOPHEN 10-325 MG PO TABS: 1.0000 | ORAL_TABLET | Freq: Three times a day (TID) | ORAL | Status: DC | PRN
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
## 2014-04-02 ENCOUNTER — Telehealth: Payer: Self-pay | Admitting: Family Medicine

## 2014-04-02 ENCOUNTER — Ambulatory Visit: Payer: Medicare HMO | Admitting: Family Medicine

## 2014-04-02 ENCOUNTER — Encounter: Payer: Self-pay | Admitting: Family Medicine

## 2014-04-02 DIAGNOSIS — M5010 Cervical disc disorder with radiculopathy, unspecified cervical region: Secondary | ICD-10-CM

## 2014-04-02 MED ORDER — ALPRAZOLAM 1 MG PO TABS: ORAL_TABLET | ORAL | Status: DC

## 2014-04-02 MED ORDER — HYDROCODONE-ACETAMINOPHEN 10-325 MG PO TABS: 1.0000 | ORAL_TABLET | Freq: Three times a day (TID) | ORAL | Status: DC | PRN

## 2014-04-02 NOTE — Telephone Encounter (Signed)
Pt came in office today to pick up rx stating that his mychart told him he needs a referral to see an ortho Dr. Please advise.

## 2014-04-02 NOTE — Telephone Encounter (Signed)
Spoke to pt and informed him Rx is available for pickup at the front desk

## 2014-04-02 NOTE — Addendum Note (Signed)
Addended by: Modena Nunnery on: 04/02/2014 09:09 AM   Modules accepted: Orders

## 2014-04-02 NOTE — Telephone Encounter (Signed)
Referral placed.

## 2014-04-03 NOTE — Telephone Encounter (Signed)
Pt called requesting call back regarding ortho referral.

## 2014-04-12 ENCOUNTER — Other Ambulatory Visit: Payer: Self-pay | Admitting: Family Medicine

## 2014-04-22 ENCOUNTER — Encounter: Payer: Self-pay | Admitting: Family Medicine

## 2014-05-05 ENCOUNTER — Other Ambulatory Visit: Payer: Self-pay | Admitting: *Deleted

## 2014-05-05 MED ORDER — HYDROCODONE-ACETAMINOPHEN 10-325 MG PO TABS: 1.0000 | ORAL_TABLET | Freq: Three times a day (TID) | ORAL | Status: DC | PRN

## 2014-05-05 NOTE — Telephone Encounter (Signed)
Pt calls requesting hydrocodone refill. He is aware that Dr Deborra Medina is out of the office today, and that he will not receive a call to p/u rx until tomorrow.

## 2014-05-06 ENCOUNTER — Telehealth: Payer: Self-pay | Admitting: Family Medicine

## 2014-05-06 ENCOUNTER — Encounter: Payer: Self-pay | Admitting: Family Medicine

## 2014-05-06 MED ORDER — HYDROCODONE-ACETAMINOPHEN 10-325 MG PO TABS: 1.0000 | ORAL_TABLET | Freq: Three times a day (TID) | ORAL | Status: DC | PRN

## 2014-05-06 NOTE — Telephone Encounter (Signed)
Original Rx did not print. Lm on pts vm and informed him Rx will be available for pickup after 1400.

## 2014-05-06 NOTE — Addendum Note (Signed)
Addended by: Modena Nunnery on: 05/06/2014 10:56 AM   Modules accepted: Orders

## 2014-05-06 NOTE — Telephone Encounter (Signed)
Patient is calling to find out if his prescription is ready.  Patient is going to physical therapy today [place] and would like to pick the prescription up after physical therapy.  Physical therapy is at 1:00.  If patient's not at home, please call him on his cell phone [phone].  If patient doesn't answer cell phone, please leave a message.

## 2014-05-06 NOTE — Telephone Encounter (Signed)
Waynetta, please see below.

## 2014-05-07 NOTE — Telephone Encounter (Signed)
Pt picked up rx, and gave UDS specimen on 05/06/14.

## 2014-05-15 ENCOUNTER — Other Ambulatory Visit: Payer: Self-pay | Admitting: *Deleted

## 2014-05-15 MED ORDER — ALPRAZOLAM 1 MG PO TABS: ORAL_TABLET | ORAL | Status: DC

## 2014-05-15 NOTE — Telephone Encounter (Signed)
Pt requesting medication refill. Last f/u appt 01/2014. Ok to fill per Dr Deborra Medina. Rx to be faxed to requested pharmacy by end of day, today.

## 2014-05-16 ENCOUNTER — Encounter: Payer: Self-pay | Admitting: Family Medicine

## 2014-06-05 ENCOUNTER — Telehealth: Payer: Self-pay

## 2014-06-05 ENCOUNTER — Ambulatory Visit (INDEPENDENT_AMBULATORY_CARE_PROVIDER_SITE_OTHER): Payer: Medicare HMO | Admitting: Family Medicine

## 2014-06-05 ENCOUNTER — Encounter: Payer: Self-pay | Admitting: Family Medicine

## 2014-06-05 VITALS — BP 138/68 | HR 49 | Temp 98.0°F | Wt 188.0 lb

## 2014-06-05 DIAGNOSIS — I10 Essential (primary) hypertension: Secondary | ICD-10-CM

## 2014-06-05 DIAGNOSIS — Z23 Encounter for immunization: Secondary | ICD-10-CM

## 2014-06-05 DIAGNOSIS — G894 Chronic pain syndrome: Secondary | ICD-10-CM

## 2014-06-05 DIAGNOSIS — E785 Hyperlipidemia, unspecified: Secondary | ICD-10-CM

## 2014-06-05 DIAGNOSIS — E119 Type 2 diabetes mellitus without complications: Secondary | ICD-10-CM

## 2014-06-05 MED ORDER — ALPRAZOLAM 1 MG PO TABS: ORAL_TABLET | ORAL | Status: DC

## 2014-06-05 MED ORDER — HYDROCODONE-ACETAMINOPHEN 10-325 MG PO TABS: 1.0000 | ORAL_TABLET | Freq: Three times a day (TID) | ORAL | Status: DC | PRN

## 2014-06-05 NOTE — Assessment & Plan Note (Signed)
Diet controlled. No changes made today. 

## 2014-06-05 NOTE — Telephone Encounter (Signed)
[name] with [employer] request medical records; [name] will fax request of what records are needed to [phone].

## 2014-06-05 NOTE — Assessment & Plan Note (Signed)
Good control

## 2014-06-05 NOTE — Assessment & Plan Note (Signed)
Has not abused pain contract. Will NOT check another UDS until next year- he has had multiple done in past 6 months.
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
## 2014-06-05 NOTE — Progress Notes (Signed)
Pre visit review using our clinic review tool, if applicable. No additional management support is needed unless otherwise documented below in the visit note. 

## 2014-06-05 NOTE — Assessment & Plan Note (Signed)
Well controlled.  NO changes made today.

## 2014-06-13 ENCOUNTER — Other Ambulatory Visit: Payer: Self-pay | Admitting: Family Medicine

## 2014-07-07 ENCOUNTER — Telehealth: Payer: Self-pay | Admitting: Family Medicine

## 2014-07-07 MED ORDER — HYDROCODONE-ACETAMINOPHEN 10-325 MG PO TABS: 1.0000 | ORAL_TABLET | Freq: Three times a day (TID) | ORAL | Status: DC | PRN

## 2014-07-07 MED ORDER — ALPRAZOLAM 1 MG PO TABS: ORAL_TABLET | ORAL | Status: DC

## 2014-07-07 NOTE — Telephone Encounter (Signed)
Spoke to [place] and informed her Rx will be available for pickup after 1400

## 2014-07-07 NOTE — Telephone Encounter (Signed)
Pt called requesting refill on   HYDROcodone-acetaminophen (NORCO) 10-325 MG per tablet [[id number]]     Order Details     ALPRAZolam (XANAX) 1 MG tablet [[id number]]     Order Details    Please call when ready for pickup [phone]

## 2014-07-07 NOTE — Telephone Encounter (Signed)
Ok to print out if he is due.

## 2014-08-05 ENCOUNTER — Other Ambulatory Visit: Payer: Self-pay

## 2014-08-05 MED ORDER — HYDROCODONE-ACETAMINOPHEN 10-325 MG PO TABS: 1.0000 | ORAL_TABLET | Freq: Three times a day (TID) | ORAL | Status: DC | PRN

## 2014-08-05 NOTE — Telephone Encounter (Signed)
Pt left v/m requesting rx hydrocodone apap. Call when ready for pick up. Pt last seen 06/05/14.

## 2014-08-06 NOTE — Telephone Encounter (Signed)
Spoke to pt and informed him Rx will be available for pickup after 1400

## 2014-08-12 ENCOUNTER — Other Ambulatory Visit: Payer: Self-pay | Admitting: Family Medicine

## 2014-08-12 ENCOUNTER — Other Ambulatory Visit: Payer: Self-pay | Admitting: *Deleted

## 2014-08-12 NOTE — Telephone Encounter (Signed)
Pt requesting medication refill. Last f/u appt 05/2014. pls advise

## 2014-08-13 MED ORDER — ALPRAZOLAM 1 MG PO TABS: ORAL_TABLET | ORAL | Status: DC

## 2014-08-14 NOTE — Telephone Encounter (Signed)
Rx called in to requested pharmacy 

## 2014-09-04 ENCOUNTER — Other Ambulatory Visit: Payer: Self-pay | Admitting: Internal Medicine

## 2014-09-04 ENCOUNTER — Other Ambulatory Visit: Payer: Self-pay | Admitting: Family Medicine

## 2014-09-04 MED ORDER — HYDROCODONE-ACETAMINOPHEN 10-325 MG PO TABS: 1.0000 | ORAL_TABLET | Freq: Three times a day (TID) | ORAL | Status: DC | PRN

## 2014-09-04 NOTE — Telephone Encounter (Signed)
Pt left message at Triage. Dr. Deborra Medina out of office and pt is requesting Rx, please call pt once Rx ready for pick-up

## 2014-09-04 NOTE — Telephone Encounter (Signed)
RX printed and signed, given to [place]. UDS reviewed- low risk.

## 2014-09-04 NOTE — Telephone Encounter (Signed)
Spoke to pt and informed him Rx is available for pickup from the front desk 

## 2014-09-10 ENCOUNTER — Encounter: Payer: Self-pay | Admitting: Family Medicine

## 2014-09-10 ENCOUNTER — Ambulatory Visit (INDEPENDENT_AMBULATORY_CARE_PROVIDER_SITE_OTHER): Payer: Medicare HMO | Admitting: Family Medicine

## 2014-09-10 VITALS — BP 124/62 | HR 55 | Temp 97.9°F | Wt 192.8 lb

## 2014-09-10 DIAGNOSIS — G894 Chronic pain syndrome: Secondary | ICD-10-CM

## 2014-09-10 MED ORDER — HYDROCODONE-ACETAMINOPHEN 10-325 MG PO TABS: 1.0000 | ORAL_TABLET | ORAL | Status: DC | PRN
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
## 2014-09-10 NOTE — Progress Notes (Signed)
Pre visit review using our clinic review tool, if applicable. No additional management support is needed unless otherwise documented below in the visit note. 

## 2014-09-10 NOTE — Assessment & Plan Note (Signed)
Rx refilled today #150 as we had been doing given how he has been taking it for long period of time. No red flag signs- has not been [employer].

## 2014-09-22 ENCOUNTER — Other Ambulatory Visit: Payer: Self-pay | Admitting: *Deleted

## 2014-09-22 MED ORDER — ALPRAZOLAM 1 MG PO TABS: ORAL_TABLET | ORAL | Status: DC

## 2014-09-22 NOTE — Telephone Encounter (Signed)
Last f/u appt 08/2014 

## 2014-09-22 NOTE — Telephone Encounter (Signed)
Rx called in to requested pharmacy 

## 2014-09-22 NOTE — Telephone Encounter (Signed)
Pt left v/m requesting status of alprazolam refill; advised pt med called in today at 2:15. Pt will ck with pharmacy.

## 2014-10-09 ENCOUNTER — Other Ambulatory Visit: Payer: Self-pay

## 2014-10-09 MED ORDER — HYDROCODONE-ACETAMINOPHEN 10-325 MG PO TABS: 1.0000 | ORAL_TABLET | ORAL | Status: DC | PRN

## 2014-10-09 NOTE — Telephone Encounter (Signed)
Pt left v/m requesting rx hydrocodone apap. Call when ready for pick up. Pt last seen 09/10/14.Please advise.

## 2014-10-09 NOTE — Telephone Encounter (Signed)
Called and notified patient rx is ready for pick up, in front office.

## 2014-10-22 ENCOUNTER — Other Ambulatory Visit: Payer: Self-pay | Admitting: *Deleted

## 2014-10-22 NOTE — Telephone Encounter (Signed)
Last f/u appt 08/2014 

## 2014-10-23 MED ORDER — ALPRAZOLAM 1 MG PO TABS: ORAL_TABLET | ORAL | Status: DC

## 2014-10-23 NOTE — Telephone Encounter (Signed)
Rx called in to requested pharmacy 

## 2014-11-11 ENCOUNTER — Ambulatory Visit (INDEPENDENT_AMBULATORY_CARE_PROVIDER_SITE_OTHER): Payer: Medicare HMO | Admitting: Family Medicine

## 2014-11-11 ENCOUNTER — Encounter: Payer: Self-pay | Admitting: Family Medicine

## 2014-11-11 VITALS — BP 122/68 | HR 55 | Temp 98.1°F | Ht 70.25 in | Wt 185.2 lb

## 2014-11-11 DIAGNOSIS — Z79899 Other long term (current) drug therapy: Secondary | ICD-10-CM | POA: Diagnosis not present

## 2014-11-11 DIAGNOSIS — E785 Hyperlipidemia, unspecified: Secondary | ICD-10-CM | POA: Diagnosis not present

## 2014-11-11 DIAGNOSIS — Z0000 Encounter for general adult medical examination without abnormal findings: Secondary | ICD-10-CM | POA: Diagnosis not present

## 2014-11-11 DIAGNOSIS — Z125 Encounter for screening for malignant neoplasm of prostate: Secondary | ICD-10-CM

## 2014-11-11 DIAGNOSIS — R590 Localized enlarged lymph nodes: Secondary | ICD-10-CM

## 2014-11-11 DIAGNOSIS — R5383 Other fatigue: Secondary | ICD-10-CM | POA: Diagnosis not present

## 2014-11-11 DIAGNOSIS — E559 Vitamin D deficiency, unspecified: Secondary | ICD-10-CM | POA: Diagnosis not present

## 2014-11-11 DIAGNOSIS — I25810 Atherosclerosis of coronary artery bypass graft(s) without angina pectoris: Secondary | ICD-10-CM | POA: Diagnosis not present

## 2014-11-11 DIAGNOSIS — G894 Chronic pain syndrome: Secondary | ICD-10-CM

## 2014-11-11 DIAGNOSIS — E119 Type 2 diabetes mellitus without complications: Secondary | ICD-10-CM

## 2014-11-11 DIAGNOSIS — Z1211 Encounter for screening for malignant neoplasm of colon: Secondary | ICD-10-CM

## 2014-11-11 LAB — CBC WITH DIFFERENTIAL/PLATELET
Basophils Absolute: 0 10*3/uL (ref 0.0–0.1)
Basophils Relative: 0.5 % (ref 0.0–3.0)
EOS PCT: 2.7 % (ref 0.0–5.0)
Eosinophils Absolute: 0.2 10*3/uL (ref 0.0–0.7)
HCT: 40.9 % (ref 39.0–52.0)
HEMOGLOBIN: 13.6 g/dL (ref 13.0–17.0)
LYMPHS ABS: 2.3 10*3/uL (ref 0.7–4.0)
Lymphocytes Relative: 36.3 % (ref 12.0–46.0)
MCHC: 33.2 g/dL (ref 30.0–36.0)
MCV: 91.7 fl (ref 78.0–100.0)
MONO ABS: 0.5 10*3/uL (ref 0.1–1.0)
Monocytes Relative: 8.3 % (ref 3.0–12.0)
Neutro Abs: 3.3 10*3/uL (ref 1.4–7.7)
Neutrophils Relative %: 52.2 % (ref 43.0–77.0)
PLATELETS: 167 10*3/uL (ref 150.0–400.0)
RBC: 4.46 Mil/uL (ref 4.22–5.81)
RDW: 14.6 % (ref 11.5–15.5)
WBC: 6.3 10*3/uL (ref 4.0–10.5)

## 2014-11-11 LAB — COMPREHENSIVE METABOLIC PANEL
ALT: 11 U/L (ref 0–53)
AST: 20 U/L (ref 0–37)
Albumin: 3.9 g/dL (ref 3.5–5.2)
Alkaline Phosphatase: 52 U/L (ref 39–117)
BUN: 19 mg/dL (ref 6–23)
CO2: 30 meq/L (ref 19–32)
CREATININE: 1.28 mg/dL (ref 0.40–1.50)
Calcium: 9.9 mg/dL (ref 8.4–10.5)
Chloride: 105 mEq/L (ref 96–112)
GFR: 61.48 mL/min (ref >60.00)
Glucose, Bld: 107 mg/dL — ABNORMAL HIGH (ref 70–99)
Potassium: 5.1 mEq/L (ref 3.5–5.1)
SODIUM: 138 meq/L (ref 135–145)
TOTAL PROTEIN: 7.1 g/dL (ref 6.0–8.3)
Total Bilirubin: 0.4 mg/dL (ref 0.2–1.2)

## 2014-11-11 LAB — LIPID PANEL
Cholesterol: 123 mg/dL (ref 0–200)
HDL: 41.2 mg/dL (ref >39.00)
LDL Cholesterol: 69 mg/dL (ref 0–99)
NONHDL: 81.8
Total CHOL/HDL Ratio: 3
Triglycerides: 64 mg/dL (ref 0.0–149.0)
VLDL: 12.8 mg/dL (ref 0.0–40.0)

## 2014-11-11 LAB — VITAMIN B12: VITAMIN B 12: 186 pg/mL — AB (ref 211–911)

## 2014-11-11 LAB — PSA: PSA: 0.59 ng/mL (ref 0.10–4.00)

## 2014-11-11 MED ORDER — ALPRAZOLAM 1 MG PO TABS: ORAL_TABLET | ORAL | Status: DC

## 2014-11-11 MED ORDER — HYDROCODONE-ACETAMINOPHEN 10-325 MG PO TABS: 1.0000 | ORAL_TABLET | ORAL | Status: DC | PRN

## 2014-11-11 NOTE — Assessment & Plan Note (Signed)
New- with recent unintended weight loss. Discussed this with [name] today. Since he has been asymptomatic, he does not want to work this up yet. With unintentional weight loss, bilateral adenopathy and h/o tobacco abuse, I urged him to proceed with cervical CT today for further assessment.  He declined and wants to follow up in 3 months despite my recommendations. CBC today.

## 2014-11-11 NOTE — Assessment & Plan Note (Signed)
Continue current dose of zocor Check lipid panel.
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
## 2014-11-11 NOTE — Patient Instructions (Signed)
Good to see you. We will call you with your lab results and you can view them online. 

## 2014-11-11 NOTE — Assessment & Plan Note (Signed)
The patients weight, height, BMI and visual acuity have been recorded in the chart I have made referrals, counseling and provided education to the patient based review of the above and I have provided the pt with a written personalized care plan for preventive services.  IFOB

## 2014-11-11 NOTE — Progress Notes (Signed)
Pre visit review using our clinic review tool, if applicable. No additional management support is needed unless otherwise documented below in the visit note. 

## 2014-11-11 NOTE — Assessment & Plan Note (Signed)
Has been diet controlled. q 3 month a1cs not necessary.

## 2014-11-11 NOTE — Assessment & Plan Note (Signed)
Rx refilled today. Has been compliant with controlled substances contract.

## 2014-11-27 ENCOUNTER — Other Ambulatory Visit (INDEPENDENT_AMBULATORY_CARE_PROVIDER_SITE_OTHER): Payer: Medicare HMO

## 2014-11-27 DIAGNOSIS — Z1211 Encounter for screening for malignant neoplasm of colon: Secondary | ICD-10-CM

## 2014-11-27 LAB — FECAL OCCULT BLOOD, IMMUNOCHEMICAL: Fecal Occult Bld: NEGATIVE

## 2014-11-28 ENCOUNTER — Encounter: Payer: Self-pay | Admitting: *Deleted

## 2014-12-11 ENCOUNTER — Other Ambulatory Visit: Payer: Self-pay | Admitting: *Deleted

## 2014-12-11 MED ORDER — HYDROCODONE-ACETAMINOPHEN 10-325 MG PO TABS: 1.0000 | ORAL_TABLET | ORAL | Status: DC | PRN

## 2014-12-11 NOTE — Telephone Encounter (Signed)
CPE 11/11/14, #150 filled at that time.

## 2014-12-11 NOTE — Telephone Encounter (Signed)
Spoke to pt and informed him Rx is available for pickup from the front desk. Pt advised third party unable to pickup .  

## 2014-12-24 ENCOUNTER — Encounter (HOSPITAL_COMMUNITY): Payer: Self-pay | Admitting: Emergency Medicine

## 2014-12-24 ENCOUNTER — Emergency Department (HOSPITAL_COMMUNITY)
Admission: EM | Admit: 2014-12-24 | Discharge: 2014-12-24 | Disposition: A | Discharge status: Home / Self Care | Payer: Medicare HMO | Attending: Emergency Medicine | Admitting: Emergency Medicine

## 2014-12-24 ENCOUNTER — Telehealth: Payer: Self-pay | Admitting: Family Medicine

## 2014-12-24 ENCOUNTER — Emergency Department (HOSPITAL_COMMUNITY): Payer: Medicare HMO

## 2014-12-24 DIAGNOSIS — E119 Type 2 diabetes mellitus without complications: Secondary | ICD-10-CM | POA: Diagnosis not present

## 2014-12-24 DIAGNOSIS — M1990 Unspecified osteoarthritis, unspecified site: Secondary | ICD-10-CM | POA: Insufficient documentation

## 2014-12-24 DIAGNOSIS — Z862 Personal history of diseases of the blood and blood-forming organs and certain disorders involving the immune mechanism: Secondary | ICD-10-CM | POA: Insufficient documentation

## 2014-12-24 DIAGNOSIS — I10 Essential (primary) hypertension: Secondary | ICD-10-CM | POA: Diagnosis not present

## 2014-12-24 DIAGNOSIS — Z720 Tobacco use: Secondary | ICD-10-CM | POA: Diagnosis not present

## 2014-12-24 DIAGNOSIS — Z9861 Coronary angioplasty status: Secondary | ICD-10-CM | POA: Diagnosis not present

## 2014-12-24 DIAGNOSIS — E669 Obesity, unspecified: Secondary | ICD-10-CM | POA: Diagnosis not present

## 2014-12-24 DIAGNOSIS — I2510 Atherosclerotic heart disease of native coronary artery without angina pectoris: Secondary | ICD-10-CM | POA: Insufficient documentation

## 2014-12-24 DIAGNOSIS — E785 Hyperlipidemia, unspecified: Secondary | ICD-10-CM | POA: Insufficient documentation

## 2014-12-24 DIAGNOSIS — Z86711 Personal history of pulmonary embolism: Secondary | ICD-10-CM | POA: Diagnosis not present

## 2014-12-24 DIAGNOSIS — Z79899 Other long term (current) drug therapy: Secondary | ICD-10-CM | POA: Insufficient documentation

## 2014-12-24 DIAGNOSIS — Z7982 Long term (current) use of aspirin: Secondary | ICD-10-CM | POA: Insufficient documentation

## 2014-12-24 DIAGNOSIS — M549 Dorsalgia, unspecified: Secondary | ICD-10-CM | POA: Insufficient documentation

## 2014-12-24 DIAGNOSIS — Z9049 Acquired absence of other specified parts of digestive tract: Secondary | ICD-10-CM | POA: Diagnosis not present

## 2014-12-24 DIAGNOSIS — K5900 Constipation, unspecified: Secondary | ICD-10-CM | POA: Diagnosis not present

## 2014-12-24 DIAGNOSIS — Z951 Presence of aortocoronary bypass graft: Secondary | ICD-10-CM | POA: Diagnosis not present

## 2014-12-24 DIAGNOSIS — I252 Old myocardial infarction: Secondary | ICD-10-CM | POA: Insufficient documentation

## 2014-12-24 LAB — CBC WITH DIFFERENTIAL/PLATELET
Basophils Absolute: 0.1 10*3/uL (ref 0.0–0.1)
Basophils Relative: 1 % (ref 0–1)
EOS ABS: 0.1 10*3/uL (ref 0.0–0.7)
EOS PCT: 1 % (ref 0–5)
HEMATOCRIT: 37.2 % — AB (ref 39.0–52.0)
Hemoglobin: 12.4 g/dL — ABNORMAL LOW (ref 13.0–17.0)
LYMPHS PCT: 47 % — AB (ref 12–46)
Lymphs Abs: 2.6 10*3/uL (ref 0.7–4.0)
MCH: 30.5 pg (ref 26.0–34.0)
MCHC: 33.3 g/dL (ref 30.0–36.0)
MCV: 91.4 fL (ref 78.0–100.0)
MONO ABS: 0.4 10*3/uL (ref 0.1–1.0)
Monocytes Relative: 8 % (ref 3–12)
Neutro Abs: 2.4 10*3/uL (ref 1.7–7.7)
Neutrophils Relative %: 43 % (ref 43–77)
PLATELETS: 154 10*3/uL (ref 150–400)
RBC: 4.07 MIL/uL — ABNORMAL LOW (ref 4.22–5.81)
RDW: 14.2 % (ref 11.5–15.5)
WBC: 5.6 10*3/uL (ref 4.0–10.5)

## 2014-12-24 LAB — POC OCCULT BLOOD, ED: FECAL OCCULT BLD: NEGATIVE

## 2014-12-24 LAB — BASIC METABOLIC PANEL
ANION GAP: 7 (ref 5–15)
BUN: 29 mg/dL — ABNORMAL HIGH (ref 6–20)
CALCIUM: 9.3 mg/dL (ref 8.9–10.3)
CO2: 27 mmol/L (ref 22–32)
Chloride: 104 mmol/L (ref 101–111)
Creatinine, Ser: 1.3 mg/dL — ABNORMAL HIGH (ref 0.61–1.24)
GFR calc Af Amer: >60 mL/min (ref >60)
GFR, EST NON AFRICAN AMERICAN: 59 mL/min — AB (ref >60)
Glucose, Bld: 92 mg/dL (ref 70–99)
Potassium: 4.5 mmol/L (ref 3.5–5.1)
SODIUM: 138 mmol/L (ref 135–145)

## 2014-12-24 MED ORDER — FLEET ENEMA 7-19 GM/118ML RE ENEM
1.0000 | ENEMA | Freq: Once | RECTAL | Status: AC
  Administered 2014-12-24: 1 via RECTAL
  Filled 2014-12-24: qty 1

## 2014-12-24 MED ORDER — MAGNESIUM CITRATE PO SOLN
0.5000 | Freq: Once | ORAL | Status: AC
  Administered 2014-12-24: 0.5 via ORAL
  Filled 2014-12-24: qty 296

## 2014-12-24 NOTE — ED Provider Notes (Signed)
CSN: [id number]     Arrival date & time 12/24/14  1006 History   First MD Initiated Contact with Patient 12/24/14 1237     Chief Complaint  Patient presents with  . 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 Patient notes gradually worsening pain radiating up his back after eating for the past several days. No nausea, vomiting, or diarrhea. Patient does not have abdominal pain. Patient states he normally takes a stool softener daily but at the onset of his constipation, and run out for several days. Patient has been using MiraLAX, Dulcolax, home enemas without relief. Patient denies other treatments or symptoms. No history of bowel obstruction. The onset of this condition was acute. The course is constant. Aggravating factors: eating. Alleviating factors: none.    The history is provided by the patient and medical records.    Past Medical History  Diagnosis Date  . Diabetes mellitus   . Hyperlipidemia   . Hypertension   . Low back pain   . MI (myocardial infarction)   . Arthritis   . Tobacco abuse   . Left ventricular dysfunction   . Obesity   . CAD (coronary artery disease)   . PE (pulmonary embolism)   . Anemia    Past Surgical History  Procedure Laterality Date  . Coronary stent placement      3 all in right side  . Gastric bypass    . Coronary artery bypass graft    . Cholecystectomy    . Hernia repair     Family History  Problem Relation Age of Onset  . Emphysema Mother   . Heart attack Father   . Heart disease Daughter   . Heart disease Brother   . Arthritis Brother   . Hemophilia Brother    History  Substance Use Topics  . Smoking status: Current Every Day Smoker -- 1.00 packs/day for 40 years    Types: Cigarettes  . Smokeless tobacco: Former Systems developer  .  Alcohol Use: No    Review of Systems  Constitutional: Negative for fever.  HENT: Negative for rhinorrhea and sore throat.   Eyes: Negative for redness.  Respiratory: Negative for cough.   Cardiovascular: Negative for chest pain.  Gastrointestinal: Positive for constipation. Negative for nausea, vomiting, abdominal pain, diarrhea and blood in stool.  Genitourinary: Negative for dysuria.  Musculoskeletal: Positive for back pain. Negative for myalgias.  Skin: Negative for rash.  Neurological: Negative for headaches.    Allergies  Codeine  Home Medications   Prior to Admission medications   Medication Sig Start Date End Date Taking? Authorizing Provider  ALPRAZolam Duanne Moron) 1 MG tablet TAKE ONE TABLET BY MOUTH THREE TIMES DAILY AS NEEDED FOR ANXIETY 11/11/14   Lucille Passy, MD  aspirin 81 MG tablet Take 81 mg by mouth daily.      Historical Provider, MD  Calcium Citrate-Vitamin D 500-400 MG-UNIT CHEW Chew 1 tablet by mouth 2 (two) times daily.      Historical Provider, MD  Cholecalciferol (VITAMIN D) 2000 UNITS tablet Take 5,000 Units by mouth daily.     Historical Provider, MD  docusate sodium (COLACE) 100 MG capsule Take 100 mg by mouth as needed.      Historical Provider, MD  fexofenadine (ALLEGRA) 180 MG tablet Take 180 mg by mouth daily as needed. 12/27/11   Lucille Passy, MD  HYDROcodone-acetaminophen (NORCO) 10-325 MG per tablet Take 1 tablet by mouth every 4 (four) hours as needed for severe pain. 12/11/14   Lucille Passy, MD  metoprolol succinate (TOPROL-XL) 25 MG 24 hr tablet TAKE 1 TABLET BY MOUTH TWICE DAILY 06/13/14   Lucille Passy, MD  Multiple Vitamin (MULTIVITAMIN) tablet Take 1 tablet by mouth daily.      Historical Provider, MD  nitroGLYCERIN (NITROSTAT) 0.3 MG SL tablet Place 1 tablet (0.3 mg total) under the tongue every 5 (five) minutes as needed. 10/10/11   Lucille Passy, MD  simvastatin (ZOCOR) 40 MG tablet TAKE 1 TABLET BY MOUTH AT BEDTIME FOR CHOLESTEROL 08/12/14   Lucille Passy, MD   BP 131/61 mmHg  Pulse 69  Temp(Src) 98 F (36.7 C)  Resp 18  Ht 6' (1.829 m)  Wt 185 lb (83.915 kg)  BMI 25.08 kg/m2  SpO2 99%   Physical Exam  Constitutional: He appears well-developed and well-nourished.  HENT:  Head: Normocephalic and atraumatic.  Eyes: Conjunctivae are normal. Right eye exhibits no discharge. Left eye exhibits no discharge.  Neck: Normal range of motion. Neck supple.  Cardiovascular: Normal rate, regular rhythm and normal heart sounds.   Pulmonary/Chest: Effort normal and breath sounds normal.  Abdominal: Soft. Bowel sounds are normal. There is no tenderness. There is no rebound and no guarding.  Genitourinary: Rectal exam shows no external hemorrhoid, no internal hemorrhoid, no fissure, no mass, no tenderness and anal tone normal. Guaiac negative stool.  Soft brown stool. No obstruction or impaction.  Neurological: He is alert.  Skin: Skin is warm and dry.  Psychiatric: He has a normal mood and affect.  Nursing note and vitals reviewed.   ED Course  Procedures (including critical care time) Labs Review Labs Reviewed - No data to display  Imaging Review Dg Abd 1 View  12/24/2014   CLINICAL DATA:  59 year old male with previous bowel obstruction a abdominal pain often greater on the left side. Constipation. Current history of gastric bypass 10 years ago. Initial encounter.  EXAM: ABDOMEN - 1 VIEW  COMPARISON:  [facility] Abdominal series 10 09/13/2010. CT Abdomen and Pelvis 05/07/2008.  FINDINGS: 2 supine views of the abdomen. Negative lung bases. Non obstructed bowel gas pattern. Mild to moderate volume of retained stool in the colon, similar to that in 2009. Stable abdominal and pelvic visceral contours. Interval right lower abdominal or inguinal hernia repair. Chronic aortoiliac and pelvic calcified atherosclerosis. Progressed disc and endplate degeneration in the lower lumbar spine. No acute osseous abnormality identified. No  definite pneumoperitoneum on this supine view.  IMPRESSION: Non obstructed bowel gas pattern. Volume of retained stool similar to prior exams.   Electronically Signed   By: Genevie Ann M.D.   On: 12/24/2014 11:13     EKG Interpretation None      12:44 PM Patient not yet in room.    Vital signs reviewed and are as follows: BP 131/61 mmHg  Pulse 69  Temp(Src) 98 F (36.7 C)  Resp 18  Ht 6' (1.829 m)  Wt 185 lb (83.915 kg)  BMI 25.08 kg/m2  SpO2 99%  12:54 PM Pt seen and examined. Hemoccult performed with chaperone [facility] RN) at bedside.   3:06 PM Patient has successfully had BM after magnesium citrate and Fleet enema.  Will discharge to home. Patient encouraged to continue Colace given his chronic use of pain medications. Encouraged to use MiraLAX, hydrate well, and ensure proper fiber in diet. Patient  to follow-up with PCP in the next several days for recheck.  The patient was urged to return to the Emergency Department immediately with worsening of current symptoms, worsening abdominal pain, persistent vomiting, blood noted in stools, fever, or any other concerns. The patient verbalized understanding.    MDM   Final diagnoses:  Constipation, unspecified constipation type   Patient with constipation, no bowel movement in 10 days. Patient has pain with eating only. No abdominal tenderness on palpation. No fever, nausea or vomiting. Patient is passing gas. No evidence of bowel obstruction on plain film x-ray. Patient treated in ED with magnesium citrate and Fleet enema with good results. Abdominal exam remains unchanged. Vital signs are stable. Patient to continue bowel regimen at home. He is to follow-up with PCP if symptoms do not improve. Labs show mild anemia but no blood in stool. Otherwise no concerning findings.  No dangerous or life-threatening conditions suspected or identified by history, physical exam, and by work-up. No indications for hospitalization identified.       Carlisle Cater, PA-C 12/24/14 [place], DO 12/24/14 1510

## 2014-12-24 NOTE — Telephone Encounter (Signed)
Thanks for letting me know.  Please call to check on him tomorrow.

## 2014-12-24 NOTE — Telephone Encounter (Signed)
Patient called to let Dr.Aron know he's going to [facility].  He said he was told to call and let you know which hospital he was going to so you could call ahead.

## 2014-12-24 NOTE — ED Notes (Addendum)
Pt reports no BM for a week; takes narcotics 5 x day. Been taking miralx and enema with homemade system with waterbottle. Not effective. No other complaints. Pain worse after he eats. No vomiting. Reports he "doesnt feel bad".

## 2014-12-24 NOTE — Telephone Encounter (Signed)
[place] [facility] Patient Name: [name] Severe DOB: 07/14/1956 Initial Comment caller states he is a gastric bypass pt - he has not gone to bathroom in 10 days - he is having severe back pain when he eats Nurse Assessment Nurse: Erlene Quan, RN, Manuela Schwartz Date/Time Eilene Ghazi Time): 12/24/2014 8:38:25 AM Confirm and document reason for call. If symptomatic, describe symptoms. ---caller states he is a gastric bypass pt about 10 years ago - he has not gone to bathroom in 10 days - he is having severe back pain when he eats - he has been taking - laxatives Miralax enemas and nothing is helping - he has been taking 2 doses of Mirlax a day for 8 days - he took 4 laxatives last night before bed and just getting cramping and passing gas - states he feels pretty good other than back pain that is radiating to his stomach and kidney area - states the office had appointment but it was for this afternoon Has the patient traveled out of the country within the last 30 days? ---Not Applicable Does the patient require triage? ---Yes Related visit to physician within the last 2 weeks? ---No Does the PT have any chronic conditions? (i.e. diabetes, asthma, etc.) ---Yes List chronic conditions. ---open heart surgery heart attacks x 5 by pass surgery Guidelines Guideline Title Affirmed Question Affirmed Notes Constipation [1] Sudden onset rectal pain (straining, rectal pressure or fullness) AND [2] NOT better after SITZ bath, suppository or enema Final Disposition User See Physician within 4 Hours (or PCP triage) Erlene Quan, RN, [place] caller with being constipated for this long a period he needs to be seen with in next 4 hours and it is very possible x-rays maybe needed before any further treatment can be done - with his history of cardiac disease and gastric by-pass I felt he needed to be seen at ER rather than  wait to be seen at office this after noon - caller agreed states he thinks he will fell better if he can get something done - he is going to call his wife and find out which ER they will be going to - states he will call the office back and let them know what hospital he decides

## 2014-12-24 NOTE — Discharge Instructions (Signed)
Please read and follow all provided instructions.  Your diagnoses today include:  1. Constipation, unspecified constipation type     Tests performed today include:  Blood counts and electrolytes  Blood tests to check liver and kidney function  Stool test for blood - no blood in stool  X-ray of abdomen - shows stool without any obstruction  Vital signs. See below for your results today.   Medications prescribed:   None  Take any prescribed medications only as directed.  Home care instructions:   Follow any educational materials contained in this packet.  Follow-up instructions: Please follow-up with your primary care provider in the next 3 days for further evaluation of your symptoms.    Return instructions:  SEEK IMMEDIATE MEDICAL ATTENTION IF:  The pain does not go away or becomes severe   A temperature above 101F develops   Repeated vomiting occurs (multiple episodes)   The pain becomes localized to portions of the abdomen. The right side could possibly be appendicitis. In an adult, the left lower portion of the abdomen could be colitis or diverticulitis.   Blood is being passed in stools or vomit (bright red or black tarry stools)   You develop chest pain, difficulty breathing, dizziness or fainting, or become confused, poorly responsive, or inconsolable (young children)  If you have any other emergent concerns regarding your health  Additional Information: Abdominal (belly) pain can be caused by many things. Your caregiver performed an examination and possibly ordered blood/urine tests and imaging (CT scan, x-rays, ultrasound). Many cases can be observed and treated at home after initial evaluation in the emergency department. Even though you are being discharged home, abdominal pain can be unpredictable. Therefore, you need a repeated exam if your pain does not resolve, returns, or worsens. Most patients with abdominal pain don't have to be admitted to the  hospital or have surgery, but serious problems like appendicitis and gallbladder attacks can start out as nonspecific pain. Many abdominal conditions cannot be diagnosed in one visit, so follow-up evaluations are very important.  Your vital signs today were: BP 124/68 mmHg   Pulse 47   Temp(Src) 98 F (36.7 C)   Resp 18   Ht 6' (1.829 m)   Wt 185 lb (83.915 kg)   BMI 25.08 kg/m2   SpO2 99% If your blood pressure (bp) was elevated above 135/85 this visit, please have this repeated by your doctor within one month. --------------

## 2014-12-25 NOTE — Telephone Encounter (Signed)
Spoke to [place], who states that pt is feeling "much better and is out and about with his daughter."

## 2014-12-25 NOTE — Telephone Encounter (Signed)
Great! Thanks for the update.

## 2014-12-29 ENCOUNTER — Other Ambulatory Visit: Payer: Self-pay | Admitting: *Deleted

## 2014-12-29 MED ORDER — ALPRAZOLAM 1 MG PO TABS: ORAL_TABLET | ORAL | Status: DC

## 2014-12-29 NOTE — Telephone Encounter (Signed)
Last f/u appt 10/2104-CPE

## 2014-12-29 NOTE — Telephone Encounter (Signed)
Attempted to call in Rx, pharmacy line busy

## 2014-12-30 NOTE — Telephone Encounter (Signed)
Rx called in to requested pharmacy 

## 2015-01-08 ENCOUNTER — Other Ambulatory Visit: Payer: Self-pay | Admitting: Family Medicine

## 2015-01-08 ENCOUNTER — Ambulatory Visit (INDEPENDENT_AMBULATORY_CARE_PROVIDER_SITE_OTHER): Payer: Medicare HMO | Admitting: Family Medicine

## 2015-01-08 ENCOUNTER — Encounter: Payer: Self-pay | Admitting: Family Medicine

## 2015-01-08 VITALS — BP 122/68 | HR 54 | Temp 98.1°F | Wt 184.5 lb

## 2015-01-08 DIAGNOSIS — G894 Chronic pain syndrome: Secondary | ICD-10-CM | POA: Diagnosis not present

## 2015-01-08 DIAGNOSIS — R1013 Epigastric pain: Secondary | ICD-10-CM | POA: Diagnosis not present

## 2015-01-08 DIAGNOSIS — Z630 Problems in relationship with spouse or partner: Secondary | ICD-10-CM

## 2015-01-08 DIAGNOSIS — K5900 Constipation, unspecified: Secondary | ICD-10-CM

## 2015-01-08 LAB — H. PYLORI ANTIBODY, IGG: H PYLORI IGG: POSITIVE — AB

## 2015-01-08 MED ORDER — HYDROCODONE-ACETAMINOPHEN 10-325 MG PO TABS: 1.0000 | ORAL_TABLET | ORAL | Status: DC | PRN

## 2015-01-08 MED ORDER — AMOXICILLIN 500 MG PO CAPS: 1000.0000 mg | ORAL_CAPSULE | Freq: Two times a day (BID) | ORAL | Status: DC

## 2015-01-08 MED ORDER — CLARITHROMYCIN 500 MG PO TABS: 500.0000 mg | ORAL_TABLET | Freq: Two times a day (BID) | ORAL | Status: DC

## 2015-01-08 MED ORDER — OMEPRAZOLE 20 MG PO CPDR: 20.0000 mg | DELAYED_RELEASE_CAPSULE | Freq: Two times a day (BID) | ORAL | Status: DC

## 2015-01-08 NOTE — Progress Notes (Signed)
Subjective:   Patient ID: [name], male    DOB: 1956/08/06, 59 y.o.   MRN: [id number]  HATEM CULL is a pleasant 59 y.o. year old male well known to me with remote h/o gastric bypass and chronic pain with opioid use who presents to clinic today with Hospitalization Follow-up  on 01/08/2015  HPI: [facility] to ED on 12/24/14 for constipation. Note reviewed. Had not had a BM in 10 days and was having worsening back pain.  No abdominal pain, nausea, vomiting or fevers. Was using Miralax, dulcolax and enemas at home without relief.  KUB neg for bowel obstruction.   Dg Abd 1 View  12/24/2014   CLINICAL DATA:  59 year old male with previous bowel obstruction a abdominal pain often greater on the left side. Constipation. Current history of gastric bypass 59 years ago. Initial encounter.  EXAM: ABDOMEN - 1 VIEW  COMPARISON:  [facility] Abdominal series 10 09/13/2010. CT Abdomen and Pelvis 05/07/2008.  FINDINGS: 2 supine views of the abdomen. Negative lung bases. Non obstructed bowel gas pattern. Mild to moderate volume of retained stool in the colon, similar to that in 2009. Stable abdominal and pelvic visceral contours. Interval right lower abdominal or inguinal hernia repair. Chronic aortoiliac and pelvic calcified atherosclerosis. Progressed disc and endplate degeneration in the lower lumbar spine. No acute osseous abnormality identified. No definite pneumoperitoneum on this supine view.  IMPRESSION: Non obstructed bowel gas pattern. Volume of retained stool similar to prior exams.   Electronically Signed   By: Genevie Ann M.D.   On: 12/24/2014 11:13   Lab Results  Component Value Date   WBC 5.6 12/24/2014   HGB 12.4* 12/24/2014   HCT 37.2* 12/24/2014   MCV 91.4 12/24/2014   PLT 154 12/24/2014   Had successful BM in Er after he was given mag citrate and a fleet's enema. Discharged home and encouraged to resume miralax daily.  Bowels have been more regular but now having some  epigastric discomfort ("not really pain") after he eats.  A friend of his told him to take prilosec daily which has helped.   No black or bloody stools.  Still not having any nausea or vomiting. Under increased stress at home- his wife has asked him to leave. He just bought a house.  Feeling better about the situation. Sleeping ok. Appetite ok. Wt Readings from Last 3 Encounters:  01/08/15 184 lb 8 oz (83.689 kg)  12/24/14 185 lb (83.915 kg)  11/11/14 185 lb 4 oz (84.029 kg)    Current Outpatient Prescriptions on File Prior to Visit  Medication Sig Dispense Refill  . ALPRAZolam (XANAX) 1 MG tablet TAKE ONE TABLET BY MOUTH THREE TIMES DAILY AS NEEDED FOR ANXIETY 90 tablet 0  . aspirin 81 MG tablet Take 81 mg by mouth daily.      . Calcium Citrate-Vitamin D 500-400 MG-UNIT CHEW Chew 1 tablet by mouth 2 (two) times daily.      . Cholecalciferol (VITAMIN D) 2000 UNITS tablet Take 5,000 Units by mouth daily.     Marland Kitchen docusate sodium (COLACE) 100 MG capsule Take 100 mg by mouth as needed.      . fexofenadine (ALLEGRA) 180 MG tablet Take 180 mg by mouth daily as needed.    . metoprolol succinate (TOPROL-XL) 25 MG 24 hr tablet TAKE 1 TABLET BY MOUTH TWICE DAILY 60 tablet 4  . Multiple Vitamin (MULTIVITAMIN) tablet Take 1 tablet by mouth daily.      . nitroGLYCERIN (  NITROSTAT) 0.3 MG SL tablet Place 1 tablet (0.3 mg total) under the tongue every 5 (five) minutes as needed. 90 tablet 3  . simvastatin (ZOCOR) 40 MG tablet TAKE 1 TABLET BY MOUTH AT BEDTIME FOR CHOLESTEROL 90 tablet 0   No current facility-administered medications on file prior to visit.    Allergies  Allergen Reactions  . Codeine     REACTION: makes me "looney"    Past Medical History  Diagnosis Date  . Diabetes mellitus   . Hyperlipidemia   . Hypertension   . Low back pain   . MI (myocardial infarction)   . Arthritis   . Tobacco abuse   . Left ventricular dysfunction   . Obesity   . CAD (coronary artery disease)   . PE  (pulmonary embolism)   . Anemia     Past Surgical History  Procedure Laterality Date  . Coronary stent placement      3 all in right side  . Gastric bypass    . Coronary artery bypass graft    . Cholecystectomy    . Hernia repair      Family History  Problem Relation Age of Onset  . Emphysema Mother   . Heart attack Father   . Heart disease Daughter   . Heart disease Brother   . Arthritis Brother   . Hemophilia Brother     History   Social History  . Marital Status: Widowed    Spouse Name: N/A  . Number of Children: N/A  . Years of Education: N/A   Occupational History  . Not on file.   Social History Main Topics  . Smoking status: Current Every Day Smoker -- 1.00 packs/day for 40 years    Types: Cigarettes  . Smokeless tobacco: Former Systems developer  . Alcohol Use: No  . Drug Use: No  . Sexual Activity: Not on file   Other Topics Concern  . Not on file   Social History Narrative   Would desire CPR.   Does not desire long term life support or feeding tubes.         The PMH, PSH, Social History, Family History, Medications, and allergies have been reviewed in [facility], and have been updated if relevant.    Review of Systems  Constitutional: Negative.   HENT: Negative.   Respiratory: Negative.   Cardiovascular: Negative.   Gastrointestinal: Positive for abdominal pain and constipation. Negative for nausea and diarrhea.  Genitourinary: Negative.   Musculoskeletal: Negative.   Skin: Negative.   Neurological: Negative.   Hematological: Negative.   Psychiatric/Behavioral: Negative.   All other systems reviewed and are negative.      Objective:    BP 122/68 mmHg  Pulse 54  Temp(Src) 98.1 F (36.7 C) (Oral)  Wt 184 lb 8 oz (83.689 kg)  SpO2 96%   Physical Exam  Constitutional: He is oriented to person, place, and time. He appears well-developed and well-nourished. No distress.  HENT:  Head: Normocephalic.  Eyes: Conjunctivae are normal.  Cardiovascular:  Normal rate.   Pulmonary/Chest: Effort normal.  Abdominal: Soft. Bowel sounds are normal. He exhibits no distension. There is no tenderness. There is no rebound and no guarding.  Musculoskeletal: He exhibits no edema.  Neurological: He is alert and oriented to person, place, and time. No cranial nerve deficit.  Skin: Skin is warm and dry.  Psychiatric: He has a normal mood and affect. His behavior is normal. Judgment and thought content normal.  Nursing note and vitals  reviewed.         Assessment & Plan:   Chronic pain syndrome  Constipation, unspecified constipation type  Abdominal pain, epigastric - Plan: H. pylori antibody, IgG  Stress due to marital problems No Follow-up on file.

## 2015-01-08 NOTE — Assessment & Plan Note (Signed)
New- agree with prilosec 20 mg daily.  Refer him to GI-for endoscopy, also needs a colonoscopy for screening. He wants me to hold off on referral. H pylori today.

## 2015-01-08 NOTE — Patient Instructions (Signed)
Good to see you. I am so sorry about what you are going through. Continue taking Prilosec 20 mg daily. Call me when you are ready for me to refer you a stomach doctor (GI).

## 2015-01-08 NOTE — Assessment & Plan Note (Signed)
>  45 minutes spent in face to face time with patient, >50% spent in counselling or coordination of care concerning recent separation, abdominal pain, constipation and ER visit. Offered my support concerning his recent separation.  He has good support system at home. Feels he is finally coping ok.

## 2015-01-08 NOTE — Assessment & Plan Note (Signed)
Improved with daily miralax. Call or return to clinic prn if these symptoms worsen or fail to improve as anticipated. The patient indicates understanding of these issues and agrees with the plan.

## 2015-01-09 ENCOUNTER — Encounter: Payer: Self-pay | Admitting: Family Medicine

## 2015-01-28 ENCOUNTER — Other Ambulatory Visit: Payer: Self-pay | Admitting: *Deleted

## 2015-01-28 MED ORDER — ALPRAZOLAM 1 MG PO TABS: ORAL_TABLET | ORAL | Status: DC

## 2015-01-28 NOTE — Telephone Encounter (Signed)
Line busy at pharmacy; will attempt later

## 2015-01-28 NOTE — Telephone Encounter (Signed)
Rx called in to requested pharmacy 

## 2015-01-28 NOTE — Telephone Encounter (Signed)
Last f/u appt 10/2014-CPE 

## 2015-02-02 ENCOUNTER — Telehealth: Payer: Self-pay

## 2015-02-02 NOTE — Telephone Encounter (Signed)
Diabetic Bundle. I contacted the pt's wife and advised pt's A1C blood test is due. Pt is going to contact [facility] to have blood test scheduled.

## 2015-02-09 ENCOUNTER — Ambulatory Visit (INDEPENDENT_AMBULATORY_CARE_PROVIDER_SITE_OTHER): Payer: Medicare HMO | Admitting: Family Medicine

## 2015-02-10 ENCOUNTER — Ambulatory Visit (INDEPENDENT_AMBULATORY_CARE_PROVIDER_SITE_OTHER): Payer: Medicare HMO | Admitting: Family Medicine

## 2015-02-10 ENCOUNTER — Encounter: Payer: Self-pay | Admitting: Family Medicine

## 2015-02-10 VITALS — BP 132/68 | HR 45 | Temp 98.3°F | Wt 180.5 lb

## 2015-02-10 DIAGNOSIS — Z1211 Encounter for screening for malignant neoplasm of colon: Secondary | ICD-10-CM | POA: Diagnosis not present

## 2015-02-10 DIAGNOSIS — R197 Diarrhea, unspecified: Secondary | ICD-10-CM

## 2015-02-10 DIAGNOSIS — E119 Type 2 diabetes mellitus without complications: Secondary | ICD-10-CM | POA: Diagnosis not present

## 2015-02-10 DIAGNOSIS — G894 Chronic pain syndrome: Secondary | ICD-10-CM | POA: Diagnosis not present

## 2015-02-10 DIAGNOSIS — E785 Hyperlipidemia, unspecified: Secondary | ICD-10-CM

## 2015-02-10 LAB — CBC WITH DIFFERENTIAL/PLATELET
BASOS ABS: 0 10*3/uL (ref 0.0–0.1)
BASOS PCT: 0.6 % (ref 0.0–3.0)
EOS ABS: 0.1 10*3/uL (ref 0.0–0.7)
Eosinophils Relative: 2.5 % (ref 0.0–5.0)
HCT: 35.2 % — ABNORMAL LOW (ref 39.0–52.0)
Hemoglobin: 11.7 g/dL — ABNORMAL LOW (ref 13.0–17.0)
Lymphocytes Relative: 35.1 % (ref 12.0–46.0)
Lymphs Abs: 1.8 10*3/uL (ref 0.7–4.0)
MCHC: 33.2 g/dL (ref 30.0–36.0)
MCV: 89.6 fl (ref 78.0–100.0)
MONO ABS: 0.4 10*3/uL (ref 0.1–1.0)
Monocytes Relative: 8.4 % (ref 3.0–12.0)
NEUTROS ABS: 2.7 10*3/uL (ref 1.4–7.7)
Neutrophils Relative %: 53.4 % (ref 43.0–77.0)
Platelets: 183 10*3/uL (ref 150.0–400.0)
RBC: 3.93 Mil/uL — AB (ref 4.22–5.81)
RDW: 15.4 % (ref 11.5–15.5)
WBC: 5.1 10*3/uL (ref 4.0–10.5)

## 2015-02-10 LAB — COMPREHENSIVE METABOLIC PANEL
ALT: 20 U/L (ref 0–53)
AST: 33 U/L (ref 0–37)
Albumin: 4 g/dL (ref 3.5–5.2)
Alkaline Phosphatase: 42 U/L (ref 39–117)
BUN: 18 mg/dL (ref 6–23)
CO2: 27 meq/L (ref 19–32)
CREATININE: 1.29 mg/dL (ref 0.40–1.50)
Calcium: 9.3 mg/dL (ref 8.4–10.5)
Chloride: 107 mEq/L (ref 96–112)
GFR: 60.88 mL/min (ref >60.00)
GLUCOSE: 108 mg/dL — AB (ref 70–99)
POTASSIUM: 4.8 meq/L (ref 3.5–5.1)
Sodium: 139 mEq/L (ref 135–145)
Total Bilirubin: 0.5 mg/dL (ref 0.2–1.2)
Total Protein: 7.2 g/dL (ref 6.0–8.3)

## 2015-02-10 LAB — LDL CHOLESTEROL, DIRECT: Direct LDL: 54 mg/dL

## 2015-02-10 LAB — HEMOGLOBIN A1C: Hgb A1c MFr Bld: 5.9 % (ref 4.6–6.5)

## 2015-02-10 MED ORDER — HYDROCODONE-ACETAMINOPHEN 10-325 MG PO TABS: 1.0000 | ORAL_TABLET | ORAL | Status: DC | PRN

## 2015-02-10 NOTE — Assessment & Plan Note (Signed)
Diet controlled. Due for labs today. Orders Placed This Encounter  Procedures  . Hemoglobin A1c  . Comprehensive metabolic panel  . LDL Cholesterol, Direct  . CBC with Differential/Platelet  . C. difficile GDH and Toxin A/B

## 2015-02-10 NOTE — Progress Notes (Signed)
Pre visit review using our clinic review tool, if applicable. No additional management support is needed unless otherwise documented below in the visit note. 

## 2015-02-10 NOTE — Assessment & Plan Note (Addendum)
New- Continue probiotics for now.  Stool cx to rule out C diff.  He is also under marital stress- ? Component of IBS. Exam reassuring. Call or return to clinic prn if these symptoms worsen or fail to improve as anticipated. The patient indicates understanding of these issues and agrees with the plan.
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
## 2015-02-10 NOTE — Assessment & Plan Note (Signed)
Compliant with pain contract. Rx given to pt today.

## 2015-02-12 NOTE — Addendum Note (Signed)
Addended by: Ellamae Sia on: 02/12/2015 12:36 PM   Modules accepted: Orders

## 2015-02-12 NOTE — Addendum Note (Signed)
Addended by: Jearld Fenton on: 02/12/2015 11:58 AM   Modules accepted: Orders

## 2015-02-13 LAB — C. DIFFICILE GDH AND TOXIN A/B
C. DIFF TOXIN A/B: NOT DETECTED
C. DIFFICILE GDH: NOT DETECTED

## 2015-02-19 ENCOUNTER — Other Ambulatory Visit: Payer: Self-pay | Admitting: Family Medicine

## 2015-03-02 ENCOUNTER — Other Ambulatory Visit: Payer: Self-pay | Admitting: *Deleted

## 2015-03-02 MED ORDER — ALPRAZOLAM 1 MG PO TABS: ORAL_TABLET | ORAL | Status: DC

## 2015-03-02 NOTE — Telephone Encounter (Signed)
Pharmacy line busy.

## 2015-03-02 NOTE — Telephone Encounter (Signed)
Rx called in to requested pharmacy 

## 2015-03-02 NOTE — Telephone Encounter (Signed)
Last f/u appt 01/2015 

## 2015-03-11 ENCOUNTER — Other Ambulatory Visit: Payer: Self-pay

## 2015-03-11 MED ORDER — HYDROCODONE-ACETAMINOPHEN 10-325 MG PO TABS: 1.0000 | ORAL_TABLET | ORAL | Status: DC | PRN

## 2015-03-11 NOTE — Telephone Encounter (Signed)
Spoke to pt and informed him Rx is available for pickup from the front desk 

## 2015-03-11 NOTE — Telephone Encounter (Signed)
Pt left v/m requesting rx hydrocodone apap. Call when ready for pick up. rx last printed # 150 on 02/10/15 and pt last seen 02/10/15.

## 2015-03-31 ENCOUNTER — Other Ambulatory Visit: Payer: Self-pay | Admitting: *Deleted

## 2015-03-31 MED ORDER — ALPRAZOLAM 1 MG PO TABS: ORAL_TABLET | ORAL | Status: DC

## 2015-03-31 NOTE — Telephone Encounter (Signed)
Last f/u appt 01/2015 

## 2015-03-31 NOTE — Telephone Encounter (Signed)
Rx called in to requested pharmacy 

## 2015-04-09 ENCOUNTER — Other Ambulatory Visit: Payer: Self-pay

## 2015-04-09 MED ORDER — HYDROCODONE-ACETAMINOPHEN 10-325 MG PO TABS: 1.0000 | ORAL_TABLET | ORAL | Status: DC | PRN

## 2015-04-09 NOTE — Telephone Encounter (Signed)
Rx printed and given to pt. Pt advised for future reference that Rx have 24-48 hours to be filled and he will need to make request and await a call that Rx is ready.

## 2015-04-09 NOTE — Telephone Encounter (Signed)
Pt at [facility] requesting rx hydrocodone apap. Pt would like to pick up now. Lives near [place] and in town for short period this morning and wanted to pick up before leaving town. rx last printed # 150 on 03/11/15. Last seen 02/10/15. Pt is in  waiting room hoping to get rx while here.Please advise.

## 2015-05-04 ENCOUNTER — Other Ambulatory Visit: Payer: Self-pay | Admitting: *Deleted

## 2015-05-04 MED ORDER — ALPRAZOLAM 1 MG PO TABS: ORAL_TABLET | ORAL | Status: DC

## 2015-05-04 NOTE — Telephone Encounter (Signed)
Last f/u 01/2015 

## 2015-05-04 NOTE — Telephone Encounter (Signed)
Attempted to call pharmacy;line busy

## 2015-05-05 NOTE — Telephone Encounter (Signed)
Rx called in to requested pharmacy 

## 2015-05-11 ENCOUNTER — Ambulatory Visit: Payer: Medicare HMO | Admitting: Family Medicine

## 2015-05-12 ENCOUNTER — Ambulatory Visit (INDEPENDENT_AMBULATORY_CARE_PROVIDER_SITE_OTHER): Payer: Medicare HMO | Admitting: Family Medicine

## 2015-05-12 ENCOUNTER — Encounter: Payer: Self-pay | Admitting: Family Medicine

## 2015-05-12 VITALS — BP 128/62 | HR 74 | Temp 97.8°F | Wt 183.2 lb

## 2015-05-12 DIAGNOSIS — I10 Essential (primary) hypertension: Secondary | ICD-10-CM | POA: Diagnosis not present

## 2015-05-12 DIAGNOSIS — D509 Iron deficiency anemia, unspecified: Secondary | ICD-10-CM

## 2015-05-12 DIAGNOSIS — Z23 Encounter for immunization: Secondary | ICD-10-CM | POA: Diagnosis not present

## 2015-05-12 DIAGNOSIS — E785 Hyperlipidemia, unspecified: Secondary | ICD-10-CM

## 2015-05-12 DIAGNOSIS — E119 Type 2 diabetes mellitus without complications: Secondary | ICD-10-CM

## 2015-05-12 LAB — CBC WITH DIFFERENTIAL/PLATELET
Basophils Absolute: 0 10*3/uL (ref 0.0–0.1)
Basophils Relative: 0.7 % (ref 0.0–3.0)
EOS PCT: 3.4 % (ref 0.0–5.0)
Eosinophils Absolute: 0.2 10*3/uL (ref 0.0–0.7)
HCT: 34.7 % — ABNORMAL LOW (ref 39.0–52.0)
HEMOGLOBIN: 11.4 g/dL — AB (ref 13.0–17.0)
LYMPHS ABS: 2.3 10*3/uL (ref 0.7–4.0)
Lymphocytes Relative: 41.7 % (ref 12.0–46.0)
MCHC: 33 g/dL (ref 30.0–36.0)
MCV: 87.6 fl (ref 78.0–100.0)
MONOS PCT: 9.4 % (ref 3.0–12.0)
Monocytes Absolute: 0.5 10*3/uL (ref 0.1–1.0)
Neutro Abs: 2.5 10*3/uL (ref 1.4–7.7)
Neutrophils Relative %: 44.8 % (ref 43.0–77.0)
Platelets: 189 10*3/uL (ref 150.0–400.0)
RBC: 3.96 Mil/uL — AB (ref 4.22–5.81)
RDW: 17.1 % — AB (ref 11.5–15.5)
WBC: 5.6 10*3/uL (ref 4.0–10.5)

## 2015-05-12 LAB — COMPREHENSIVE METABOLIC PANEL
ALK PHOS: 50 U/L (ref 39–117)
ALT: 33 U/L (ref 0–53)
AST: 45 U/L — ABNORMAL HIGH (ref 0–37)
Albumin: 3.7 g/dL (ref 3.5–5.2)
BILIRUBIN TOTAL: 0.4 mg/dL (ref 0.2–1.2)
BUN: 22 mg/dL (ref 6–23)
CALCIUM: 9.2 mg/dL (ref 8.4–10.5)
CO2: 29 mEq/L (ref 19–32)
Chloride: 106 mEq/L (ref 96–112)
Creatinine, Ser: 1.22 mg/dL (ref 0.40–1.50)
GFR: 64.87 mL/min (ref >60.00)
GLUCOSE: 108 mg/dL — AB (ref 70–99)
Potassium: 5.1 mEq/L (ref 3.5–5.1)
Sodium: 140 mEq/L (ref 135–145)
TOTAL PROTEIN: 6.9 g/dL (ref 6.0–8.3)

## 2015-05-12 LAB — LIPID PANEL
CHOLESTEROL: 115 mg/dL (ref 0–200)
HDL: 48.5 mg/dL (ref >39.00)
LDL Cholesterol: 54 mg/dL (ref 0–99)
NONHDL: 66.12
TRIGLYCERIDES: 62 mg/dL (ref 0.0–149.0)
Total CHOL/HDL Ratio: 2
VLDL: 12.4 mg/dL (ref 0.0–40.0)

## 2015-05-12 LAB — MICROALBUMIN / CREATININE URINE RATIO
Creatinine,U: 177.1 mg/dL
Microalb Creat Ratio: 1.4 mg/g (ref 0.0–30.0)
Microalb, Ur: 2.5 mg/dL — ABNORMAL HIGH (ref 0.0–1.9)

## 2015-05-12 LAB — HEMOGLOBIN A1C: HEMOGLOBIN A1C: 6.2 % (ref 4.6–6.5)

## 2015-05-12 MED ORDER — HYDROCODONE-ACETAMINOPHEN 10-325 MG PO TABS: 1.0000 | ORAL_TABLET | ORAL | Status: DC | PRN

## 2015-05-12 NOTE — Addendum Note (Signed)
Addended by: Marchia Bond on: 05/12/2015 08:37 AM   Modules accepted: Orders

## 2015-05-12 NOTE — Assessment & Plan Note (Signed)
Well controlled on current dose of zocor. No changes made today. 

## 2015-05-12 NOTE — Assessment & Plan Note (Signed)
Has been diet controlled. Recheck labs, urine micro today. No changes made to rxs.

## 2015-05-12 NOTE — Assessment & Plan Note (Signed)
Well controlled.  No changes made. 

## 2015-05-12 NOTE — Progress Notes (Signed)
Pre visit review using our clinic review tool, if applicable. No additional management support is needed unless otherwise documented below in the visit note. 

## 2015-05-12 NOTE — Assessment & Plan Note (Signed)
Symptoms improving. Continue increase iron intake, ok to continue Vit C for improved absorption of iron. ?secondary to history of gastric bypass. Recheck cbc today. He plans on scheduling colonoscopy before 11/5.  Orders Placed This Encounter  Procedures  . Hemoglobin A1c  . Comprehensive metabolic panel  . Lipid panel  . Microalbumin / creatinine urine ratio  . CBC with Differential/Platelet
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
## 2015-05-16 DIAGNOSIS — D126 Benign neoplasm of colon, unspecified: Secondary | ICD-10-CM

## 2015-05-16 HISTORY — DX: Benign neoplasm of colon, unspecified: D12.6

## 2015-05-19 ENCOUNTER — Ambulatory Visit (AMBULATORY_SURGERY_CENTER): Payer: Self-pay

## 2015-05-19 VITALS — Ht 71.0 in | Wt 189.8 lb

## 2015-05-19 DIAGNOSIS — Z1211 Encounter for screening for malignant neoplasm of colon: Secondary | ICD-10-CM

## 2015-05-19 MED ORDER — SUPREP BOWEL PREP KIT 17.5-3.13-1.6 GM/177ML PO SOLN: 1.0000 | Freq: Once | ORAL | Status: DC

## 2015-05-19 NOTE — Progress Notes (Signed)
No allergies to eggs or soy No past problems with anesthesia No home oxygen No diet/weight loss meds  Refused emmi 

## 2015-05-26 ENCOUNTER — Telehealth: Payer: Self-pay | Admitting: Gastroenterology

## 2015-05-26 NOTE — Telephone Encounter (Signed)
Spoke with pt regarding the prep was too expensive.I informed the pt we could give him a sample of the Suprep bowel prep, but he would need to come to the office to pick it up.The pt states, "he lived in [place]" and it would  be too far to drive to our office.I also recommended the Miralax bowl prep to be bought over the counter, but I would need to give him new instructions and I could mail them to the pt.He states it was OK,he would just go buy the Suprep from the pharmacy. I informed the pt to call us if he changed his mind or had further questions.He understood.

## 2015-05-28 ENCOUNTER — Ambulatory Visit (AMBULATORY_SURGERY_CENTER): Payer: Medicare HMO | Admitting: Gastroenterology

## 2015-05-28 ENCOUNTER — Encounter: Payer: Self-pay | Admitting: Gastroenterology

## 2015-05-28 VITALS — BP 99/48 | HR 52 | Temp 96.8°F | Resp 16 | Ht 71.0 in | Wt 189.0 lb

## 2015-05-28 DIAGNOSIS — K621 Rectal polyp: Secondary | ICD-10-CM

## 2015-05-28 DIAGNOSIS — Z1211 Encounter for screening for malignant neoplasm of colon: Secondary | ICD-10-CM

## 2015-05-28 DIAGNOSIS — I2510 Atherosclerotic heart disease of native coronary artery without angina pectoris: Secondary | ICD-10-CM | POA: Diagnosis not present

## 2015-05-28 DIAGNOSIS — D124 Benign neoplasm of descending colon: Secondary | ICD-10-CM

## 2015-05-28 DIAGNOSIS — D129 Benign neoplasm of anus and anal canal: Secondary | ICD-10-CM

## 2015-05-28 DIAGNOSIS — D128 Benign neoplasm of rectum: Secondary | ICD-10-CM

## 2015-05-28 DIAGNOSIS — E119 Type 2 diabetes mellitus without complications: Secondary | ICD-10-CM | POA: Diagnosis not present

## 2015-05-28 DIAGNOSIS — Z951 Presence of aortocoronary bypass graft: Secondary | ICD-10-CM | POA: Diagnosis not present

## 2015-05-28 MED ORDER — SODIUM CHLORIDE 0.9 % IV SOLN: 500.0000 mL | INTRAVENOUS | Status: DC

## 2015-05-28 NOTE — Patient Instructions (Signed)
YOU HAD AN ENDOSCOPIC PROCEDURE TODAY AT [facility]:   Refer to the procedure report that was given to you for any specific questions about what was found during the examination.  If the procedure report does not answer your questions, please call your gastroenterologist to clarify.  If you requested that your care partner not be given the details of your procedure findings, then the procedure report has been included in a sealed envelope for you to review at your convenience later.  YOU SHOULD EXPECT: Some feelings of bloating in the abdomen. Passage of more gas than usual.  Walking can help get rid of the air that was put into your GI tract during the procedure and reduce the bloating. If you had a lower endoscopy (such as a colonoscopy or flexible sigmoidoscopy) you may notice spotting of blood in your stool or on the toilet paper. If you underwent a bowel prep for your procedure, you may not have a normal bowel movement for a few days.  Please Note:  You might notice some irritation and congestion in your nose or some drainage.  This is from the oxygen used during your procedure.  There is no need for concern and it should clear up in a day or so.  SYMPTOMS TO REPORT IMMEDIATELY:   Following lower endoscopy (colonoscopy or flexible sigmoidoscopy):  Excessive amounts of blood in the stool  Significant tenderness or worsening of abdominal pains  Swelling of the abdomen that is new, acute  Fever of 100F or higher   For urgent or emergent issues, a gastroenterologist can be reached at any hour by calling [phone].   DIET: Your first meal following the procedure should be a small meal and then it is ok to progress to your normal diet. Heavy or fried foods are harder to digest and may make you feel nauseous or bloated.  Likewise, meals heavy in dairy and vegetables can increase bloating.  Drink plenty of fluids but you should avoid alcoholic beverages for 24  hours.  ACTIVITY:  You should plan to take it easy for the rest of today and you should NOT DRIVE or use heavy machinery until tomorrow (because of the sedation medicines used during the test).    FOLLOW UP: Our staff will call the number listed on your records the next business day following your procedure to check on you and address any questions or concerns that you may have regarding the information given to you following your procedure. If we do not reach you, we will leave a message.  However, if you are feeling well and you are not experiencing any problems, there is no need to return our call.  We will assume that you have returned to your regular daily activities without incident.  If any biopsies were taken you will be contacted by phone or by letter within the next 1-3 weeks.  Please call us at [phone] if you have not heard about the biopsies in 3 weeks.    SIGNATURES/CONFIDENTIALITY: You and/or your care partner have signed paperwork which will be entered into your electronic medical record.  These signatures attest to the fact that that the information above on your After Visit Summary has been reviewed and is understood.  Full responsibility of the confidentiality of this discharge information lies with you and/or your care-partner.   Hold Aspirin and all NSAIDS for 2 weeks,but resume remainder of medications. Information given on polyps.

## 2015-05-28 NOTE — Progress Notes (Signed)
Called to room to assist during endoscopic procedure.  Patient ID and intended procedure confirmed with present staff. Received instructions for my participation in the procedure from the performing physician.  

## 2015-05-28 NOTE — Progress Notes (Signed)
To recovery, report to Brown, RN, VSS. 

## 2015-05-28 NOTE — Op Note (Signed)
[facility]  [address] [place], [address]   COLONOSCOPY PROCEDURE REPORT  PATIENT: [name], [name]  MR#: [id number] BIRTHDATE: [birth year] , 87  yrs. old GENDER: male ENDOSCOPIST: Ladene Artist, MD, Marval Regal REFERRED BY:  Arnette Norris, M.D. PROCEDURE DATE:  05/28/2015 PROCEDURE:   Colonoscopy, screening and Colonoscopy with snare polypectomy First Screening Colonoscopy - Avg.  risk and is 50 yrs.  old or older Yes.  Prior Negative Screening - Now for repeat screening. N/A  History of Adenoma - Now for follow-up colonoscopy & has been > or = to 3 yrs.  N/A  Polyps removed today? Yes ASA CLASS:   Class II INDICATIONS:Screening for colonic neoplasia and Colorectal Neoplasm Risk Assessment for this procedure is average risk. MEDICATIONS: Monitored anesthesia care and Propofol 170 mg IV DESCRIPTION OF PROCEDURE:   After the risks benefits and alternatives of the procedure were thoroughly explained, informed consent was obtained.  The digital rectal exam revealed no abnormalities of the rectum.   The LB [id number]  endoscope was introduced through the anus and advanced to the cecum, which was identified by both the appendix and ileocecal valve. No adverse events experienced.   Limited by a tortuous and redundant colon. The quality of the prep was good.  (Suprep was used)  The instrument was then slowly withdrawn as the colon was fully examined. Estimated blood loss is zero unless otherwise noted in this procedure report.  COLON FINDINGS: A sessile polyp measuring 8 mm in size was found in the descending colon.  A polypectomy was performed using snare cautery.  The resection was complete, the polyp tissue was completely retrieved and sent to histology.   Four sessile polyps measuring 5 mm in size were found in the rectum.  Polypectomies were performed with a cold snare.  The resection was complete, the polyp tissue was completely retrieved and sent to histology.    The examination was otherwise normal.  Retroflexed views revealed no abnormalities. The time to cecum = 4.2 Withdrawal time = 13.1   The scope was withdrawn and the procedure completed. COMPLICATIONS: There were no immediate complications.  ENDOSCOPIC IMPRESSION: 1.   Sessile polyp in the descending colon; polypectomy performed using snare cautery 2.   Four sessile polyps in the rectum; polypectomies performed with a cold snare 3.   The examination was otherwise normal  RECOMMENDATIONS: 1.  Hold Aspirin and all other NSAIDS for 2 weeks. 2.  Await pathology results 3.  Repeat colonoscopy in 3 years if 3-5 polyps adenomatous; 5 years if 1-2 polyps adenomatous; otherwise 10 years  eSigned:  Ladene Artist, MD, [facility] 05/28/2015 11:26 AM

## 2015-05-29 ENCOUNTER — Telehealth: Payer: Self-pay

## 2015-05-29 NOTE — Telephone Encounter (Signed)
  Follow up Call-  Call back number 05/28/2015  Post procedure Call Back phone  # [phone]  Permission to leave phone message Yes     Patient questions:  Do you have a fever, pain , or abdominal swelling? No. Pain Score  0 *  Have you tolerated food without any problems? Yes.    Have you been able to return to your normal activities? Yes.    Do you have any questions about your discharge instructions: Diet   No. Medications  No. Follow up visit  No.  Do you have questions or concerns about your Care? No.  Actions: * If pain score is 4 or above: No action needed, pain <4.

## 2015-06-01 ENCOUNTER — Other Ambulatory Visit: Payer: Self-pay | Admitting: *Deleted

## 2015-06-01 MED ORDER — ALPRAZOLAM 1 MG PO TABS: ORAL_TABLET | ORAL | Status: DC

## 2015-06-01 NOTE — Telephone Encounter (Signed)
rx called in to requested pharmacy 

## 2015-06-01 NOTE — Telephone Encounter (Signed)
Last f/u 04/2015 

## 2015-06-02 ENCOUNTER — Encounter: Payer: Self-pay | Admitting: Gastroenterology

## 2015-06-10 ENCOUNTER — Other Ambulatory Visit: Payer: Self-pay | Admitting: *Deleted

## 2015-06-10 MED ORDER — HYDROCODONE-ACETAMINOPHEN 10-325 MG PO TABS: 1.0000 | ORAL_TABLET | ORAL | Status: DC | PRN

## 2015-06-10 NOTE — Telephone Encounter (Signed)
Spoke to pt and informed him Rx is available for pickup from the front desk 

## 2015-06-11 ENCOUNTER — Encounter: Payer: Self-pay | Admitting: Family Medicine

## 2015-07-01 ENCOUNTER — Encounter: Payer: Self-pay | Admitting: Family Medicine

## 2015-07-02 ENCOUNTER — Other Ambulatory Visit: Payer: Self-pay | Admitting: *Deleted

## 2015-07-02 MED ORDER — ALPRAZOLAM 1 MG PO TABS: ORAL_TABLET | ORAL | Status: DC

## 2015-07-02 NOTE — Telephone Encounter (Signed)
Last f/u appt 04/2015 

## 2015-07-02 NOTE — Telephone Encounter (Signed)
Rx called in to requested pharmacy 

## 2015-07-13 ENCOUNTER — Other Ambulatory Visit: Payer: Self-pay

## 2015-07-13 MED ORDER — HYDROCODONE-ACETAMINOPHEN 10-325 MG PO TABS: 1.0000 | ORAL_TABLET | ORAL | Status: DC | PRN

## 2015-07-13 NOTE — Telephone Encounter (Signed)
Spoke to pt and informed him Rx is available for pickup from the front desk 

## 2015-07-13 NOTE — Telephone Encounter (Signed)
Pt left v/m requesting rx hydrocodone apap. Call when ready for pick up.rx last printed # 150 on 06/10/15. Last seen 05/12/15.

## 2015-07-22 ENCOUNTER — Other Ambulatory Visit: Payer: Self-pay | Admitting: Family Medicine

## 2015-07-31 ENCOUNTER — Other Ambulatory Visit: Payer: Self-pay | Admitting: *Deleted

## 2015-07-31 MED ORDER — ALPRAZOLAM 1 MG PO TABS: ORAL_TABLET | ORAL | Status: DC

## 2015-07-31 NOTE — Telephone Encounter (Signed)
Last f/u 04/2015 

## 2015-08-03 NOTE — Telephone Encounter (Signed)
Rx called in to requested pharmacy 

## 2015-08-04 DIAGNOSIS — Z0100 Encounter for examination of eyes and vision without abnormal findings: Secondary | ICD-10-CM | POA: Diagnosis not present

## 2015-08-04 DIAGNOSIS — E1136 Type 2 diabetes mellitus with diabetic cataract: Secondary | ICD-10-CM | POA: Diagnosis not present

## 2015-08-04 DIAGNOSIS — H524 Presbyopia: Secondary | ICD-10-CM | POA: Diagnosis not present

## 2015-08-11 ENCOUNTER — Other Ambulatory Visit: Payer: Self-pay

## 2015-08-11 MED ORDER — HYDROCODONE-ACETAMINOPHEN 10-325 MG PO TABS: 1.0000 | ORAL_TABLET | ORAL | Status: DC | PRN

## 2015-08-11 NOTE — Telephone Encounter (Signed)
Pt left v/m requesting rx hydrocodone apap. Call when ready for pick up. rx last printed # 150 on 07/13/15. Last seen 05/02/15.

## 2015-08-11 NOTE — Telephone Encounter (Signed)
RX printed and signed and given to Waynetta 

## 2015-08-11 NOTE — Telephone Encounter (Signed)
Spoke to pt and informed him Rx is available for pickup from the front desk 

## 2015-08-31 ENCOUNTER — Other Ambulatory Visit: Payer: Self-pay | Admitting: *Deleted

## 2015-08-31 MED ORDER — ALPRAZOLAM 1 MG PO TABS: ORAL_TABLET | ORAL | Status: DC

## 2015-08-31 NOTE — Telephone Encounter (Signed)
Last f/u 04/2015 

## 2015-09-01 NOTE — Telephone Encounter (Signed)
Rx called in to requested pharmacy 

## 2015-09-10 ENCOUNTER — Ambulatory Visit (INDEPENDENT_AMBULATORY_CARE_PROVIDER_SITE_OTHER): Payer: Medicare HMO | Admitting: Family Medicine

## 2015-09-10 ENCOUNTER — Encounter: Payer: Self-pay | Admitting: Family Medicine

## 2015-09-10 VITALS — BP 124/62 | HR 48 | Temp 98.3°F | Wt 195.0 lb

## 2015-09-10 DIAGNOSIS — D509 Iron deficiency anemia, unspecified: Secondary | ICD-10-CM | POA: Diagnosis not present

## 2015-09-10 DIAGNOSIS — E119 Type 2 diabetes mellitus without complications: Secondary | ICD-10-CM

## 2015-09-10 DIAGNOSIS — I10 Essential (primary) hypertension: Secondary | ICD-10-CM | POA: Diagnosis not present

## 2015-09-10 DIAGNOSIS — E785 Hyperlipidemia, unspecified: Secondary | ICD-10-CM

## 2015-09-10 DIAGNOSIS — G894 Chronic pain syndrome: Secondary | ICD-10-CM

## 2015-09-10 MED ORDER — HYDROCODONE-ACETAMINOPHEN 10-325 MG PO TABS: 1.0000 | ORAL_TABLET | ORAL | Status: DC | PRN

## 2015-09-10 NOTE — Patient Instructions (Signed)
Please schedule your wellness visit after 11/11/15.

## 2015-09-10 NOTE — Progress Notes (Signed)
Pre visit review using our clinic review tool, if applicable. No additional management support is needed unless otherwise documented below in the visit note. 

## 2015-09-10 NOTE — Assessment & Plan Note (Signed)
Continue iron. Due for CPX in 10/2015- will check CBC at that time. The patient indicates understanding of these issues and agrees with the plan.

## 2015-09-10 NOTE — Assessment & Plan Note (Signed)
Well controlled. No changes made to rxs today. 

## 2015-09-10 NOTE — Progress Notes (Signed)
DM- has been diet controlled since he gastric bypass in 2006 (lost 200 lb).    Weight stable.  Does not check FSBS regularly. Lab Results  Component Value Date   HGBA1C 6.2 05/12/2015     Denies any symptoms of hyper or hypo glycemia.   HLD-  On Zocor 20 mg daily.  Never had issues with myalgias.  Well controlled.   Lab Results  Component Value Date   CHOL 115 05/12/2015   HDL 48.50 05/12/2015   LDLCALC 54 05/12/2015   LDLDIRECT 54.0 02/10/2015   TRIG 62.0 05/12/2015   CHOLHDL 2 05/12/2015   Chronic pain- chronic low back pain.Multiple back injuries/surgery. No indication of abuse, keeps follow up appointments and no pain contract violations to date. Feels pain is reasonably controlled on current rx. Taking Norco 10-325- 1 tablet every 4 hours as needed.  Anemia-  Has been taking iron and Vit C to help with iron absorption. Feels less tired. Did have colonoscopy in 05/2015- 5 year recall due to Polyps [facility]). Lab Results  Component Value Date   WBC 5.6 05/12/2015   HGB 11.4* 05/12/2015   HCT 34.7* 05/12/2015   MCV 87.6 05/12/2015   PLT 189.0 05/12/2015    Patient Active Problem List   Diagnosis Date Noted  . Constipation 01/08/2015  . Abdominal pain, epigastric 01/08/2015  . Stress due to marital problems 01/08/2015  . Cervical adenopathy 11/11/2014  . Other nonspecific abnormal finding of lung field 09/02/2013  . Chronic pain syndrome 09/02/2013  . Alopecia areata 02/26/2013  . Vitamin D deficiency 09/03/2009  . CAD, ARTERY BYPASS GRAFT 05/20/2009  . Diabetes mellitus type 2, diet-controlled ([place]) 12/11/2006  . HLD (hyperlipidemia) 12/11/2006  . Iron deficiency anemia 12/11/2006  . TOBACCO ABUSE 12/11/2006  . Essential hypertension 12/11/2006  . MYOCARDIAL INFARCTION, HX OF 12/11/2006  . CARDIOMYOPATHY, ISCHEMIC 12/11/2006  . PULMONARY EMBOLISM, HX OF 12/11/2006   Past Medical History  Diagnosis Date  . Diabetes mellitus   . Hyperlipidemia   .  Hypertension   . Low back pain   . MI (myocardial infarction) ([place])   . Arthritis   . Tobacco abuse   . Left ventricular dysfunction   . Obesity   . CAD (coronary artery disease)   . PE (pulmonary embolism)   . Anemia    Past Surgical History  Procedure Laterality Date  . Coronary stent placement      3 all in right side  . Gastric bypass    . Coronary artery bypass graft    . Cholecystectomy    . Hernia repair     Social History  Substance Use Topics  . Smoking status: Current Every Day Smoker -- 1.00 packs/day for 40 years    Types: Cigarettes  . Smokeless tobacco: Former Systems developer  . Alcohol Use: No   Family History  Problem Relation Age of Onset  . Emphysema Mother   . Heart attack Father   . Heart disease Daughter   . Heart disease Brother   . Arthritis Brother   . Hemophilia Brother   . Colon cancer Neg Hx    Allergies  Allergen Reactions  . Codeine     REACTION: makes me "looney"   Current Outpatient Prescriptions on File Prior to Visit  Medication Sig Dispense Refill  . ALPRAZolam (XANAX) 1 MG tablet TAKE ONE TABLET BY MOUTH THREE TIMES DAILY AS NEEDED FOR ANXIETY 90 tablet 0  . aspirin 81 MG tablet Take 81 mg by  mouth daily.      . Calcium Citrate-Vitamin D 500-400 MG-UNIT CHEW Chew 1 tablet by mouth 2 (two) times daily.      . Cholecalciferol (VITAMIN D) 2000 UNITS tablet Take 5,000 Units by mouth daily.     . cyanocobalamin 500 MCG tablet Take 2,500 mcg by mouth daily.    Marland Kitchen docusate sodium (COLACE) 100 MG capsule Take 100 mg by mouth as needed.      . fexofenadine (ALLEGRA) 180 MG tablet Take 180 mg by mouth daily as needed.    . metoprolol succinate (TOPROL-XL) 25 MG 24 hr tablet TAKE 1 TABLET BY MOUTH TWICE DAILY 60 tablet 11  . Multiple Vitamin (MULTIVITAMIN) tablet Take 1 tablet by mouth daily.      . nitroGLYCERIN (NITROSTAT) 0.3 MG SL tablet Place 1 tablet (0.3 mg total) under the tongue every 5 (five) minutes as needed. 90 tablet 3  . omeprazole  (PRILOSEC) 20 MG capsule Take 1 capsule (20 mg total) by mouth 2 (two) times daily before a meal. 30 capsule 3  . simvastatin (ZOCOR) 40 MG tablet TAKE 1 TABLET BY MOUTH AT BEDTIME FOR CHOLESTEROL 90 tablet 0   No current facility-administered medications on file prior to visit.   The PMH, PSH, Social History, Family History, Medications, and allergies have been reviewed in [facility], and have been updated if relevant.   Review of Systems  Review of Systems  Constitutional: Negative for fatigue and unexpected weight change.  HENT: Negative.   Eyes: Negative.   Respiratory: Negative.   Cardiovascular: Negative.   Gastrointestinal: Negative for nausea, vomiting, abdominal pain, diarrhea, abdominal distention, anal bleeding and rectal pain.  Endocrine: Negative.   Genitourinary: Negative.   Musculoskeletal: Negative.   Skin: Negative.   Allergic/Immunologic: Negative.   Hematological: Negative.   Psychiatric/Behavioral: Negative.   All other systems reviewed and are negative.     Physical Exam BP 124/62 mmHg  Pulse 48  Temp(Src) 98.3 F (36.8 C) (Oral)  Wt 195 lb (88.451 kg)  SpO2 96% Wt Readings from Last 3 Encounters:  09/10/15 195 lb (88.451 kg)  05/28/15 189 lb (85.73 kg)  05/19/15 189 lb 12.8 oz (86.093 kg)   Physical Exam  Constitutional: He is oriented to person, place, and time and well-developed, well-nourished, and in no distress. No distress.  HENT:  Head: Normocephalic and atraumatic.  Eyes: Conjunctivae are normal.  Neck: Normal range of motion.  Cardiovascular: Normal rate and normal heart sounds.   Pulmonary/Chest: Effort normal and breath sounds normal. No respiratory distress.  Abdominal: Soft.  Musculoskeletal: Normal range of motion. He exhibits no edema.  Neurological: He is alert and oriented to person, place, and time.  Skin: Skin is warm and dry. He is not diaphoretic.  Psychiatric: Mood, memory, affect and judgment normal.  Nursing note and vitals  reviewed.

## 2015-09-10 NOTE — Assessment & Plan Note (Signed)
Diet controlled.  

## 2015-09-10 NOTE — Assessment & Plan Note (Signed)
Compliant with pain contract. Rx printed and given to pt today.

## 2015-09-30 ENCOUNTER — Other Ambulatory Visit: Payer: Self-pay | Admitting: *Deleted

## 2015-09-30 MED ORDER — ALPRAZOLAM 1 MG PO TABS: ORAL_TABLET | ORAL | Status: DC

## 2015-09-30 NOTE — Telephone Encounter (Signed)
Received faxed refill request from pharmacy Last refill 08/31/15 #90 Last office visit 09/10/15 Is it okay to refill?

## 2015-09-30 NOTE — Telephone Encounter (Signed)
Rx called to pharmacy as instructed. 

## 2015-10-02 ENCOUNTER — Other Ambulatory Visit: Payer: Self-pay | Admitting: *Deleted

## 2015-10-02 NOTE — Telephone Encounter (Signed)
Last f/u 12/2014 

## 2015-10-12 ENCOUNTER — Other Ambulatory Visit: Payer: Self-pay

## 2015-10-12 MED ORDER — HYDROCODONE-ACETAMINOPHEN 10-325 MG PO TABS: 1.0000 | ORAL_TABLET | ORAL | Status: DC | PRN

## 2015-10-12 NOTE — Telephone Encounter (Signed)
Pt left v/m requesting rx hydrocodone apap. Call when ready for pick up. rx last printed # 150 on 09/10/15 and last seen 09/10/15.

## 2015-10-12 NOTE — Telephone Encounter (Signed)
Spoke to pt and informed him Rx is available for pickup from front desk

## 2015-10-14 ENCOUNTER — Ambulatory Visit (INDEPENDENT_AMBULATORY_CARE_PROVIDER_SITE_OTHER): Payer: Medicare HMO | Admitting: Family Medicine

## 2015-10-14 ENCOUNTER — Encounter: Payer: Self-pay | Admitting: Family Medicine

## 2015-10-14 VITALS — BP 110/60 | HR 59 | Temp 97.8°F | Wt 191.0 lb

## 2015-10-14 DIAGNOSIS — K432 Incisional hernia without obstruction or gangrene: Secondary | ICD-10-CM | POA: Diagnosis not present

## 2015-10-14 NOTE — Progress Notes (Signed)
Subjective:   Patient ID: [name], male    DOB: [birth year], 60 y.o.   MRN: [id number]  [name] is a pleasant 61 y.o. year old male who presents to clinic today with Hernia  on 10/14/2015  HPI:  ?hernia- a week ago, noticed a bulge from the base of his sternum where he had previous CABG (over 10 years ago).  Bulge sticks out more when he strains. Not painful.  Did have some abdominal pain but not at that site.  He thinks it was reflux- symptoms improved with acid reducers.  No nausea, vomiting or fevers. No black or bloody stools.  Current Outpatient Prescriptions on File Prior to Visit  Medication Sig Dispense Refill  . ALPRAZolam (XANAX) 1 MG tablet TAKE ONE TABLET BY MOUTH THREE TIMES DAILY AS NEEDED FOR ANXIETY 90 tablet 0  . aspirin 81 MG tablet Take 81 mg by mouth daily.      . Calcium Citrate-Vitamin D 500-400 MG-UNIT CHEW Chew 1 tablet by mouth 2 (two) times daily.      . Cholecalciferol (VITAMIN D) 2000 UNITS tablet Take 5,000 Units by mouth daily.     . cyanocobalamin 500 MCG tablet Take 2,500 mcg by mouth daily.    Marland Kitchen docusate sodium (COLACE) 100 MG capsule Take 100 mg by mouth as needed.      . fexofenadine (ALLEGRA) 180 MG tablet Take 180 mg by mouth daily as needed.    Marland Kitchen HYDROcodone-acetaminophen (NORCO) 10-325 MG tablet Take 1 tablet by mouth every 4 (four) hours as needed for severe pain. 150 tablet 0  . metoprolol succinate (TOPROL-XL) 25 MG 24 hr tablet TAKE 1 TABLET BY MOUTH TWICE DAILY 60 tablet 11  . Multiple Vitamin (MULTIVITAMIN) tablet Take 1 tablet by mouth daily.      . nitroGLYCERIN (NITROSTAT) 0.3 MG SL tablet Place 1 tablet (0.3 mg total) under the tongue every 5 (five) minutes as needed. 90 tablet 3  . omeprazole (PRILOSEC) 20 MG capsule Take 1 capsule (20 mg total) by mouth 2 (two) times daily before a meal. 30 capsule 3  . simvastatin (ZOCOR) 40 MG tablet TAKE 1 TABLET BY MOUTH AT BEDTIME FOR CHOLESTEROL 90 tablet 0   No current  facility-administered medications on file prior to visit.    Allergies  Allergen Reactions  . Codeine     REACTION: makes me "looney"    Past Medical History  Diagnosis Date  . Diabetes mellitus   . Hyperlipidemia   . Hypertension   . Low back pain   . MI (myocardial infarction) ([place])   . Arthritis   . Tobacco abuse   . Left ventricular dysfunction   . Obesity   . CAD (coronary artery disease)   . PE (pulmonary embolism)   . Anemia     Past Surgical History  Procedure Laterality Date  . Coronary stent placement      3 all in right side  . Gastric bypass    . Coronary artery bypass graft    . Cholecystectomy    . Hernia repair      Family History  Problem Relation Age of Onset  . Emphysema Mother   . Heart attack Father   . Heart disease Daughter   . Heart disease Brother   . Arthritis Brother   . Hemophilia Brother   . Colon cancer Neg Hx     Social History   Social History  . Marital Status: Widowed  Spouse Name: N/A  . Number of Children: N/A  . Years of Education: N/A   Occupational History  . Not on file.   Social History Main Topics  . Smoking status: Current Every Day Smoker -- 1.00 packs/day for 40 years    Types: Cigarettes  . Smokeless tobacco: Former Systems developer  . Alcohol Use: No  . Drug Use: No  . Sexual Activity: Not on file   Other Topics Concern  . Not on file   Social History Narrative   Would desire CPR.   Does not desire long term life support or feeding tubes.         The PMH, PSH, Social History, Family History, Medications, and allergies have been reviewed in [facility], and have been updated if relevant.   Review of Systems  Constitutional: Negative.   Cardiovascular: Negative.   Gastrointestinal: Positive for abdominal distention. Negative for nausea, vomiting, abdominal pain, diarrhea, constipation, blood in stool, anal bleeding and rectal pain.  Genitourinary: Negative.   Neurological: Negative.   Hematological: Negative.    Psychiatric/Behavioral: Negative.   All other systems reviewed and are negative.      Objective:    BP 110/60 mmHg  Pulse 59  Temp(Src) 97.8 F (36.6 C) (Tympanic)  Wt 191 lb (86.637 kg)  SpO2 96%   Physical Exam  Constitutional: He is oriented to person, place, and time. He appears well-developed and well-nourished. No distress.  HENT:  Head: Normocephalic.  Eyes: Conjunctivae are normal.  Cardiovascular: Normal heart sounds.   Pulmonary/Chest: Effort normal.  Abdominal:    Musculoskeletal: Normal range of motion.  Neurological: He is alert and oriented to person, place, and time. No cranial nerve deficit.  Skin: Skin is warm and dry. He is not diaphoretic.  Psychiatric: He has a normal mood and affect. His behavior is normal. Judgment and thought content normal.  Nursing note and vitals reviewed.         Assessment & Plan:   Incisional hernia, without obstruction or gangrene No Follow-up on file.

## 2015-10-14 NOTE — Assessment & Plan Note (Addendum)
New- refer to general surgery for further evaluation and repair. Discussed red flag symptoms which would warrant urgent evaluation. The patient indicates understanding of these issues and agrees with the plan. Orders Placed This Encounter  Procedures  . Ambulatory referral to General Surgery

## 2015-10-14 NOTE — Progress Notes (Signed)
Pre visit review using our clinic review tool, if applicable. No additional management support is needed unless otherwise documented below in the visit note. 

## 2015-10-14 NOTE — Patient Instructions (Signed)
Great to see you. We will call you with your surgery appointment.

## 2015-10-30 ENCOUNTER — Other Ambulatory Visit: Payer: Self-pay | Admitting: *Deleted

## 2015-10-30 DIAGNOSIS — R69 Illness, unspecified: Secondary | ICD-10-CM | POA: Diagnosis not present

## 2015-10-30 DIAGNOSIS — K432 Incisional hernia without obstruction or gangrene: Secondary | ICD-10-CM | POA: Diagnosis not present

## 2015-10-30 MED ORDER — ALPRAZOLAM 1 MG PO TABS: ORAL_TABLET | ORAL | Status: DC

## 2015-10-30 NOTE — Telephone Encounter (Signed)
Rx called in to requested pharmacy 

## 2015-10-30 NOTE — Telephone Encounter (Signed)
Last f/u 08/2015 

## 2015-11-12 ENCOUNTER — Other Ambulatory Visit: Payer: Self-pay | Admitting: Family Medicine

## 2015-11-12 ENCOUNTER — Encounter: Payer: Medicare HMO | Admitting: Family Medicine

## 2015-11-12 ENCOUNTER — Telehealth: Payer: Self-pay | Admitting: Family Medicine

## 2015-11-12 ENCOUNTER — Ambulatory Visit (INDEPENDENT_AMBULATORY_CARE_PROVIDER_SITE_OTHER): Payer: Medicare HMO

## 2015-11-12 VITALS — BP 122/72 | HR 57 | Temp 97.8°F | Ht 72.0 in | Wt 196.8 lb

## 2015-11-12 DIAGNOSIS — Z0000 Encounter for general adult medical examination without abnormal findings: Secondary | ICD-10-CM

## 2015-11-12 MED ORDER — HYDROCODONE-ACETAMINOPHEN 10-325 MG PO TABS: 1.0000 | ORAL_TABLET | ORAL | Status: DC | PRN

## 2015-11-12 NOTE — Patient Instructions (Addendum)
[name] , Thank you for taking time to come for your Medicare Wellness Visit. I appreciate your ongoing commitment to your health goals. Please review the following plan we discussed and let me know if I can assist you in the future.    This is a list of the screening recommended for you and due dates:  Health Maintenance  Topic Date Due  . Eye exam for diabetics  08/15/2016  . Hemoglobin A1C  11/09/2015  . Flu Shot  03/15/2016  . Complete foot exam   05/11/2016  . Urine Protein Check  05/11/2016  . Tetanus Vaccine  07/23/2018  . Colon Cancer Screening  05/27/2020  . Pneumococcal vaccine  Completed  .  Hepatitis C: One time screening is recommended by Center for Disease Control  (CDC) for  adults born from [birth year] through 1964.   Completed  . HIV Screening  Completed   Preventive Care for Adults  A healthy lifestyle and preventive care can promote health and wellness. Preventive health guidelines for adults include the following key practices.  . A routine yearly physical is a good way to check with your health care provider about your health and preventive screening. It is a chance to share any concerns and updates on your health and to receive a thorough exam.  . Visit your dentist for a routine exam and preventive care every 6 months. Brush your teeth twice a day and floss once a day. Good oral hygiene prevents tooth decay and gum disease.  . The frequency of eye exams is based on your age, health, family medical history, use  of contact lenses, and other factors. Follow your health care provider's ecommendations for frequency of eye exams.  . Eat a healthy diet. Foods like vegetables, fruits, whole grains, low-fat dairy products, and lean protein foods contain the nutrients you need without too many calories. Decrease your intake of foods high in solid fats, added sugars, and salt. Eat the right amount of calories for you. Get information about a proper diet from your health care  provider, if necessary.  . Regular physical exercise is one of the most important things you can do for your health. Most adults should get at least 150 minutes of moderate-intensity exercise (any activity that increases your heart rate and causes you to sweat) each week. In addition, most adults need muscle-strengthening exercises on 2 or more days a week.  Silver Sneakers may be a benefit available to you. To determine eligibility, you may visit the website: www.silversneakers.com or contact program at [phone] Mon-Fri between 8AM-8PM.   . Maintain a healthy weight. The body mass index (BMI) is a screening tool to identify possible weight problems. It provides an estimate of body fat based on height and weight. Your health care provider can find your BMI and can help you achieve or maintain a healthy weight.   For adults 20 years and older: ? A BMI below 18.5 is considered underweight. ? A BMI of 18.5 to 24.9 is normal. ? A BMI of 25 to 29.9 is considered overweight. ? A BMI of 30 and above is considered obese.   . Maintain normal blood lipids and cholesterol levels by exercising and minimizing your intake of saturated fat. Eat a balanced diet with plenty of fruit and vegetables. Blood tests for lipids and cholesterol should begin at age 97 and be repeated every 5 years. If your lipid or cholesterol levels are high, you are over 50, or you are at high  risk for heart disease, you may need your cholesterol levels checked more frequently. Ongoing high lipid and cholesterol levels should be treated with medicines if diet and exercise are not working.  . If you smoke, find out from your health care provider how to quit. If you do not use tobacco, please do not start.  . If you choose to drink alcohol, please do not consume more than 2 drinks per day. One drink is considered to be 12 ounces (355 mL) of beer, 5 ounces (148 mL) of wine, or 1.5 ounces (44 mL) of liquor.  . If you are 36-79 years  old, ask your health care provider if you should take aspirin to prevent strokes.  . Use sunscreen. Apply sunscreen liberally and repeatedly throughout the day. You should seek shade when your shadow is shorter than you. Protect yourself by wearing long sleeves, pants, a wide-brimmed hat, and sunglasses year round, whenever you are outdoors.  . Once a month, do a whole body skin exam, using a mirror to look at the skin on your back. Tell your health care provider of new moles, moles that have irregular borders, moles that are larger than a pencil eraser, or moles that have changed in shape or color.

## 2015-11-12 NOTE — Telephone Encounter (Signed)
PCP out of office. Pt here for medicare wellness visit, requests refill hydrocodone. Chart reviewed. Rx printed and handed to [place].

## 2015-11-12 NOTE — Progress Notes (Signed)
Pre visit review using our clinic review tool, if applicable. No additional management support is needed unless otherwise documented below in the visit note. 

## 2015-11-12 NOTE — Progress Notes (Signed)
Subjective:   [name] is a 60 y.o. male who presents for Medicare Annual/Subsequent preventive examination.  Cardiac Risk Factors include: advanced age (>45men, >64 women);diabetes mellitus;dyslipidemia;hypertension;male gender;smoking/ tobacco exposure     Objective:    Vitals: BP 122/72 mmHg  Pulse 57  Temp(Src) 97.8 F (36.6 C) (Oral)  Ht 6' (1.829 m)  Wt 196 lb 12 oz (89.245 kg)  BMI 26.68 kg/m2  SpO2 95%  Body mass index is 26.68 kg/(m^2).  Tobacco History  Smoking status  . Current Every Day Smoker -- 1.00 packs/day for 40 years  . Types: Cigarettes  Smokeless tobacco  . Former Systems developer     Ready to quit: No Counseling given: No   Past Medical History  Diagnosis Date  . Diabetes mellitus   . Hyperlipidemia   . Hypertension   . Low back pain   . MI (myocardial infarction) ([place])   . Arthritis   . Tobacco abuse   . Left ventricular dysfunction   . Obesity   . CAD (coronary artery disease)   . PE (pulmonary embolism)   . Anemia    Past Surgical History  Procedure Laterality Date  . Coronary stent placement      3 all in right side  . Gastric bypass    . Coronary artery bypass graft    . Cholecystectomy    . Hernia repair     Family History  Problem Relation Age of Onset  . Emphysema Mother   . Heart attack Father   . Heart disease Daughter   . Heart disease Brother   . Arthritis Brother   . Hemophilia Brother   . Colon cancer Neg Hx    History  Sexual Activity  . Sexual Activity: Yes    Outpatient Encounter Prescriptions as of 11/12/2015  Medication Sig  . ALPRAZolam (XANAX) 1 MG tablet TAKE ONE TABLET BY MOUTH THREE TIMES DAILY AS NEEDED FOR ANXIETY  . aspirin 81 MG tablet Take 81 mg by mouth daily.    . Calcium Citrate-Vitamin D 500-400 MG-UNIT CHEW Chew 1 tablet by mouth 2 (two) times daily.    . Cholecalciferol (VITAMIN D) 2000 UNITS tablet Take 5,000 Units by mouth daily.   . cyanocobalamin 500 MCG tablet Take 2,500 mcg by mouth  daily.  Marland Kitchen docusate sodium (COLACE) 100 MG capsule Take 100 mg by mouth as needed.    . fexofenadine (ALLEGRA) 180 MG tablet Take 180 mg by mouth daily as needed.  Marland Kitchen HYDROcodone-acetaminophen (NORCO) 10-325 MG tablet Take 1 tablet by mouth every 4 (four) hours as needed for severe pain.  . metoprolol succinate (TOPROL-XL) 25 MG 24 hr tablet TAKE 1 TABLET BY MOUTH TWICE DAILY  . Multiple Vitamin (MULTIVITAMIN) tablet Take 1 tablet by mouth daily.    . nitroGLYCERIN (NITROSTAT) 0.3 MG SL tablet Place 1 tablet (0.3 mg total) under the tongue every 5 (five) minutes as needed.  Marland Kitchen omeprazole (PRILOSEC) 20 MG capsule Take 1 capsule (20 mg total) by mouth 2 (two) times daily before a meal.  . simvastatin (ZOCOR) 40 MG tablet TAKE 1 TABLET BY MOUTH AT BEDTIME FOR CHOLESTEROL   No facility-administered encounter medications on file as of 11/12/2015.    Activities of Daily Living In your present state of health, do you have any difficulty performing the following activities: 11/12/2015  Hearing? N  Vision? N  Difficulty concentrating or making decisions? N  Walking or climbing stairs? N  Dressing or bathing? N  Doing errands,  shopping? N  Preparing Food and eating ? N  Using the Toilet? N  In the past six months, have you accidently leaked urine? N  Do you have problems with loss of bowel control? N  Managing your Medications? N  Managing your Finances? N  Housekeeping or managing your Housekeeping? N    Patient Care Team: Lucille Passy, MD as PCP - General Sherren Mocha, MD as Consulting Physician (Cardiology)   Assessment:     Hearing Screening   125Hz  250Hz  500Hz  1000Hz  2000Hz  4000Hz  8000Hz   Right ear:   40 40 40 40   Left ear:   40 40 0 0   Vision Screening Comments: Last eye in Jan 2017  Exercise Activities and Dietary recommendations Current Exercise Habits: Home exercise routine, Type of exercise: walking, Time (Minutes): 60, Frequency (Times/Week): 5, Weekly Exercise  (Minutes/Week): 300, Intensity: Mild, Exercise limited by: None identified  Fall Risk Fall Risk  11/12/2015 11/11/2014 11/06/2013 10/31/2012  Falls in the past year? No No No No   Depression Screen PHQ 2/9 Scores 11/12/2015 11/11/2014 11/06/2013 10/31/2012  PHQ - 2 Score 0 0 0 0    Cognitive Testing MMSE - Mini Mental State Exam 11/12/2015  Orientation to time 5  Orientation to Place 5  Registration 3  Attention/ Calculation 0  Recall 3  Language- name 2 objects 0  Language- repeat 1  Language- follow 3 step command 3  Language- read & follow direction 0  Write a sentence 0  Copy design 0  Total score 20   PLEASE NOTE: A Mini-Cog screen was completed. Maximum score is 20. A value of 0 denotes this part of [place] MMSE was not completed.  Orientation to Time - Max 5 Orientation to Place - Max 5 Registration - Max 3 Recall - Max 3 Language Repeat - Max 1 Language Follow 3 Step Command - Max 3   Immunization History  Administered Date(s) Administered  . Influenza Split 05/04/2011, 04/30/2012  . Influenza Whole 05/15/2006  . Influenza,inj,Quad PF,36+ Mos 04/29/2013, 06/05/2014, 05/12/2015  . Pneumococcal Polysaccharide-23 05/15/2004, 10/31/2012  . Td 07/23/2008   Screening Tests Health Maintenance  Topic Date Due  . HEMOGLOBIN A1C  02/03/2016 (Originally 11/09/2015)  . INFLUENZA VACCINE  03/15/2016  . FOOT EXAM  05/11/2016  . URINE MICROALBUMIN  05/11/2016  . OPHTHALMOLOGY EXAM  08/29/2016  . TETANUS/TDAP  07/23/2018  . COLONOSCOPY  05/27/2020  . PNEUMOCOCCAL POLYSACCHARIDE VACCINE  Completed  . Hepatitis C Screening  Completed  . HIV Screening  Completed      Plan:     I have personally reviewed and addressed the Medicare Annual Wellness questionnaire and have noted the following in the patient's chart:  A. Medical and social history B. Use of alcohol, tobacco or illicit drugs  C. Current medications and supplements D. Functional ability and status E.    Nutritional status F.  Physical activity G. Advance directives H. List of other physicians I.  Hospitalizations, surgeries, and ER visits in previous 12 months J.  [place] to include hearing, vision, cognitive, depression L. Referrals and appointments - none  In addition, I have reviewed and discussed with patient certain preventive protocols, quality metrics, and best practice recommendations. A written personalized care plan for preventive services as well as general preventive health recommendations were provided to patient.  See attached scanned questionnaire for additional information.   Signed,   Lindell Noe, MHA, BS, LPN Health Advisor [id number]

## 2015-11-13 NOTE — Progress Notes (Signed)
I reviewed health advisor's note, was available for consultation, and agree with documentation and plan.  

## 2015-12-04 ENCOUNTER — Other Ambulatory Visit: Payer: Self-pay | Admitting: *Deleted

## 2015-12-04 MED ORDER — ALPRAZOLAM 1 MG PO TABS: ORAL_TABLET | ORAL | Status: DC

## 2015-12-04 NOTE — Telephone Encounter (Signed)
Has been receiving refills of alprazolam for years, okay to refill as he will go through withdrawal without.

## 2015-12-04 NOTE — Telephone Encounter (Signed)
Rx called in to requested pharmacy 

## 2015-12-04 NOTE — Telephone Encounter (Signed)
No Dx for pts medication request. No f/u for anxiety

## 2015-12-10 ENCOUNTER — Other Ambulatory Visit: Payer: Self-pay

## 2015-12-10 MED ORDER — HYDROCODONE-ACETAMINOPHEN 10-325 MG PO TABS: 1.0000 | ORAL_TABLET | ORAL | Status: DC | PRN

## 2015-12-10 NOTE — Telephone Encounter (Signed)
Lm on pts vm and informed him Rx is available for pickup from the front desk. Pt advised third party unable to pickup  

## 2015-12-10 NOTE — Telephone Encounter (Signed)
Pt left v/m requesting rx hydrocodone apap. Call when ready for pick up.last printed # 150 on 11/12/15. Last f/u 09/10/15.

## 2015-12-11 ENCOUNTER — Encounter: Payer: Self-pay | Admitting: Family Medicine

## 2015-12-28 ENCOUNTER — Encounter: Payer: Self-pay | Admitting: Family Medicine

## 2015-12-31 ENCOUNTER — Other Ambulatory Visit: Payer: Self-pay | Admitting: *Deleted

## 2015-12-31 MED ORDER — ALPRAZOLAM 1 MG PO TABS: ORAL_TABLET | ORAL | Status: DC

## 2015-12-31 NOTE — Telephone Encounter (Signed)
Rx called in to requested pharmacy 

## 2015-12-31 NOTE — Telephone Encounter (Signed)
Pt has not had any f/u discussing anxiety. No Dx

## 2016-01-12 ENCOUNTER — Other Ambulatory Visit: Payer: Self-pay

## 2016-01-12 MED ORDER — HYDROCODONE-ACETAMINOPHEN 10-325 MG PO TABS: 1.0000 | ORAL_TABLET | ORAL | Status: DC | PRN

## 2016-01-12 NOTE — Telephone Encounter (Signed)
Spoke to pt and informed him Rx is available for pickup at the front desk

## 2016-01-12 NOTE — Telephone Encounter (Signed)
Pt left v/m requesting rx hydrocodone apap. Call when ready for pick up. rx last printed # 150 on 12/10/15. Pt last seen for f/u on 09/10/15.Please advise.

## 2016-02-05 ENCOUNTER — Telehealth: Payer: Self-pay | Admitting: *Deleted

## 2016-02-05 NOTE — Telephone Encounter (Signed)
Pt has not had any recent f/u for anxiety. Not on current Dx

## 2016-02-05 NOTE — Telephone Encounter (Signed)
Added to diagnosis list.  Ok to refill.

## 2016-02-08 ENCOUNTER — Other Ambulatory Visit: Payer: Self-pay | Admitting: Family Medicine

## 2016-02-08 DIAGNOSIS — E785 Hyperlipidemia, unspecified: Secondary | ICD-10-CM

## 2016-02-08 DIAGNOSIS — I10 Essential (primary) hypertension: Secondary | ICD-10-CM

## 2016-02-08 DIAGNOSIS — E119 Type 2 diabetes mellitus without complications: Secondary | ICD-10-CM

## 2016-02-09 ENCOUNTER — Encounter: Payer: Self-pay | Admitting: Family Medicine

## 2016-02-09 ENCOUNTER — Ambulatory Visit (INDEPENDENT_AMBULATORY_CARE_PROVIDER_SITE_OTHER): Payer: Medicare HMO | Admitting: Family Medicine

## 2016-02-09 ENCOUNTER — Other Ambulatory Visit: Payer: Medicare HMO

## 2016-02-09 VITALS — BP 110/60 | HR 60 | Temp 98.1°F | Wt 185.5 lb

## 2016-02-09 DIAGNOSIS — G894 Chronic pain syndrome: Secondary | ICD-10-CM | POA: Diagnosis not present

## 2016-02-09 DIAGNOSIS — E785 Hyperlipidemia, unspecified: Secondary | ICD-10-CM | POA: Diagnosis not present

## 2016-02-09 DIAGNOSIS — Z0000 Encounter for general adult medical examination without abnormal findings: Secondary | ICD-10-CM | POA: Diagnosis not present

## 2016-02-09 DIAGNOSIS — D509 Iron deficiency anemia, unspecified: Secondary | ICD-10-CM

## 2016-02-09 DIAGNOSIS — E119 Type 2 diabetes mellitus without complications: Secondary | ICD-10-CM | POA: Diagnosis not present

## 2016-02-09 DIAGNOSIS — I252 Old myocardial infarction: Secondary | ICD-10-CM

## 2016-02-09 DIAGNOSIS — F411 Generalized anxiety disorder: Secondary | ICD-10-CM

## 2016-02-09 DIAGNOSIS — I25700 Atherosclerosis of coronary artery bypass graft(s), unspecified, with unstable angina pectoris: Secondary | ICD-10-CM

## 2016-02-09 DIAGNOSIS — I10 Essential (primary) hypertension: Secondary | ICD-10-CM | POA: Diagnosis not present

## 2016-02-09 LAB — COMPREHENSIVE METABOLIC PANEL
ALT: 11 U/L (ref 0–53)
AST: 25 U/L (ref 0–37)
Albumin: 3.8 g/dL (ref 3.5–5.2)
Alkaline Phosphatase: 47 U/L (ref 39–117)
BILIRUBIN TOTAL: 0.4 mg/dL (ref 0.2–1.2)
BUN: 18 mg/dL (ref 6–23)
CALCIUM: 9.2 mg/dL (ref 8.4–10.5)
CO2: 31 meq/L (ref 19–32)
CREATININE: 1.27 mg/dL (ref 0.40–1.50)
Chloride: 106 mEq/L (ref 96–112)
GFR: 61.77 mL/min (ref >60.00)
Glucose, Bld: 105 mg/dL — ABNORMAL HIGH (ref 70–99)
Potassium: 4.3 mEq/L (ref 3.5–5.1)
Sodium: 139 mEq/L (ref 135–145)
TOTAL PROTEIN: 7.2 g/dL (ref 6.0–8.3)

## 2016-02-09 LAB — CBC WITH DIFFERENTIAL/PLATELET
BASOS ABS: 0 10*3/uL (ref 0.0–0.1)
Basophils Relative: 0.5 % (ref 0.0–3.0)
Eosinophils Absolute: 0.1 10*3/uL (ref 0.0–0.7)
Eosinophils Relative: 2 % (ref 0.0–5.0)
HEMATOCRIT: 40 % (ref 39.0–52.0)
Hemoglobin: 13.2 g/dL (ref 13.0–17.0)
LYMPHS PCT: 32.6 % (ref 12.0–46.0)
Lymphs Abs: 1.9 10*3/uL (ref 0.7–4.0)
MCHC: 33 g/dL (ref 30.0–36.0)
MCV: 91.6 fl (ref 78.0–100.0)
MONOS PCT: 8.1 % (ref 3.0–12.0)
Monocytes Absolute: 0.5 10*3/uL (ref 0.1–1.0)
NEUTROS ABS: 3.4 10*3/uL (ref 1.4–7.7)
Neutrophils Relative %: 56.8 % (ref 43.0–77.0)
PLATELETS: 176 10*3/uL (ref 150.0–400.0)
RBC: 4.37 Mil/uL (ref 4.22–5.81)
RDW: 16.9 % — ABNORMAL HIGH (ref 11.5–15.5)
WBC: 5.9 10*3/uL (ref 4.0–10.5)

## 2016-02-09 LAB — LIPID PANEL
CHOL/HDL RATIO: 3
Cholesterol: 117 mg/dL (ref 0–200)
HDL: 39.9 mg/dL (ref >39.00)
LDL CALC: 65 mg/dL (ref 0–99)
NONHDL: 76.98
TRIGLYCERIDES: 61 mg/dL (ref 0.0–149.0)
VLDL: 12.2 mg/dL (ref 0.0–40.0)

## 2016-02-09 LAB — HEMOGLOBIN A1C: HEMOGLOBIN A1C: 5.9 % (ref 4.6–6.5)

## 2016-02-09 MED ORDER — ALPRAZOLAM 1 MG PO TABS: ORAL_TABLET | ORAL | Status: DC

## 2016-02-09 MED ORDER — HYDROCODONE-ACETAMINOPHEN 10-325 MG PO TABS: 1.0000 | ORAL_TABLET | ORAL | Status: DC | PRN

## 2016-02-09 NOTE — Assessment & Plan Note (Signed)
Well controlled. No changes made today. 

## 2016-02-09 NOTE — Assessment & Plan Note (Signed)
Has been well controlled on current dose of zocor. Check labs today. Orders Placed This Encounter  Procedures  . CBC with Differential/Platelet

## 2016-02-09 NOTE — Assessment & Plan Note (Signed)
Quiet On ASA, beta blocker and statin.

## 2016-02-09 NOTE — Progress Notes (Signed)
Pre visit review using our clinic review tool, if applicable. No additional management support is needed unless otherwise documented below in the visit note. 

## 2016-02-09 NOTE — Progress Notes (Signed)
Pleasant 60 yo male with h/o DM, HLD, chronic pain, anxiety, presents for follow up of chronic medical conditions.  Medicare annual wellness visit with Candis Musa on 11/12/15.  Reviewed.    DM- has been diet controlled since he gastric bypass in 2006 (lost 200 lb).    Weight stable.  Does not check FSBS regularly. Lab Results  Component Value Date   HGBA1C 6.2 05/12/2015     Denies any symptoms of hyper or hypo glycemia.  Weight loss but usually loses 10 pounds in the summer.  Appetite is good.    HLD-  On Zocor 20 mg daily.  Never had issues with myalgias.  Well controlled.   Lab Results  Component Value Date   CHOL 115 05/12/2015   HDL 48.50 05/12/2015   LDLCALC 54 05/12/2015   LDLDIRECT 54.0 02/10/2015   TRIG 62.0 05/12/2015   CHOLHDL 2 05/12/2015   Chronic pain- chronic low back pain.Multiple back injuries/surger Feels pain is reasonably controlled on current rx. Taking Norco 10-325- 1 tablet every 4 hours as needed.  Anemia-  Has been taking iron and Vit C to help with iron absorption. Feels less tired. Did have colonoscopy in 05/2015- 5 year recall due to Polyps [facility] Plan). Lab Results  Component Value Date   WBC 5.6 05/12/2015   HGB 11.4* 05/12/2015   HCT 34.7* 05/12/2015   MCV 87.6 05/12/2015   PLT 189.0 05/12/2015    Patient Active Problem List   Diagnosis Date Noted  . Visit for well man health check 02/09/2016  . Generalized anxiety disorder 02/09/2016  . Constipation 01/08/2015  . Other nonspecific abnormal finding of lung field 09/02/2013  . Chronic pain syndrome 09/02/2013  . Vitamin D deficiency 09/03/2009  . CAD, ARTERY BYPASS GRAFT 05/20/2009  . Diabetes mellitus type 2, diet-controlled ([place]) 12/11/2006  . HLD (hyperlipidemia) 12/11/2006  . Iron deficiency anemia 12/11/2006  . TOBACCO ABUSE 12/11/2006  . Essential hypertension 12/11/2006  . MYOCARDIAL INFARCTION, HX OF 12/11/2006  . CARDIOMYOPATHY, ISCHEMIC 12/11/2006  . PULMONARY  EMBOLISM, HX OF 12/11/2006   Past Medical History  Diagnosis Date  . Diabetes mellitus   . Hyperlipidemia   . Hypertension   . Low back pain   . MI (myocardial infarction) ([place])   . Arthritis   . Tobacco abuse   . Left ventricular dysfunction   . Obesity   . CAD (coronary artery disease)   . PE (pulmonary embolism)   . Anemia    Past Surgical History  Procedure Laterality Date  . Coronary stent placement      3 all in right side  . Gastric bypass    . Coronary artery bypass graft    . Cholecystectomy    . Hernia repair     Social History  Substance Use Topics  . Smoking status: Current Every Day Smoker -- 1.00 packs/day for 40 years    Types: Cigarettes  . Smokeless tobacco: Former Systems developer  . Alcohol Use: No   Family History  Problem Relation Age of Onset  . Emphysema Mother   . Heart attack Father   . Heart disease Daughter   . Heart disease Brother   . Arthritis Brother   . Hemophilia Brother   . Colon cancer Neg Hx    Allergies  Allergen Reactions  . Codeine     REACTION: makes me "looney"   Current Outpatient Prescriptions on File Prior to Visit  Medication Sig Dispense Refill  . aspirin 81 MG tablet Take 81  mg by mouth daily.      . Calcium Citrate-Vitamin D 500-400 MG-UNIT CHEW Chew 1 tablet by mouth 2 (two) times daily.      . Cholecalciferol (VITAMIN D) 2000 UNITS tablet Take 5,000 Units by mouth daily.     . cyanocobalamin 500 MCG tablet Take 2,500 mcg by mouth daily.    Marland Kitchen docusate sodium (COLACE) 100 MG capsule Take 100 mg by mouth as needed.      . fexofenadine (ALLEGRA) 180 MG tablet Take 180 mg by mouth daily as needed.    . metoprolol succinate (TOPROL-XL) 25 MG 24 hr tablet TAKE 1 TABLET BY MOUTH TWICE DAILY 60 tablet 11  . Multiple Vitamin (MULTIVITAMIN) tablet Take 1 tablet by mouth daily.      . nitroGLYCERIN (NITROSTAT) 0.3 MG SL tablet Place 1 tablet (0.3 mg total) under the tongue every 5 (five) minutes as needed. 90 tablet 3  . omeprazole  (PRILOSEC) 20 MG capsule Take 1 capsule (20 mg total) by mouth 2 (two) times daily before a meal. 30 capsule 3  . simvastatin (ZOCOR) 40 MG tablet TAKE 1 TABLET BY MOUTH AT BEDTIME FOR CHOLESTEROL 90 tablet 0   No current facility-administered medications on file prior to visit.   The PMH, PSH, Social History, Family History, Medications, and allergies have been reviewed in [facility], and have been updated if relevant.   Review of Systems  Review of Systems  Constitutional: Negative for fatigue and unexpected weight change.  HENT: Negative.   Eyes: Negative.   Respiratory: Negative.   Cardiovascular: Negative.   Gastrointestinal: Negative for nausea, vomiting, abdominal pain, diarrhea, abdominal distention, anal bleeding and rectal pain.  Endocrine: Negative.   Genitourinary: Negative.   Musculoskeletal: Negative.   Skin: Negative.   Allergic/Immunologic: Negative.   Hematological: Negative.   Psychiatric/Behavioral: Negative.   All other systems reviewed and are negative.     Physical Exam BP 110/60 mmHg  Pulse 60  Temp(Src) 98.1 F (36.7 C) (Oral)  Wt 185 lb 8 oz (84.142 kg)  SpO2 98% Wt Readings from Last 3 Encounters:  02/09/16 185 lb 8 oz (84.142 kg)  11/12/15 196 lb 12 oz (89.245 kg)  10/14/15 191 lb (86.637 kg)   Physical Exam  Constitutional: He is oriented to person, place, and time and well-developed, well-nourished, and in no distress. No distress.  HENT:  Head: Normocephalic and atraumatic.  Eyes: Conjunctivae are normal.  Neck: Normal range of motion.  Cardiovascular: Normal rate and normal heart sounds.   Pulmonary/Chest: Effort normal and breath sounds normal. No respiratory distress.  Abdominal: Soft.  Musculoskeletal: Normal range of motion. He exhibits no edema.  Neurological: He is alert and oriented to person, place, and time.  Skin: Skin is warm and dry. He is not diaphoretic.  Psychiatric: Mood, memory, affect and judgment normal.  Nursing note and  vitals reviewed.

## 2016-02-09 NOTE — Patient Instructions (Signed)
Great to see you.  We will call you with your results. 

## 2016-02-09 NOTE — Telephone Encounter (Signed)
Rx given to pt at 6/27 OV

## 2016-02-09 NOTE — Assessment & Plan Note (Signed)
Diet control. No changes made today.

## 2016-02-09 NOTE — Assessment & Plan Note (Signed)
Compliant with pain contract. Rx printed and given to pt today.

## 2016-02-10 ENCOUNTER — Ambulatory Visit: Payer: Medicare HMO | Admitting: Family Medicine

## 2016-02-10 ENCOUNTER — Encounter: Payer: Self-pay | Admitting: *Deleted

## 2016-03-08 ENCOUNTER — Other Ambulatory Visit: Payer: Self-pay | Admitting: *Deleted

## 2016-03-08 MED ORDER — ALPRAZOLAM 1 MG PO TABS: ORAL_TABLET | ORAL | 0 refills | Status: DC

## 2016-03-08 NOTE — Telephone Encounter (Signed)
Last f/u 01/2016-CPE 

## 2016-03-08 NOTE — Telephone Encounter (Signed)
Rx called in to requested pharmacy 

## 2016-03-09 ENCOUNTER — Other Ambulatory Visit: Payer: Self-pay

## 2016-03-09 MED ORDER — HYDROCODONE-ACETAMINOPHEN 10-325 MG PO TABS: 1.0000 | ORAL_TABLET | ORAL | 0 refills | Status: DC | PRN

## 2016-03-09 NOTE — Telephone Encounter (Signed)
Pt left v/m requesting rx hydrocodone apap. Call when ready for pick up. Pt last seen annual and rx last printed # 150 on 02/09/16.

## 2016-03-09 NOTE — Telephone Encounter (Signed)
Spoke to pt and informed him Rx is available for pickup from the front desk 

## 2016-03-23 ENCOUNTER — Other Ambulatory Visit: Payer: Self-pay | Admitting: Family Medicine

## 2016-04-11 ENCOUNTER — Other Ambulatory Visit: Payer: Self-pay

## 2016-04-11 MED ORDER — HYDROCODONE-ACETAMINOPHEN 10-325 MG PO TABS: 1.0000 | ORAL_TABLET | ORAL | 0 refills | Status: DC | PRN

## 2016-04-11 NOTE — Telephone Encounter (Signed)
Spoke to pt and informed him Rx is available for pickup from the front desk 

## 2016-04-11 NOTE — Telephone Encounter (Signed)
Pt left v/m requesting rx hydrocodone apap. Call when ready for pick up. Last printed # 150 on 03/09/16; pt last seen 02/09/16.

## 2016-04-13 ENCOUNTER — Other Ambulatory Visit: Payer: Self-pay | Admitting: *Deleted

## 2016-04-13 MED ORDER — ALPRAZOLAM 1 MG PO TABS: ORAL_TABLET | ORAL | 0 refills | Status: DC

## 2016-04-13 NOTE — Telephone Encounter (Signed)
Last f/u 01/2016-CPE 

## 2016-04-13 NOTE — Telephone Encounter (Signed)
Rx called in to requested pharmacy 
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
## 2016-05-16 ENCOUNTER — Other Ambulatory Visit: Payer: Self-pay

## 2016-05-16 MED ORDER — ALPRAZOLAM 1 MG PO TABS: ORAL_TABLET | ORAL | 0 refills | Status: DC

## 2016-05-16 MED ORDER — HYDROCODONE-ACETAMINOPHEN 10-325 MG PO TABS: 1.0000 | ORAL_TABLET | ORAL | 0 refills | Status: DC | PRN

## 2016-05-16 NOTE — Telephone Encounter (Signed)
Spoke to pt and informed him Rx is available for pick from the front desk

## 2016-05-16 NOTE — Telephone Encounter (Signed)
Xanax was called in to [facility].

## 2016-05-16 NOTE — Addendum Note (Signed)
Addended by: Modena Nunnery on: 05/16/2016 12:25 PM   Modules accepted: Orders

## 2016-05-16 NOTE — Telephone Encounter (Signed)
Xanax last filled on 04/13/16 #90, norco filled on 04/11/16 #150. Last OV 02/09/16. Ok to refill?

## 2016-06-03 ENCOUNTER — Other Ambulatory Visit: Payer: Self-pay | Admitting: Family Medicine

## 2016-06-13 ENCOUNTER — Telehealth: Payer: Self-pay | Admitting: Family Medicine

## 2016-06-13 MED ORDER — ALPRAZOLAM 1 MG PO TABS: ORAL_TABLET | ORAL | 0 refills | Status: DC

## 2016-06-13 MED ORDER — HYDROCODONE-ACETAMINOPHEN 10-325 MG PO TABS: 1.0000 | ORAL_TABLET | ORAL | 0 refills | Status: DC | PRN

## 2016-06-13 NOTE — Telephone Encounter (Signed)
Last f/u 01/2016-CPE 

## 2016-06-13 NOTE — Telephone Encounter (Signed)
Pt called in to request refills for his hydrocodone and alprazolam to [facility].  Please call him when this is called in

## 2016-06-15 MED ORDER — HYDROCODONE-ACETAMINOPHEN 10-325 MG PO TABS: 1.0000 | ORAL_TABLET | ORAL | 0 refills | Status: DC | PRN

## 2016-06-15 MED ORDER — ALPRAZOLAM 1 MG PO TABS: ORAL_TABLET | ORAL | 0 refills | Status: DC

## 2016-06-15 NOTE — Addendum Note (Signed)
Addended by: Modena Nunnery on: 06/15/2016 09:58 AM   Modules accepted: Orders

## 2016-06-15 NOTE — Telephone Encounter (Signed)
Unable to locate printed Rx. Re-printed

## 2016-06-15 NOTE — Telephone Encounter (Signed)
Spoke to pt and informed him Rx is available for pickup from the front desk 

## 2016-06-15 NOTE — Telephone Encounter (Signed)
Pt called checking on rx Best number to call [phone]  Please let pt know when ready for pick up

## 2016-07-13 ENCOUNTER — Other Ambulatory Visit: Payer: Self-pay

## 2016-07-13 MED ORDER — ALPRAZOLAM 1 MG PO TABS: ORAL_TABLET | ORAL | 0 refills | Status: DC

## 2016-07-13 MED ORDER — HYDROCODONE-ACETAMINOPHEN 10-325 MG PO TABS: 1.0000 | ORAL_TABLET | ORAL | 0 refills | Status: DC | PRN

## 2016-07-13 NOTE — Telephone Encounter (Signed)
Pt left v/m requesting rx hydrocodone apap (last printed # 150 on 06/15/16) and xanax (last printed #90 on 06/15/16). Call when ready for pick up. Last seen 02/09/16. There was another rx for each med printed on 06/13/16 but per 06/13/16 phone note rx could not be found and another rx printed. Dr Deborra Medina out of office until 07/18/16.

## 2016-07-13 NOTE — Telephone Encounter (Signed)
RX printed and signed 

## 2016-07-13 NOTE — Telephone Encounter (Signed)
Lm on pts vm and informed him Rx is available for pickup from the front desk 

## 2016-07-14 ENCOUNTER — Encounter: Payer: Self-pay | Admitting: Family Medicine

## 2016-07-15 ENCOUNTER — Encounter: Payer: Self-pay | Admitting: Family Medicine

## 2016-07-15 DIAGNOSIS — Z79891 Long term (current) use of opiate analgesic: Secondary | ICD-10-CM | POA: Diagnosis not present

## 2016-07-15 DIAGNOSIS — Z79899 Other long term (current) drug therapy: Secondary | ICD-10-CM | POA: Diagnosis not present

## 2016-08-11 ENCOUNTER — Other Ambulatory Visit: Payer: Self-pay | Admitting: Internal Medicine

## 2016-08-11 MED ORDER — HYDROCODONE-ACETAMINOPHEN 10-325 MG PO TABS: 1.0000 | ORAL_TABLET | ORAL | 0 refills | Status: DC | PRN

## 2016-08-11 MED ORDER — ALPRAZOLAM 1 MG PO TABS: ORAL_TABLET | ORAL | 0 refills | Status: DC

## 2016-08-11 NOTE — Telephone Encounter (Signed)
Dr Deborra Medina approved but is out of office until the new year. Ok to print per [employer]. Spoke to pt and informed him Rx is available for pickup from the front desk.

## 2016-08-11 NOTE — Addendum Note (Signed)
Addended by: Modena Nunnery on: 08/11/2016 10:17 AM   Modules accepted: Orders

## 2016-08-11 NOTE — Telephone Encounter (Signed)
Last f/u 01/2016-CPE 

## 2016-09-14 ENCOUNTER — Other Ambulatory Visit: Payer: Self-pay | Admitting: Internal Medicine

## 2016-09-14 MED ORDER — HYDROCODONE-ACETAMINOPHEN 10-325 MG PO TABS: 1.0000 | ORAL_TABLET | ORAL | 0 refills | Status: DC | PRN

## 2016-09-14 MED ORDER — ALPRAZOLAM 1 MG PO TABS: ORAL_TABLET | ORAL | 0 refills | Status: DC

## 2016-09-14 NOTE — Telephone Encounter (Signed)
Last f/u 01/2016 

## 2016-09-14 NOTE — Telephone Encounter (Signed)
Spoke to pt and informed him Rx is available for pickup from the front desk 

## 2016-09-27 ENCOUNTER — Inpatient Hospital Stay (HOSPITAL_COMMUNITY)
Admission: EM | Admit: 2016-09-27 | Discharge: 2016-09-30 | DRG: 023 | Disposition: A | Discharge status: Home / Self Care | Payer: Medicare HMO | Attending: Neurology | Admitting: Neurology

## 2016-09-27 ENCOUNTER — Inpatient Hospital Stay (HOSPITAL_COMMUNITY): Payer: Medicare HMO

## 2016-09-27 ENCOUNTER — Encounter (HOSPITAL_COMMUNITY)
Admission: EM | Disposition: A | Discharge status: Home / Self Care | Payer: Self-pay | Source: Home / Self Care | Attending: Neurology

## 2016-09-27 ENCOUNTER — Emergency Department (HOSPITAL_COMMUNITY): Payer: Medicare HMO

## 2016-09-27 ENCOUNTER — Emergency Department (HOSPITAL_COMMUNITY): Payer: Medicare HMO | Admitting: Anesthesiology

## 2016-09-27 ENCOUNTER — Encounter (HOSPITAL_COMMUNITY): Payer: Self-pay | Admitting: Neurosurgery

## 2016-09-27 DIAGNOSIS — R001 Bradycardia, unspecified: Secondary | ICD-10-CM | POA: Diagnosis present

## 2016-09-27 DIAGNOSIS — Z885 Allergy status to narcotic agent status: Secondary | ICD-10-CM

## 2016-09-27 DIAGNOSIS — Z86711 Personal history of pulmonary embolism: Secondary | ICD-10-CM | POA: Diagnosis not present

## 2016-09-27 DIAGNOSIS — G8194 Hemiplegia, unspecified affecting left nondominant side: Secondary | ICD-10-CM | POA: Diagnosis not present

## 2016-09-27 DIAGNOSIS — I6522 Occlusion and stenosis of left carotid artery: Secondary | ICD-10-CM | POA: Diagnosis present

## 2016-09-27 DIAGNOSIS — I63412 Cerebral infarction due to embolism of left middle cerebral artery: Secondary | ICD-10-CM | POA: Diagnosis not present

## 2016-09-27 DIAGNOSIS — I6521 Occlusion and stenosis of right carotid artery: Secondary | ICD-10-CM | POA: Diagnosis not present

## 2016-09-27 DIAGNOSIS — Z9049 Acquired absence of other specified parts of digestive tract: Secondary | ICD-10-CM | POA: Diagnosis not present

## 2016-09-27 DIAGNOSIS — I509 Heart failure, unspecified: Secondary | ICD-10-CM | POA: Diagnosis present

## 2016-09-27 DIAGNOSIS — I25709 Atherosclerosis of coronary artery bypass graft(s), unspecified, with unspecified angina pectoris: Secondary | ICD-10-CM | POA: Diagnosis not present

## 2016-09-27 DIAGNOSIS — I6789 Other cerebrovascular disease: Secondary | ICD-10-CM | POA: Diagnosis not present

## 2016-09-27 DIAGNOSIS — Z7982 Long term (current) use of aspirin: Secondary | ICD-10-CM

## 2016-09-27 DIAGNOSIS — I6300 Cerebral infarction due to thrombosis of unspecified precerebral artery: Secondary | ICD-10-CM

## 2016-09-27 DIAGNOSIS — I82432 Acute embolism and thrombosis of left popliteal vein: Secondary | ICD-10-CM | POA: Diagnosis present

## 2016-09-27 DIAGNOSIS — Z6826 Body mass index (BMI) 26.0-26.9, adult: Secondary | ICD-10-CM

## 2016-09-27 DIAGNOSIS — I2510 Atherosclerotic heart disease of native coronary artery without angina pectoris: Secondary | ICD-10-CM | POA: Diagnosis present

## 2016-09-27 DIAGNOSIS — Z86718 Personal history of other venous thrombosis and embolism: Secondary | ICD-10-CM | POA: Diagnosis not present

## 2016-09-27 DIAGNOSIS — R1310 Dysphagia, unspecified: Secondary | ICD-10-CM | POA: Diagnosis present

## 2016-09-27 DIAGNOSIS — I63319 Cerebral infarction due to thrombosis of unspecified middle cerebral artery: Secondary | ICD-10-CM | POA: Diagnosis not present

## 2016-09-27 DIAGNOSIS — I6523 Occlusion and stenosis of bilateral carotid arteries: Secondary | ICD-10-CM | POA: Diagnosis not present

## 2016-09-27 DIAGNOSIS — Z9911 Dependence on respirator [ventilator] status: Secondary | ICD-10-CM | POA: Diagnosis not present

## 2016-09-27 DIAGNOSIS — F17210 Nicotine dependence, cigarettes, uncomplicated: Secondary | ICD-10-CM | POA: Diagnosis present

## 2016-09-27 DIAGNOSIS — I10 Essential (primary) hypertension: Secondary | ICD-10-CM | POA: Diagnosis not present

## 2016-09-27 DIAGNOSIS — I2699 Other pulmonary embolism without acute cor pulmonale: Secondary | ICD-10-CM | POA: Diagnosis present

## 2016-09-27 DIAGNOSIS — E669 Obesity, unspecified: Secondary | ICD-10-CM | POA: Diagnosis present

## 2016-09-27 DIAGNOSIS — I82402 Acute embolism and thrombosis of unspecified deep veins of left lower extremity: Secondary | ICD-10-CM | POA: Diagnosis not present

## 2016-09-27 DIAGNOSIS — J9601 Acute respiratory failure with hypoxia: Secondary | ICD-10-CM | POA: Diagnosis not present

## 2016-09-27 DIAGNOSIS — J9690 Respiratory failure, unspecified, unspecified whether with hypoxia or hypercapnia: Secondary | ICD-10-CM | POA: Diagnosis not present

## 2016-09-27 DIAGNOSIS — Z955 Presence of coronary angioplasty implant and graft: Secondary | ICD-10-CM

## 2016-09-27 DIAGNOSIS — I63411 Cerebral infarction due to embolism of right middle cerebral artery: Secondary | ICD-10-CM | POA: Diagnosis not present

## 2016-09-27 DIAGNOSIS — G9389 Other specified disorders of brain: Secondary | ICD-10-CM | POA: Diagnosis not present

## 2016-09-27 DIAGNOSIS — I82A12 Acute embolism and thrombosis of left axillary vein: Secondary | ICD-10-CM | POA: Diagnosis not present

## 2016-09-27 DIAGNOSIS — Z951 Presence of aortocoronary bypass graft: Secondary | ICD-10-CM | POA: Diagnosis not present

## 2016-09-27 DIAGNOSIS — J9600 Acute respiratory failure, unspecified whether with hypoxia or hypercapnia: Secondary | ICD-10-CM | POA: Diagnosis not present

## 2016-09-27 DIAGNOSIS — E785 Hyperlipidemia, unspecified: Secondary | ICD-10-CM | POA: Diagnosis not present

## 2016-09-27 DIAGNOSIS — G9340 Encephalopathy, unspecified: Secondary | ICD-10-CM | POA: Diagnosis present

## 2016-09-27 DIAGNOSIS — F17200 Nicotine dependence, unspecified, uncomplicated: Secondary | ICD-10-CM | POA: Diagnosis not present

## 2016-09-27 DIAGNOSIS — Z9884 Bariatric surgery status: Secondary | ICD-10-CM

## 2016-09-27 DIAGNOSIS — E119 Type 2 diabetes mellitus without complications: Secondary | ICD-10-CM

## 2016-09-27 DIAGNOSIS — R29713 NIHSS score 13: Secondary | ICD-10-CM | POA: Diagnosis present

## 2016-09-27 DIAGNOSIS — I6601 Occlusion and stenosis of right middle cerebral artery: Secondary | ICD-10-CM | POA: Diagnosis not present

## 2016-09-27 DIAGNOSIS — F1210 Cannabis abuse, uncomplicated: Secondary | ICD-10-CM | POA: Diagnosis present

## 2016-09-27 DIAGNOSIS — Z8249 Family history of ischemic heart disease and other diseases of the circulatory system: Secondary | ICD-10-CM

## 2016-09-27 DIAGNOSIS — Z7901 Long term (current) use of anticoagulants: Secondary | ICD-10-CM | POA: Diagnosis not present

## 2016-09-27 DIAGNOSIS — Z79899 Other long term (current) drug therapy: Secondary | ICD-10-CM

## 2016-09-27 DIAGNOSIS — R471 Dysarthria and anarthria: Secondary | ICD-10-CM | POA: Diagnosis present

## 2016-09-27 DIAGNOSIS — I639 Cerebral infarction, unspecified: Secondary | ICD-10-CM | POA: Diagnosis not present

## 2016-09-27 DIAGNOSIS — R69 Illness, unspecified: Secondary | ICD-10-CM | POA: Diagnosis not present

## 2016-09-27 DIAGNOSIS — I252 Old myocardial infarction: Secondary | ICD-10-CM

## 2016-09-27 DIAGNOSIS — I25700 Atherosclerosis of coronary artery bypass graft(s), unspecified, with unstable angina pectoris: Secondary | ICD-10-CM | POA: Diagnosis not present

## 2016-09-27 DIAGNOSIS — Z978 Presence of other specified devices: Secondary | ICD-10-CM

## 2016-09-27 DIAGNOSIS — I669 Occlusion and stenosis of unspecified cerebral artery: Secondary | ICD-10-CM | POA: Diagnosis not present

## 2016-09-27 DIAGNOSIS — I25810 Atherosclerosis of coronary artery bypass graft(s) without angina pectoris: Secondary | ICD-10-CM | POA: Diagnosis present

## 2016-09-27 DIAGNOSIS — E1159 Type 2 diabetes mellitus with other circulatory complications: Secondary | ICD-10-CM | POA: Diagnosis not present

## 2016-09-27 DIAGNOSIS — R29818 Other symptoms and signs involving the nervous system: Secondary | ICD-10-CM | POA: Diagnosis not present

## 2016-09-27 DIAGNOSIS — R29810 Facial weakness: Secondary | ICD-10-CM | POA: Diagnosis present

## 2016-09-27 DIAGNOSIS — Z4682 Encounter for fitting and adjustment of non-vascular catheter: Secondary | ICD-10-CM | POA: Diagnosis not present

## 2016-09-27 DIAGNOSIS — F1010 Alcohol abuse, uncomplicated: Secondary | ICD-10-CM | POA: Diagnosis not present

## 2016-09-27 DIAGNOSIS — I255 Ischemic cardiomyopathy: Secondary | ICD-10-CM | POA: Diagnosis not present

## 2016-09-27 HISTORY — PX: RADIOLOGY WITH ANESTHESIA: SHX6223

## 2016-09-27 HISTORY — PX: IR GENERIC HISTORICAL: IMG1180011

## 2016-09-27 LAB — CBC
HEMATOCRIT: 41.2 % (ref 39.0–52.0)
HEMOGLOBIN: 13.7 g/dL (ref 13.0–17.0)
MCH: 32.3 pg (ref 26.0–34.0)
MCHC: 33.3 g/dL (ref 30.0–36.0)
MCV: 97.2 fL (ref 78.0–100.0)
Platelets: 140 10*3/uL — ABNORMAL LOW (ref 150–400)
RBC: 4.24 MIL/uL (ref 4.22–5.81)
RDW: 14.1 % (ref 11.5–15.5)
WBC: 5.7 10*3/uL (ref 4.0–10.5)

## 2016-09-27 LAB — I-STAT CHEM 8, ED
BUN: 18 mg/dL (ref 6–20)
CHLORIDE: 107 mmol/L (ref 101–111)
CREATININE: 1.2 mg/dL (ref 0.61–1.24)
Calcium, Ion: 1.02 mmol/L — ABNORMAL LOW (ref 1.15–1.40)
Glucose, Bld: 85 mg/dL (ref 65–99)
HEMATOCRIT: 37 % — AB (ref 39.0–52.0)
Hemoglobin: 12.6 g/dL — ABNORMAL LOW (ref 13.0–17.0)
POTASSIUM: 4.3 mmol/L (ref 3.5–5.1)
Sodium: 142 mmol/L (ref 135–145)
TCO2: 25 mmol/L (ref 0–100)

## 2016-09-27 LAB — DIFFERENTIAL
Basophils Absolute: 0 10*3/uL (ref 0.0–0.1)
Basophils Relative: 1 %
EOS PCT: 4 %
Eosinophils Absolute: 0.2 10*3/uL (ref 0.0–0.7)
LYMPHS ABS: 1.5 10*3/uL (ref 0.7–4.0)
Lymphocytes Relative: 27 %
MONO ABS: 0.5 10*3/uL (ref 0.1–1.0)
Monocytes Relative: 9 %
NEUTROS ABS: 3.4 10*3/uL (ref 1.7–7.7)
Neutrophils Relative %: 61 %

## 2016-09-27 LAB — PROTIME-INR
INR: 1.1
Prothrombin Time: 14.2 seconds (ref 11.4–15.2)

## 2016-09-27 LAB — BASIC METABOLIC PANEL
Anion gap: 6 (ref 5–15)
BUN: 14 mg/dL (ref 6–20)
CHLORIDE: 111 mmol/L (ref 101–111)
CO2: 23 mmol/L (ref 22–32)
Calcium: 8.1 mg/dL — ABNORMAL LOW (ref 8.9–10.3)
Creatinine, Ser: 1.1 mg/dL (ref 0.61–1.24)
GFR calc non Af Amer: >60 mL/min (ref >60)
Glucose, Bld: 91 mg/dL (ref 65–99)
POTASSIUM: 4.5 mmol/L (ref 3.5–5.1)
Sodium: 140 mmol/L (ref 135–145)

## 2016-09-27 LAB — POCT I-STAT 3, ART BLOOD GAS (G3+)
Acid-base deficit: 3 mmol/L — ABNORMAL HIGH (ref 0.0–2.0)
Bicarbonate: 22.9 mmol/L (ref 20.0–28.0)
O2 Saturation: 100 %
PCO2 ART: 45.4 mmHg (ref 32.0–48.0)
PH ART: 7.312 — AB (ref 7.350–7.450)
Patient temperature: 98.6
TCO2: 24 mmol/L (ref 0–100)
pO2, Arterial: 279 mmHg — ABNORMAL HIGH (ref 83.0–108.0)

## 2016-09-27 LAB — TROPONIN I: Troponin I: <0.03 ng/mL (ref <0.03)

## 2016-09-27 LAB — I-STAT TROPONIN, ED: Troponin i, poc: 0 ng/mL (ref 0.00–0.08)

## 2016-09-27 LAB — MAGNESIUM: MAGNESIUM: 1.7 mg/dL (ref 1.7–2.4)

## 2016-09-27 LAB — MRSA PCR SCREENING: MRSA BY PCR: NEGATIVE

## 2016-09-27 LAB — PHOSPHORUS: Phosphorus: 4.1 mg/dL (ref 2.5–4.6)

## 2016-09-27 LAB — APTT: APTT: 29 s (ref 24–36)

## 2016-09-27 SURGERY — RADIOLOGY WITH ANESTHESIA
Anesthesia: General

## 2016-09-27 MED ORDER — ROCURONIUM BROMIDE 100 MG/10ML IV SOLN
INTRAVENOUS | Status: DC | PRN
  Administered 2016-09-27: 20 mg via INTRAVENOUS
  Administered 2016-09-27 (×2): 30 mg via INTRAVENOUS
  Administered 2016-09-27: 20 mg via INTRAVENOUS

## 2016-09-27 MED ORDER — ALPRAZOLAM 0.5 MG PO TABS: 1.0000 mg | ORAL_TABLET | Freq: Three times a day (TID) | ORAL | Status: DC | PRN

## 2016-09-27 MED ORDER — ACETAMINOPHEN 325 MG PO TABS: 650.0000 mg | ORAL_TABLET | ORAL | Status: DC | PRN

## 2016-09-27 MED ORDER — SODIUM CHLORIDE 0.9 % IV SOLN
INTRAVENOUS | Status: DC | PRN
  Administered 2016-09-27: 13:00:00 via INTRAVENOUS

## 2016-09-27 MED ORDER — PROPOFOL 1000 MG/100ML IV EMUL
5.0000 ug/kg/min | INTRAVENOUS | Status: DC
  Administered 2016-09-27: 15 ug/kg/min via INTRAVENOUS

## 2016-09-27 MED ORDER — NITROGLYCERIN 1 MG/10 ML FOR IR/CATH LAB
INTRA_ARTERIAL | Status: AC
  Filled 2016-09-27: qty 10

## 2016-09-27 MED ORDER — FENTANYL CITRATE (PF) 100 MCG/2ML IJ SOLN
INTRAMUSCULAR | Status: DC | PRN
  Administered 2016-09-27: 100 ug via INTRAVENOUS

## 2016-09-27 MED ORDER — EPTIFIBATIDE 20 MG/10ML IV SOLN
INTRAVENOUS | Status: AC
  Filled 2016-09-27: qty 10

## 2016-09-27 MED ORDER — IOPAMIDOL (ISOVUE-300) INJECTION 61%
150.0000 mL | Freq: Once | INTRAVENOUS | Status: AC | PRN
  Administered 2016-09-27: 100 mL via INTRA_ARTERIAL

## 2016-09-27 MED ORDER — CEFAZOLIN SODIUM-DEXTROSE 2-3 GM-% IV SOLR
INTRAVENOUS | Status: DC | PRN
  Administered 2016-09-27: 2 g via INTRAVENOUS

## 2016-09-27 MED ORDER — SODIUM CHLORIDE 0.9 % IV SOLN
INTRAVENOUS | Status: DC
  Administered 2016-09-27: 19:00:00 via INTRAVENOUS

## 2016-09-27 MED ORDER — ACETAMINOPHEN 650 MG RE SUPP: 650.0000 mg | RECTAL | Status: DC | PRN

## 2016-09-27 MED ORDER — NICARDIPINE HCL IN NACL 20-0.86 MG/200ML-% IV SOLN
0.0000 mg/h | INTRAVENOUS | Status: DC
  Administered 2016-09-27: 5 mg/h via INTRAVENOUS
  Filled 2016-09-27: qty 200

## 2016-09-27 MED ORDER — GLYCOPYRROLATE 0.2 MG/ML IJ SOLN
INTRAMUSCULAR | Status: DC | PRN
  Administered 2016-09-27: .2 mg via INTRAVENOUS
  Administered 2016-09-27 (×3): 0.2 mg via INTRAVENOUS

## 2016-09-27 MED ORDER — IOPAMIDOL (ISOVUE-300) INJECTION 61%
INTRAVENOUS | Status: AC
  Administered 2016-09-27: 50 mL
  Filled 2016-09-27: qty 150

## 2016-09-27 MED ORDER — ONDANSETRON HCL 4 MG/2ML IJ SOLN: 4.0000 mg | Freq: Four times a day (QID) | INTRAMUSCULAR | Status: DC | PRN

## 2016-09-27 MED ORDER — IOPAMIDOL (ISOVUE-370) INJECTION 76%
INTRAVENOUS | Status: AC
  Administered 2016-09-27: 50 mL
  Filled 2016-09-27: qty 50

## 2016-09-27 MED ORDER — ACETAMINOPHEN 160 MG/5ML PO SOLN: 650.0000 mg | ORAL | Status: DC | PRN

## 2016-09-27 MED ORDER — PANTOPRAZOLE SODIUM 40 MG IV SOLR
40.0000 mg | Freq: Every day | INTRAVENOUS | Status: DC
  Administered 2016-09-27: 40 mg via INTRAVENOUS
  Filled 2016-09-27: qty 40

## 2016-09-27 MED ORDER — SODIUM CHLORIDE 0.9 % IV SOLN
1.0000 g | Freq: Once | INTRAVENOUS | Status: AC
  Administered 2016-09-27: 1 g via INTRAVENOUS
  Filled 2016-09-27: qty 10

## 2016-09-27 MED ORDER — IOPAMIDOL (ISOVUE-300) INJECTION 61%
INTRAVENOUS | Status: AC
  Administered 2016-09-27: 40 mL
  Filled 2016-09-27: qty 300

## 2016-09-27 MED ORDER — ALBUMIN HUMAN 5 % IV SOLN
INTRAVENOUS | Status: DC | PRN
  Administered 2016-09-27: 14:00:00 via INTRAVENOUS

## 2016-09-27 MED ORDER — FENTANYL CITRATE (PF) 100 MCG/2ML IJ SOLN: 50.0000 ug | INTRAMUSCULAR | Status: DC | PRN

## 2016-09-27 MED ORDER — SUCCINYLCHOLINE CHLORIDE 20 MG/ML IJ SOLN
INTRAMUSCULAR | Status: DC | PRN
Start: 2016-09-27 — End: 2016-09-27
  Administered 2016-09-27: 120 mg via INTRAVENOUS

## 2016-09-27 MED ORDER — FENTANYL CITRATE (PF) 100 MCG/2ML IJ SOLN
INTRAMUSCULAR | Status: AC
  Filled 2016-09-27: qty 2

## 2016-09-27 MED ORDER — EPHEDRINE SULFATE 50 MG/ML IJ SOLN
INTRAMUSCULAR | Status: DC | PRN
  Administered 2016-09-27 (×2): 5 mg via INTRAVENOUS

## 2016-09-27 MED ORDER — ALTEPLASE (STROKE) FULL DOSE INFUSION
0.9000 mg/kg | Freq: Once | INTRAVENOUS | Status: AC
  Administered 2016-09-27: 76 mg via INTRAVENOUS
  Filled 2016-09-27: qty 100

## 2016-09-27 MED ORDER — NICARDIPINE HCL IN NACL 20-0.86 MG/200ML-% IV SOLN: 0.0000 mg/h | INTRAVENOUS | Status: DC

## 2016-09-27 MED ORDER — PROPOFOL 10 MG/ML IV BOLUS
INTRAVENOUS | Status: DC | PRN
  Administered 2016-09-27: 100 mg via INTRAVENOUS

## 2016-09-27 MED ORDER — EPTIFIBATIDE 20 MG/10ML IV SOLN
INTRAVENOUS | Status: AC | PRN
  Administered 2016-09-27: 1.8 mg

## 2016-09-27 MED ORDER — CEFAZOLIN SODIUM-DEXTROSE 2-4 GM/100ML-% IV SOLN
INTRAVENOUS | Status: AC
  Filled 2016-09-27: qty 100

## 2016-09-27 MED ORDER — STROKE: EARLY STAGES OF RECOVERY BOOK
Freq: Once | Status: AC
Start: 2016-09-27 — End: 2016-09-27
  Administered 2016-09-27: 17:00:00
  Filled 2016-09-27: qty 1

## 2016-09-27 MED ORDER — LABETALOL HCL 5 MG/ML IV SOLN: 20.0000 mg | Freq: Once | INTRAVENOUS | Status: DC

## 2016-09-27 MED ORDER — PROPOFOL 1000 MG/100ML IV EMUL
INTRAVENOUS | Status: AC
  Filled 2016-09-27: qty 100

## 2016-09-27 MED ORDER — PHENYLEPHRINE HCL 10 MG/ML IJ SOLN
INTRAVENOUS | Status: DC | PRN
  Administered 2016-09-27: 10 ug/min via INTRAVENOUS

## 2016-09-27 MED ORDER — SIMVASTATIN 40 MG PO TABS
40.0000 mg | ORAL_TABLET | Freq: Every day | ORAL | Status: DC
  Administered 2016-09-28 – 2016-09-29 (×2): 40 mg via ORAL
  Filled 2016-09-27 (×3): qty 1

## 2016-09-27 NOTE — Procedures (Signed)
Neuro-Interventional Radiology Post Cerebral Angiogram Procedure Note  History:   Baseline mRS:  0 NIHSS:   >8 Last Known Well:  10:45am  ASPECTS:   10 Anesthesia    GETA  Skin Puncture:   1:09pm  First Pass Date & Time: 13:34 (TICI 2B, Solumbra technique with 4x40 Solitaire, and Penumbra aspiration) Last Pass    15:35 (TICI 2B, TRAP technique with 4x30 Trevo, and Penumbra aspiration)    IA Medication:  1.8mg  integrilin Proximal or Distal:  Proximal MCA Post TICI Score:  TICI 2B  Procedure:  -Right CFA access for cerebral angiogram -Cerebral angiogram, including right and left carotid system -Mechanical thrombectomy of Right MCA, with 6 passes, each using local aspiration and stentreiver technology.  -Placement of 27F R CFA sheath for overnight  Findings:  - initial image confirms R MCA occlusion, with only temporal branch patency - Initial pass results in TICI 2B flow, with persisting proximal occlusion of large insular branch - 2nd pass to open the insular and cortical flow resulted in mobilization of the clot proximal, with decreased TICI flow to TICI 1 flow.. - 4 extra passes were successful in re-establishing TICI 2b flow.   Left carotid injection demonstrates no compromise of the left cerebral hemisphere. Severe left CCA bifurcation disease.   Complications: None  Plan: - To CT for non-con CT - Admit to ICU - Will remain intubated - Right CFA 27F sheath will remain overnight, may be pulled for closure tomorrow.  - SBP target of 140-150 - Frequent NV checks - Care per ICU, thank you     Signed,  Dulcy Fanny. Earleen Newport, DO

## 2016-09-27 NOTE — Anesthesia Preprocedure Evaluation (Addendum)
Anesthesia Evaluation  Patient identified by MRN, date of birth, ID band Patient awake    Reviewed: Allergy & Precautions, NPO status , reviewed documented beta blocker date and time   History of Anesthesia Complications Negative for: history of anesthetic complications  Airway Mallampati: II  TM Distance: >3 FB Neck ROM: Full    Dental no notable dental hx. (+) Dental Advisory Given   Pulmonary Current Smoker,    Pulmonary exam normal        Cardiovascular hypertension, + CAD and + Past MI  Normal cardiovascular exam     Neuro/Psych    GI/Hepatic negative GI ROS, Neg liver ROS,   Endo/Other  diabetes  Renal/GU negative Renal ROS     Musculoskeletal   Abdominal   Peds  Hematology   Anesthesia Other Findings   Reproductive/Obstetrics                             Anesthesia Physical Anesthesia Plan  ASA: III and emergent  Anesthesia Plan: General   Post-op Pain Management:    Induction: Intravenous  Airway Management Planned: Oral ETT  Additional Equipment:   Intra-op Plan:   Post-operative Plan: Possible Post-op intubation/ventilation  Informed Consent: I have reviewed the patients History and Physical, chart, labs and discussed the procedure including the risks, benefits and alternatives for the proposed anesthesia with the patient or authorized representative who has indicated his/her understanding and acceptance.   Dental advisory given  Plan Discussed with: CRNA, Anesthesiologist and Surgeon  Anesthesia Plan Comments:         Anesthesia Quick Evaluation

## 2016-09-27 NOTE — Sedation Documentation (Signed)
Marlowe Kays, RN updated. Pt to be transported to CT for CT head.

## 2016-09-27 NOTE — Code Documentation (Addendum)
61 y.o. Male arrives to [facility] via [facility]. Pt was in his normal state of health this morning when around 1045 he went to sit in his recliner and had an acute onset of left sided hemiparesis, left sided facial droop and slurred speech. EMS was notified and code stroke called. Pt with hx of DM, HTN, HLD, and current tobacco use. Upon arrial to [facility], labs were drawn and pt taken to CT. CT positive for hyperdense right MCA suspicious for emergent large vessel occlusion. No acute intracranial hemorrhage or changes of acute cortically based infarct are identified. 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 ASPECTS is 10. On assessment, NIHSS 13. Pt with slight left sided facial droop, left sided hemiplegia, decreased sensation on the left side and mild dysarthria. See EMR for code stroke times and NIHSS. CTA positive for emergent large vessel occlusion in the right MCA M1  Segment. Superimposed right carotid calcified atherosclerosis without hemodynamically significant stenosis. However, there is High-grade (80%, approaching RADIOGRAPHIC STRING SIGN) stenosis at the left ICA origin. Dominant left vertebral artery with up to moderate origin stenosis due to calcified plaque. Non dominant right vertebral artery functionally terminates in PICA. Decision to treat with tPA was made and pt consents to medication. IR notified of possible intervention. Neuro MD and IR MD discussed case and wish to proceed with IR. Pt transported to IR from ED by ED RN and stroke response RN. Bedside handoff with IR team.

## 2016-09-27 NOTE — Anesthesia Postprocedure Evaluation (Signed)
Anesthesia Post Note  Patient: [name]  Procedure(s) Performed: Procedure(s) (LRB): RADIOLOGY WITH ANESTHESIA (N/A)  Patient location during evaluation: ICU Anesthesia Type: General Level of consciousness: patient remains intubated per anesthesia plan Pain management: pain level controlled Vital Signs Assessment: post-procedure vital signs reviewed and stable Respiratory status: patient remains intubated per anesthesia plan Cardiovascular status: stable Postop Assessment: no signs of nausea or vomiting Anesthetic complications: no       Last Vitals:  Vitals:   09/27/16 1236  BP: 138/63  Pulse: (!) 58  Resp: 19    Last Pain: There were no vitals filed for this visit.               Isabellarose Kope,Kaito A.

## 2016-09-27 NOTE — Sedation Documentation (Signed)
Bedside report given to Ander Purpura, RN. Pt silver toned ring given to Lauren, RN in a biohazard bag.

## 2016-09-27 NOTE — Anesthesia Procedure Notes (Signed)
Procedure Name: Intubation Date/Time: 09/27/2016 12:58 PM Performed by: Izora Gala Pre-anesthesia Checklist: Patient identified, Emergency Drugs available, Suction available and Patient being monitored Patient Re-evaluated:Patient Re-evaluated prior to inductionOxygen Delivery Method: Circle system utilized Preoxygenation: Pre-oxygenation with 100% oxygen Intubation Type: IV induction Ventilation: Mask ventilation without difficulty Laryngoscope Size: Miller and 3 Grade View: Grade I Tube type: Subglottic suction tube Number of attempts: 1 Airway Equipment and Method: Stylet Placement Confirmation: ETT inserted through vocal cords under direct vision,  positive ETCO2 and breath sounds checked- equal and bilateral

## 2016-09-27 NOTE — Progress Notes (Signed)
Neuro-Interventional Radiology Pre-procedure Note     HPI: [name] is an 61 y.o. male was last seen normal at 54 today when he attempted to use his left arm to lower the foot rest of his chair when he noticed that he could not move his arm or his leg. His friends also noticed that he had a left facial droop and dysarthria.    Via the ED as Code stroke.   CT head was obtained showing a right dense MCA sign this was confirmed with CT angiogram of head and neck.  Patient was quickly started with TPA and patient signed consent for interventional radiology.  Date last known well: Date: 09/27/2016 Time last known well: Time: 10:45 tPA Given: Yes                                                                                                             6  Modified Rankin: Rankin Score=0  Patient is a candidate for thrombectomy, and neurology contacted Dr. Estanislado Pandy of [facility] for evaluation.    Dr. Kathi Ludwig and Dr. Earleen Newport consented the patient in the IR suite for thrombectomy.   Risks and Benefits discussed with the patient including, but not limited to bleeding, infection, vascular injury, contrast induced renal failure, ~10-15% of intracerebral hemorrhage, need for further surgery/procedure, neurologic deficit, cardiopulmonary collapse, death. All of the patient's questions were answered, patient is agreeable to emergently proceed. Consent signed and in chart.  No family immediately available.   Plan: For cerebral angiogram and cerebral thrombectomy.   Signed,  Dulcy Fanny. Earleen Newport, DO

## 2016-09-27 NOTE — H&P (Signed)
History and physical    Chief Complaint: Code stroke with CT showing right dense MCA sign  History obtained from:  Patient     HPI:                                                                                                                                         [name] is an 61 y.o. male with multiple risk factors for stroke including diabetes, smoking, hypertension and hyperlipidemia. Patient was last seen normal at 1045 today when he attempted to use his left arm to lower the foot rest of his chair when he noticed that he could not move his arm or his leg. His friends also noticed that he had a left facial droop and dysarthria. Patient was brought to [facility] as a code stroke. Symptoms persisted. CT of head was obtained showing a right dense MCA sign this was confirmed with CT angiogram of head and neck. Patient was quickly started with TPA and patient signed consent for interventional radiology.  Date last known well: Date: 09/27/2016 Time last known well: Time: 10:45 tPA Given: Yes          6  Modified Rankin: Rankin Score=0   Past Medical History:  Diagnosis Date  . Anemia   . Arthritis   . CAD (coronary artery disease)   . Diabetes mellitus   . Hyperlipidemia   . Hypertension   . Left ventricular dysfunction   . Low back pain   . MI (myocardial infarction)   . Obesity   . PE (pulmonary embolism)   . Tobacco abuse     Past Surgical History:  Procedure Laterality Date  . CHOLECYSTECTOMY    . CORONARY ARTERY BYPASS GRAFT    . CORONARY STENT PLACEMENT     3 all in right side  . GASTRIC BYPASS    . HERNIA REPAIR      Family History  Problem Relation Age of Onset  . Emphysema Mother   . Heart attack Father   . Heart disease Daughter   . Heart disease Brother   . Arthritis Brother   . Hemophilia Brother   . Colon cancer Neg Hx    Social History:  reports that he has been smoking Cigarettes.  He has a 40.00 pack-year smoking history.  He has quit using smokeless tobacco. He reports that he does not drink alcohol or use drugs.  Allergies:  Allergies  Allergen Reactions  . Codeine     REACTION: makes me "looney"    Medications:  Current Facility-Administered Medications  Medication Dose Route Frequency Provider Last Rate Last Dose  . alteplase (ACTIVASE) 1 mg/mL infusion 76 mg  0.9 mg/kg Intravenous Once Doren Custard, MD 76 mL/hr at 09/27/16 1239 76 mg at 09/27/16 1239  . fentaNYL (SUBLIMAZE) 100 MCG/2ML injection           . iopamidol (ISOVUE-300) 61 % injection            Current Outpatient Prescriptions  Medication Sig Dispense Refill  . ALPRAZolam (XANAX) 1 MG tablet TAKE ONE TABLET BY MOUTH THREE TIMES DAILY AS NEEDED FOR ANXIETY 90 tablet 0  . aspirin 81 MG tablet Take 81 mg by mouth daily.      . Calcium Citrate-Vitamin D 500-400 MG-UNIT CHEW Chew 1 tablet by mouth 2 (two) times daily.      . Cholecalciferol (VITAMIN D) 2000 UNITS tablet Take 5,000 Units by mouth daily.     . cyanocobalamin 500 MCG tablet Take 2,500 mcg by mouth daily.    Marland Kitchen docusate sodium (COLACE) 100 MG capsule Take 100 mg by mouth as needed.      . fexofenadine (ALLEGRA) 180 MG tablet Take 180 mg by mouth daily as needed.    Marland Kitchen HYDROcodone-acetaminophen (NORCO) 10-325 MG tablet Take 1 tablet by mouth every 4 (four) hours as needed for severe pain. 150 tablet 0  . metoprolol succinate (TOPROL-XL) 25 MG 24 hr tablet TAKE 1 TABLET BY MOUTH TWICE DAILY 180 tablet 1  . Multiple Vitamin (MULTIVITAMIN) tablet Take 1 tablet by mouth daily.      . nitroGLYCERIN (NITROSTAT) 0.3 MG SL tablet Place 1 tablet (0.3 mg total) under the tongue every 5 (five) minutes as needed. 90 tablet 3  . omeprazole (PRILOSEC) 20 MG capsule Take 1 capsule (20 mg total) by mouth 2 (two) times daily before a meal. 30 capsule 3  . simvastatin (ZOCOR) 40 MG  tablet TAKE 1 TABLET BY MOUTH AT BEDTIME FOR CHOLESTEROL 90 tablet 0     ROS:                                                                                                                                       History obtained from the patient  General ROS: negative for - chills, fatigue, fever, night sweats, weight gain or weight loss Psychological ROS: negative for - behavioral disorder, hallucinations, memory difficulties, mood swings or suicidal ideation Ophthalmic ROS: negative for - blurry vision, double vision, eye pain or loss of vision ENT ROS: negative for - epistaxis, nasal discharge, oral lesions, sore throat, tinnitus or vertigo Allergy and Immunology ROS: negative for - hives or itchy/watery eyes Hematological and Lymphatic ROS: negative for - bleeding problems, bruising or swollen lymph nodes Endocrine ROS: negative for - galactorrhea, hair pattern changes, polydipsia/polyuria or temperature intolerance Respiratory ROS: negative for - cough, hemoptysis, shortness of breath or wheezing Cardiovascular ROS: negative for - chest  pain, dyspnea on exertion, edema or irregular heartbeat Gastrointestinal ROS: negative for - abdominal pain, diarrhea, hematemesis, nausea/vomiting or stool incontinence Genito-Urinary ROS: negative for - dysuria, hematuria, incontinence or urinary frequency/urgency Musculoskeletal ROS: Positive for -muscular weakness Neurological ROS: as noted in HPI Dermatological ROS: negative for rash and skin lesion changes  Neurologic Examination:                                                                                                      Blood pressure 138/63, pulse (!) 58, resp. rate 19, weight 84.3 kg (185 lb 13.6 oz), SpO2 95 %.  HEENT-  Normocephalic, no lesions, without obvious abnormality.  Normal external eye and conjunctiva.  Normal TM's bilaterally.  Normal auditory canals and external ears. Normal external nose, mucus membranes and septum.   Normal pharynx. Cardiovascular- S1, S2 normal, pulses palpable throughout   Lungs- chest clear, no wheezing, rales, normal symmetric air entry, Heart exam - S1, S2 normal, no murmur, no gallop, rate regular Abdomen- normal findings: bowel sounds normal Extremities- less then 2 second capillary refill Lymph-no adenopathy palpable Musculoskeletal-no joint tenderness, deformity or swelling Skin-warm and dry, no hyperpigmentation, vitiligo, or suspicious lesions  Neurological Examination Mental Status: Alert, oriented, thought content appropriate.  Speech dysarthric without evidence of aphasia.  Able to follow 3 step commands without difficulty. Cranial Nerves: II:  Visual fields grossly normal,  III,IV, VI: ptosis in the left eye, extra-ocular motions intact bilaterally, pupils equal, round, reactive to light and accommodation V,VII: smile showing left facial droop, facial light touch sensation decreased on the left VIII: hearing normal bilaterally IX,X: uvula rises symmetrically XI: bilateral shoulder shrug XII: midline tongue extension Motor: Right : Upper extremity   5/5    Left:     Upper extremity   0/5  Lower extremity   5/5     Lower extremity   0/5 Tone and bulk:normal tone throughout; no atrophy noted Sensory: No sensation noted on the left arm and leg Deep Tendon Reflexes: 2+ and symmetric throughout Plantars: Right: downgoing   Left: downgoing Cerebellar: normal finger-to-nose on the right unable to do on the left and normal heel-to-shin test on the right unable to do on the left Gait: Not tested       Lab Results: Basic Metabolic Panel:  Recent Labs Lab 09/27/16 1241  NA 142  K 4.3  CL 107  GLUCOSE 85  BUN 18  CREATININE 1.20    Liver Function Tests: No results for input(s): AST, ALT, ALKPHOS, BILITOT, PROT, ALBUMIN in the last 168 hours. No results for input(s): LIPASE, AMYLASE in the last 168 hours. No results for input(s): AMMONIA in the last 168  hours.  CBC:  Recent Labs Lab 09/27/16 1205 09/27/16 1241  WBC 5.7  --   NEUTROABS 3.4  --   HGB 13.7 12.6*  HCT 41.2 37.0*  MCV 97.2  --   PLT 140*  --     Cardiac Enzymes: No results for input(s): CKTOTAL, CKMB, CKMBINDEX, TROPONINI in the last 168 hours.  Lipid Panel: No results for input(s):  CHOL, TRIG, HDL, CHOLHDL, VLDL, LDLCALC in the last 168 hours.  CBG: No results for input(s): GLUCAP in the last 168 hours.  Microbiology: Results for orders placed or performed in visit on 11/27/14  Fecal occult blood, imunochemical     Status: None   Collection Time: 11/27/14  4:00 PM  Result Value Ref Range Status   Fecal Occult Bld Negative Negative Final    Coagulation Studies:  Recent Labs  09/27/16 1205  LABPROT 14.2  INR 1.10    Imaging: Ct Angio Head W Or Wo Contrast  Result Date: 09/27/2016 CLINICAL DATA:  61 year old male code stroke with suspicion of right MCA occlusion on noncontrast head CT. Initial encounter. EXAM: CT ANGIOGRAPHY HEAD AND NECK TECHNIQUE: Multidetector CT imaging of the head and neck was performed using the standard protocol during bolus administration of intravenous contrast. Multiplanar CT image reconstructions and MIPs were obtained to evaluate the vascular anatomy. Carotid stenosis measurements (when applicable) are obtained utilizing NASCET criteria, using the distal internal carotid diameter as the denominator. CONTRAST:  50 mL Isovue 370 COMPARISON:  Noncontrast head CT 1214 hours today. FINDINGS: CTA NECK Skeleton: Absent maxillary dentition. Lower cervical ACDF hardware. Previous median sternotomy. No acute osseous abnormality identified. Upper chest: Centrilobular and paraseptal emphysema. Previous CABG. No superior mediastinal lymphadenopathy. Other neck: Negative thyroid, larynx, pharynx, parapharyngeal spaces, retropharyngeal space, sublingual space, submandibular glands and parotid glands. No cervical lymphadenopathy. Aortic arch: 3  vessel arch configuration. Mild to moderate mostly distal arch calcified atherosclerosis. Right carotid system: Negative brachiocephalic artery and right CCA origin. No right CCA stenosis. Calcified plaque at the right carotid bifurcation including in the right ICA origin and bulb, but no significant stenosis. Mildly tortuous cervical right ICA. Left carotid system: Negative left CCA origin. Calcified plaque along the anterior left CCA without stenosis proximal to the bifurcation. Bulky calcified plaque at the left carotid bifurcation resulting in high-grade left ICA origin stenosis (numerically estimated at 80 % with respect to the distal vessel) and approaching a RADIOGRAPHIC STRING SIGN. See series 5, image 120 and series 8, image 114. Calcified plaque continues in the left ICA bulb without additional stenosis. Vertebral arteries: No proximal right subclavian artery or right vertebral artery origins stenosis. Negative cervical right vertebral artery. Calcified plaque in the proximal left subclavian artery without stenosis. Bulky calcified plaque at the left vertebral artery origin resulting in mild to moderate origin stenosis (series 8, image 144). The left vertebral artery is mildly dominant, with no additional stenosis to the skullbase. CTA HEAD Posterior circulation: Dominant distal left vertebral artery primarily supplies the basilar. The non dominant distal right vertebral artery is diminutive beyond the right PICA origin. There is mild left V4 segment calcified plaque without distal left vertebral artery stenosis. Mild irregularity at the vertebrobasilar junction does not appear hemodynamically significant. Mildly tortuous basilar artery without stenosis. SCA and PCA origins are patent. Posterior communicating arteries are diminutive or absent. Bilateral PCA branches appear within normal limits. Anterior circulation: Both ICA siphons are patent. There is fairly extensive bilateral siphon calcified plaque,  but this does not appear to be hemodynamically significant. Both carotid termini are patent. MCA and ACA origins are normal. Diminutive or absent anterior communicating artery. Bilateral ACA branches, left MCA M1 segment, and left MCA branches appear within normal limits. The right MCA M1 segment is occluded 9-10 mm beyond its origin. See series 11, image 22 and series 9, image 18. While there is no enhancement at the right MCA bifurcationthere is some  reconstitution of the right MCA branches as seen on series 9, image 16. Venous sinuses: Appear patent. Anatomic variants: Dominant left vertebral artery. Review of the MIP images confirms the above findings IMPRESSION: 1. 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 This was relayed via text pager to [facility] on 09/27/2016 at 1230 hours, and later we discussed by telephone. 2. Superimposed right carotid calcified atherosclerosis without hemodynamically significant stenosis. 3. However, there is High-grade (80%, approaching RADIOGRAPHIC STRING SIGN) stenosis at the left ICA origin. 4. Dominant left vertebral artery with up to moderate origin stenosis due to calcified plaque. Non dominant right vertebral artery functionally terminates in PICA. Electronically Signed   By: Genevie Ann M.D.   On: 09/27/2016 12:46   Ct Angio Neck W Or Wo Contrast  Result Date: 09/27/2016 CLINICAL DATA:  61 year old male code stroke with suspicion of right MCA occlusion on noncontrast head CT. Initial encounter. EXAM: CT ANGIOGRAPHY HEAD AND NECK TECHNIQUE: Multidetector CT imaging of the head and neck was performed using the standard protocol during bolus administration of intravenous contrast. Multiplanar CT image reconstructions and MIPs were obtained to evaluate the vascular anatomy. Carotid stenosis measurements (when applicable) are obtained utilizing NASCET criteria, using the distal internal carotid diameter as the denominator. CONTRAST:  50 mL Isovue  370 COMPARISON:  Noncontrast head CT 1214 hours today. FINDINGS: CTA NECK Skeleton: Absent maxillary dentition. Lower cervical ACDF hardware. Previous median sternotomy. No acute osseous abnormality identified. Upper chest: Centrilobular and paraseptal emphysema. Previous CABG. No superior mediastinal lymphadenopathy. Other neck: Negative thyroid, larynx, pharynx, parapharyngeal spaces, retropharyngeal space, sublingual space, submandibular glands and parotid glands. No cervical lymphadenopathy. Aortic arch: 3 vessel arch configuration. Mild to moderate mostly distal arch calcified atherosclerosis. Right carotid system: Negative brachiocephalic artery and right CCA origin. No right CCA stenosis. Calcified plaque at the right carotid bifurcation including in the right ICA origin and bulb, but no significant stenosis. Mildly tortuous cervical right ICA. Left carotid system: Negative left CCA origin. Calcified plaque along the anterior left CCA without stenosis proximal to the bifurcation. Bulky calcified plaque at the left carotid bifurcation resulting in high-grade left ICA origin stenosis (numerically estimated at 80 % with respect to the distal vessel) and approaching a RADIOGRAPHIC STRING SIGN. See series 5, image 120 and series 8, image 114. Calcified plaque continues in the left ICA bulb without additional stenosis. Vertebral arteries: No proximal right subclavian artery or right vertebral artery origins stenosis. Negative cervical right vertebral artery. Calcified plaque in the proximal left subclavian artery without stenosis. Bulky calcified plaque at the left vertebral artery origin resulting in mild to moderate origin stenosis (series 8, image 144). The left vertebral artery is mildly dominant, with no additional stenosis to the skullbase. CTA HEAD Posterior circulation: Dominant distal left vertebral artery primarily supplies the basilar. The non dominant distal right vertebral artery is diminutive beyond  the right PICA origin. There is mild left V4 segment calcified plaque without distal left vertebral artery stenosis. Mild irregularity at the vertebrobasilar junction does not appear hemodynamically significant. Mildly tortuous basilar artery without stenosis. SCA and PCA origins are patent. Posterior communicating arteries are diminutive or absent. Bilateral PCA branches appear within normal limits. Anterior circulation: Both ICA siphons are patent. There is fairly extensive bilateral siphon calcified plaque, but this does not appear to be hemodynamically significant. Both carotid termini are patent. MCA and ACA origins are normal. Diminutive or absent anterior communicating artery. Bilateral ACA branches, left MCA M1  segment, and left MCA branches appear within normal limits. The right MCA M1 segment is occluded 9-10 mm beyond its origin. See series 11, image 22 and series 9, image 18. While there is no enhancement at the right MCA bifurcationthere is some reconstitution of the right MCA branches as seen on series 9, image 16. Venous sinuses: Appear patent. Anatomic variants: Dominant left vertebral artery. Review of the MIP images confirms the above findings IMPRESSION: 1. 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 This was relayed via text pager to [facility] on 09/27/2016 at 1230 hours, and later we discussed by telephone. 2. Superimposed right carotid calcified atherosclerosis without hemodynamically significant stenosis. 3. However, there is High-grade (80%, approaching RADIOGRAPHIC STRING SIGN) stenosis at the left ICA origin. 4. Dominant left vertebral artery with up to moderate origin stenosis due to calcified plaque. Non dominant right vertebral artery functionally terminates in PICA. Electronically Signed   By: Genevie Ann M.D.   On: 09/27/2016 12:46   Ct Head Code Stroke W/o Cm  Result Date: 09/27/2016 CLINICAL DATA:  Code stroke. 61 year old male with left side weakness  and slurred speech. Initial encounter. EXAM: CT HEAD WITHOUT CONTRAST TECHNIQUE: Contiguous axial images were obtained from the base of the skull through the vertex without intravenous contrast. COMPARISON:  Cervical spine MRI 03/30/2014 FINDINGS: Brain: No acute intracranial hemorrhage identified. No midline shift, mass effect, or evidence of intracranial mass lesion. 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 There is some associated right external capsule hypodensity. No superimposed acute cortically based infarct changes are identified. No other cortical encephalomalacia. Gray-white matter differentiation outside of the area above appears within normal limits. No ventriculomegaly. Vascular: Hyperdense right MCA (series 4, image 27). Skull: No acute osseous abnormality identified. Sinuses/Orbits: Mild mastoid effusion. Adenoid hypertrophy. Paranasal sinuses are clear except for right maxillary mucosal thickening. Other: Mild rightward gaze deviation. Negative orbit and scalp soft tissues otherwise. ASPECTS [facility] Stroke Program Early CT Score) - Ganglionic level infarction (caudate, lentiform nuclei, internal capsule, insula, M1-M3 cortex): 7 - Supraganglionic infarction (M4-M6 cortex): 3 Total score (0-10 with 10 being normal): 10 IMPRESSION: 1. Positive for hyperdense right MCA suspicious for emergent large vessel occlusion. No acute intracranial hemorrhage or changes of acute cortically based infarct are identified. 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 2. ASPECTS is 10. 3. Study discussed by telephone with Dr. Lawana Pai on 09/27/2016 at 1225 hours. CTA is pending. Electronically Signed   By: Genevie Ann M.D.   On: 09/27/2016 12:28       Assessment and plan discussed with with attending physician and they are in agreement.    Etta Quill  PA-C [facility] [phone]  09/27/2016, 12:51 PM   Assessment: 61 y.o. male with sudden onset of left facial droop, dysarthria, left arm and leg flaccidity. CT of head shows right MCA sign along with CTA of head showing cut off of the right M1. Patient is being taken to interventional for further intervention and attempt to withdraw clot.  Stroke Risk Factors - diabetes mellitus, hyperlipidemia, hypertension and smoking

## 2016-09-27 NOTE — Transfer of Care (Signed)
Immediate Anesthesia Transfer of Care Note  Patient: [name]  Procedure(s) Performed: Procedure(s): RADIOLOGY WITH ANESTHESIA (N/A)  Patient Location: ICU  Anesthesia Type:General  Level of Consciousness: awake, alert , oriented and patient cooperative  Airway & Oxygen Therapy: Patient remains intubated per anesthesia plan and Patient placed on Ventilator (see vital sign flow sheet for setting)  Post-op Assessment: Report given to RN  Post vital signs: Reviewed and stable  Last Vitals:  Vitals:   09/27/16 1236  BP: 138/63  Pulse: (!) 58  Resp: 19    Last Pain: There were no vitals filed for this visit.       Complications: No apparent anesthesia complications

## 2016-09-27 NOTE — Sedation Documentation (Signed)
Pick Pt up in the ED to transport to IR suite. Met Pts nurse and stoke coordinator in hallway.  Pt was alert and oriented. Once in IR suite Dr. Kathi Ludwig explained the procedure and obtained consent. Pt was the quickly transferred to procedure table and was quickly intubated by anesthesia.

## 2016-09-27 NOTE — Progress Notes (Signed)
eLink Physician-Brief Progress Note Patient Name: [name] DOB: November 02, 1955 MRN: [id number]   Date of Service  09/27/2016  HPI/Events of Note  61 yo male admitted with ischemic CVA. Now intubated and ventilated s/p mechanical thrombectomy.   eICU Interventions  Will order: 1. Propofol IV infusion. 2. SCD's to bilateral LE's. 3. Mechanical Ventilation: 60%/PRVC 16/TV 560/P 5. 4. ABG now. 5. Portable CXR now to assess ETT position.      Intervention Category Evaluation Type: New Patient Evaluation  Lysle Dingwall 09/27/2016, 5:27 PM

## 2016-09-27 NOTE — Consult Note (Signed)
PULMONARY / CRITICAL CARE MEDICINE   Name: [name] MRN: [id number] DOB: 02/11/1956    ADMISSION DATE:  09/27/2016 CONSULTATION DATE:  09/27/16  REFERRING MD:  Estanislado Pandy  CHIEF COMPLAINT:  AMS  HISTORY OF PRESENT ILLNESS:  Pt is encephelopathic; therefore, this HPI is obtained from chart review. [name] is a 61 y.o. male with PMH as outlined below. He was attempting to use left arm around 1045 but could not move it or his left leg.  Friends also noticed left facial droop and dysarthria. He was brought to [facility] ED where he had CT which showed dense right MCA sign.  He was given tPA then taken to neuro IR where he had complete revascularization.  He returned to the ICU on vent and PCCM called for vent management.  On arrival to ICU, has sinus brady with intermittent PVC's, HR in high 40's to 50's.  BMP, Mg, Phos pending.  PAST MEDICAL HISTORY :  He  has a past medical history of Anemia; Arthritis; CAD (coronary artery disease); Diabetes mellitus; Hyperlipidemia; Hypertension; Left ventricular dysfunction; Low back pain; MI (myocardial infarction); Obesity; PE (pulmonary embolism); and Tobacco abuse.  PAST SURGICAL HISTORY: He  has a past surgical history that includes Coronary stent placement; Gastric bypass; Coronary artery bypass graft; Cholecystectomy; Hernia repair; ir generic historical (09/27/2016); and ir generic historical (09/27/2016).  Allergies  Allergen Reactions  . Codeine     REACTION: makes me "looney"    No current facility-administered medications on file prior to encounter.    Current Outpatient Prescriptions on File Prior to Encounter  Medication Sig  . ALPRAZolam (XANAX) 1 MG tablet TAKE ONE TABLET BY MOUTH THREE TIMES DAILY AS NEEDED FOR ANXIETY  . aspirin 81 MG tablet Take 81 mg by mouth daily.    . Calcium Citrate-Vitamin D 500-400 MG-UNIT CHEW Chew 1 tablet by mouth 2 (two) times daily.    . Cholecalciferol (VITAMIN D) 2000 UNITS tablet Take 5,000  Units by mouth daily.   . cyanocobalamin 500 MCG tablet Take 2,500 mcg by mouth daily.  Marland Kitchen docusate sodium (COLACE) 100 MG capsule Take 100 mg by mouth as needed.    . fexofenadine (ALLEGRA) 180 MG tablet Take 180 mg by mouth daily as needed.  Marland Kitchen HYDROcodone-acetaminophen (NORCO) 10-325 MG tablet Take 1 tablet by mouth every 4 (four) hours as needed for severe pain.  . metoprolol succinate (TOPROL-XL) 25 MG 24 hr tablet TAKE 1 TABLET BY MOUTH TWICE DAILY  . Multiple Vitamin (MULTIVITAMIN) tablet Take 1 tablet by mouth daily.    . nitroGLYCERIN (NITROSTAT) 0.3 MG SL tablet Place 1 tablet (0.3 mg total) under the tongue every 5 (five) minutes as needed.  Marland Kitchen omeprazole (PRILOSEC) 20 MG capsule Take 1 capsule (20 mg total) by mouth 2 (two) times daily before a meal.  . simvastatin (ZOCOR) 40 MG tablet TAKE 1 TABLET BY MOUTH AT BEDTIME FOR CHOLESTEROL    FAMILY HISTORY:  His indicated that his mother is deceased. He indicated that his father is deceased. He indicated that two of his three brothers are alive. He indicated that his daughter is alive. He indicated that the status of his neg hx is unknown.    SOCIAL HISTORY: He  reports that he has been smoking Cigarettes.  He has a 40.00 pack-year smoking history. He has quit using smokeless tobacco. He reports that he does not drink alcohol or use drugs.  REVIEW OF SYSTEMS:   Unable to obtain as pt is  encephalopathic.  SUBJECTIVE:  On vent, follows commands.  VITAL SIGNS: BP (!) 147/81   Pulse (!) 54   Temp 98.9 F (37.2 C) (Oral)   Resp 16   Ht 5\' 9"  (1.753 m) Comment: Simultaneous filing. User may not have seen previous data.  Wt 80.8 kg (178 lb 2.1 oz)   SpO2 100%   BMI 26.31 kg/m   HEMODYNAMICS:    VENTILATOR SETTINGS: Vent Mode: PRVC FiO2 (%):  [60 %] 60 % Set Rate:  [16 bmp] 16 bmp Vt Set:  [520 mL-560 mL] 560 mL PEEP:  [5 cmH20] 5 cmH20 Plateau Pressure:  [12 cmH20] 12 cmH20  INTAKE / OUTPUT: No intake/output data  recorded.   PHYSICAL EXAMINATION: General: Adult male, in NAD. Neuro: Sedated, follows commands. HEENT: [place]/AT. PERRL, sclerae anicteric. Cardiovascular: RRR, no M/R/G.  Lungs: Respirations even and unlabored.  CTA bilaterally, No W/R/R. Abdomen: BS x 4, soft, NT/ND.  Musculoskeletal: No gross deformities, no edema.  Skin: Intact, warm, no rashes.  LABS:  BMET  Recent Labs Lab 09/27/16 1241  NA 142  K 4.3  CL 107  BUN 18  CREATININE 1.20  GLUCOSE 85    Electrolytes No results for input(s): CALCIUM, MG, PHOS in the last 168 hours.  CBC  Recent Labs Lab 09/27/16 1205 09/27/16 1241  WBC 5.7  --   HGB 13.7 12.6*  HCT 41.2 37.0*  PLT 140*  --     Coag's  Recent Labs Lab 09/27/16 1205  APTT 29  INR 1.10    Sepsis Markers No results for input(s): LATICACIDVEN, PROCALCITON, O2SATVEN in the last 168 hours.  ABG No results for input(s): PHART, PCO2ART, PO2ART in the last 168 hours.  Liver Enzymes No results for input(s): AST, ALT, ALKPHOS, BILITOT, ALBUMIN in the last 168 hours.  Cardiac Enzymes No results for input(s): TROPONINI, PROBNP in the last 168 hours.  Glucose No results for input(s): GLUCAP in the last 168 hours.  Imaging Ct Angio Head W Or Wo Contrast  Result Date: 09/27/2016 CLINICAL DATA:  61 year old male code stroke with suspicion of right MCA occlusion on noncontrast head CT. Initial encounter. EXAM: CT ANGIOGRAPHY HEAD AND NECK TECHNIQUE: Multidetector CT imaging of the head and neck was performed using the standard protocol during bolus administration of intravenous contrast. Multiplanar CT image reconstructions and MIPs were obtained to evaluate the vascular anatomy. Carotid stenosis measurements (when applicable) are obtained utilizing NASCET criteria, using the distal internal carotid diameter as the denominator. CONTRAST:  50 mL Isovue 370 COMPARISON:  Noncontrast head CT 1214 hours today. FINDINGS: CTA NECK Skeleton: Absent maxillary  dentition. Lower cervical ACDF hardware. Previous median sternotomy. No acute osseous abnormality identified. Upper chest: Centrilobular and paraseptal emphysema. Previous CABG. No superior mediastinal lymphadenopathy. Other neck: Negative thyroid, larynx, pharynx, parapharyngeal spaces, retropharyngeal space, sublingual space, submandibular glands and parotid glands. No cervical lymphadenopathy. Aortic arch: 3 vessel arch configuration. Mild to moderate mostly distal arch calcified atherosclerosis. Right carotid system: Negative brachiocephalic artery and right CCA origin. No right CCA stenosis. Calcified plaque at the right carotid bifurcation including in the right ICA origin and bulb, but no significant stenosis. Mildly tortuous cervical right ICA. Left carotid system: Negative left CCA origin. Calcified plaque along the anterior left CCA without stenosis proximal to the bifurcation. Bulky calcified plaque at the left carotid bifurcation resulting in high-grade left ICA origin stenosis (numerically estimated at 80 % with respect to the distal vessel) and approaching a RADIOGRAPHIC STRING SIGN. See series 5,  image 120 and series 8, image 114. Calcified plaque continues in the left ICA bulb without additional stenosis. Vertebral arteries: No proximal right subclavian artery or right vertebral artery origins stenosis. Negative cervical right vertebral artery. Calcified plaque in the proximal left subclavian artery without stenosis. Bulky calcified plaque at the left vertebral artery origin resulting in mild to moderate origin stenosis (series 8, image 144). The left vertebral artery is mildly dominant, with no additional stenosis to the skullbase. CTA HEAD Posterior circulation: Dominant distal left vertebral artery primarily supplies the basilar. The non dominant distal right vertebral artery is diminutive beyond the right PICA origin. There is mild left V4 segment calcified plaque without distal left vertebral  artery stenosis. Mild irregularity at the vertebrobasilar junction does not appear hemodynamically significant. Mildly tortuous basilar artery without stenosis. SCA and PCA origins are patent. Posterior communicating arteries are diminutive or absent. Bilateral PCA branches appear within normal limits. Anterior circulation: Both ICA siphons are patent. There is fairly extensive bilateral siphon calcified plaque, but this does not appear to be hemodynamically significant. Both carotid termini are patent. MCA and ACA origins are normal. Diminutive or absent anterior communicating artery. Bilateral ACA branches, left MCA M1 segment, and left MCA branches appear within normal limits. The right MCA M1 segment is occluded 9-10 mm beyond its origin. See series 11, image 22 and series 9, image 18. While there is no enhancement at the right MCA bifurcationthere is some reconstitution of the right MCA branches as seen on series 9, image 16. Venous sinuses: Appear patent. Anatomic variants: Dominant left vertebral artery. Review of the MIP images confirms the above findings IMPRESSION: 1. 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 This was relayed via text pager to [facility] on 09/27/2016 at 1230 hours, and later we discussed by telephone. 2. Superimposed right carotid calcified atherosclerosis without hemodynamically significant stenosis. 3. However, there is High-grade (80%, approaching RADIOGRAPHIC STRING SIGN) stenosis at the left ICA origin. 4. Dominant left vertebral artery with up to moderate origin stenosis due to calcified plaque. Non dominant right vertebral artery functionally terminates in PICA. Electronically Signed   By: Genevie Ann M.D.   On: 09/27/2016 12:46   Ct Head Wo Contrast  Result Date: 09/27/2016 CLINICAL DATA:  61 year old male with ELVO, right MCA occlusion status post endovascular reperfusion. Initial encounter. EXAM: CT HEAD WITHOUT CONTRAST TECHNIQUE: Contiguous axial  images were obtained from the base of the skull through the vertex without intravenous contrast. COMPARISON:  Endovascular images earlier today. CTA head and neck 1227 hours today, and earlier. FINDINGS: Brain: Parenchymal staining in the posterior right temporal lobe, and some of the more posterior right MCA territory including the inferior right parietal lobe. No associated mass effect. No acute intracranial hemorrhage identified. No superimposed right MCA cytotoxic edema identified; chronic right inferior frontal gyrus encephalomalacia. No ventriculomegaly or intracranial mass effect. Vascular: Calcified atherosclerosis at the skull base. Residual intravascular contrast. Skull: No acute osseous abnormality identified. Sinuses/Orbits: Intubated. Stable mild right maxillary sinus mucosal thickening. Other: No acute orbit or scalp soft tissue findings. IMPRESSION: 1. Post endovascular Neuro-intervention parenchymal staining in the right temporal and parietal lobes. No superimposed cytotoxic edema, acute intracranial hemorrhage or mass effect identified. 2. Small area of chronic encephalomalacia in the right anterior insula / inferior frontal gyrus. Electronically Signed   By: Genevie Ann M.D.   On: 09/27/2016 16:39   Ct Angio Neck W Or Wo Contrast  Result Date: 09/27/2016 CLINICAL DATA:  61 year old male code stroke with suspicion of right MCA occlusion on noncontrast head CT. Initial encounter. EXAM: CT ANGIOGRAPHY HEAD AND NECK TECHNIQUE: Multidetector CT imaging of the head and neck was performed using the standard protocol during bolus administration of intravenous contrast. Multiplanar CT image reconstructions and MIPs were obtained to evaluate the vascular anatomy. Carotid stenosis measurements (when applicable) are obtained utilizing NASCET criteria, using the distal internal carotid diameter as the denominator. CONTRAST:  50 mL Isovue 370 COMPARISON:  Noncontrast head CT 1214 hours today. FINDINGS: CTA NECK  Skeleton: Absent maxillary dentition. Lower cervical ACDF hardware. Previous median sternotomy. No acute osseous abnormality identified. Upper chest: Centrilobular and paraseptal emphysema. Previous CABG. No superior mediastinal lymphadenopathy. Other neck: Negative thyroid, larynx, pharynx, parapharyngeal spaces, retropharyngeal space, sublingual space, submandibular glands and parotid glands. No cervical lymphadenopathy. Aortic arch: 3 vessel arch configuration. Mild to moderate mostly distal arch calcified atherosclerosis. Right carotid system: Negative brachiocephalic artery and right CCA origin. No right CCA stenosis. Calcified plaque at the right carotid bifurcation including in the right ICA origin and bulb, but no significant stenosis. Mildly tortuous cervical right ICA. Left carotid system: Negative left CCA origin. Calcified plaque along the anterior left CCA without stenosis proximal to the bifurcation. Bulky calcified plaque at the left carotid bifurcation resulting in high-grade left ICA origin stenosis (numerically estimated at 80 % with respect to the distal vessel) and approaching a RADIOGRAPHIC STRING SIGN. See series 5, image 120 and series 8, image 114. Calcified plaque continues in the left ICA bulb without additional stenosis. Vertebral arteries: No proximal right subclavian artery or right vertebral artery origins stenosis. Negative cervical right vertebral artery. Calcified plaque in the proximal left subclavian artery without stenosis. Bulky calcified plaque at the left vertebral artery origin resulting in mild to moderate origin stenosis (series 8, image 144). The left vertebral artery is mildly dominant, with no additional stenosis to the skullbase. CTA HEAD Posterior circulation: Dominant distal left vertebral artery primarily supplies the basilar. The non dominant distal right vertebral artery is diminutive beyond the right PICA origin. There is mild left V4 segment calcified plaque  without distal left vertebral artery stenosis. Mild irregularity at the vertebrobasilar junction does not appear hemodynamically significant. Mildly tortuous basilar artery without stenosis. SCA and PCA origins are patent. Posterior communicating arteries are diminutive or absent. Bilateral PCA branches appear within normal limits. Anterior circulation: Both ICA siphons are patent. There is fairly extensive bilateral siphon calcified plaque, but this does not appear to be hemodynamically significant. Both carotid termini are patent. MCA and ACA origins are normal. Diminutive or absent anterior communicating artery. Bilateral ACA branches, left MCA M1 segment, and left MCA branches appear within normal limits. The right MCA M1 segment is occluded 9-10 mm beyond its origin. See series 11, image 22 and series 9, image 18. While there is no enhancement at the right MCA bifurcationthere is some reconstitution of the right MCA branches as seen on series 9, image 16. Venous sinuses: Appear patent. Anatomic variants: Dominant left vertebral artery. Review of the MIP images confirms the above findings IMPRESSION: 1. 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 This was relayed via text pager to [facility] on 09/27/2016 at 1230 hours, and later we discussed by telephone. 2. Superimposed right carotid calcified atherosclerosis without hemodynamically significant stenosis. 3. However, there is High-grade (80%, approaching RADIOGRAPHIC STRING SIGN) stenosis at the left ICA origin. 4. Dominant left vertebral artery with up to moderate origin stenosis  due to calcified plaque. Non dominant right vertebral artery functionally terminates in PICA. Electronically Signed   By: Genevie Ann M.D.   On: 09/27/2016 12:46   Ir Percutaneous Art Thrombectomy/infusion Intracranial Inc Diag Angio  Result Date: 09/27/2016 INDICATION: 61 year old male presents with emergent large vessel occlusion of the right MCA. Given  his high NIH stroke scale, baseline function, and within the window for treatment, he would like to proceed with cerebral angiogram and mechanical thrombectomy. EXAM: IR PERCUTANEOUS ART THORMBECTOMY/INFUSION INTRACRANIAL INCLUDE DIAG ANGIO; IR ANGIO INTRA EXTRACRAN SEL COM CAROTID INNOMINATE UNI LEFT MOD SED COMPARISON:  Noncontrast head CT 09/27/2016, cerebral CT angiogram 09/27/2016 MEDICATIONS: 2.0 g Ancef. The antibiotic was administered within 1 hour of the procedure ANESTHESIA/SEDATION: General endotracheal anesthesia CONTRAST:  190 cc Isovue FLUOROSCOPY TIME:  Fluoroscopy Time: 86 minutes 0 seconds (4316 mGy). COMPLICATIONS: SIR Level A - No therapy, no consequence. TECHNIQUE: Two physicians were involved with this case. Case was initiated with Dr. Estanislado Pandy, and was completed by Dr. Earleen Newport. Both physicians were present for the informed written consent, which was obtained from the patient after a thorough discussion of the procedural risks, benefits and alternatives. Specific risks discussed include: Bleeding, infection, contrast reaction, kidney injury/failure, need for further procedure/surgery, arterial injury or dissection, embolization to new territory, intracranial hemorrhage (10-15% risk), neurologic deterioration, cardiopulmonary collapse, death. All questions were addressed. Maximal Sterile Barrier Technique was utilized including during the procedure including caps, mask, sterile gowns, sterile gloves, sterile drape, hand hygiene and skin antiseptic. A timeout was performed prior to the initiation of the procedure. Dr. Estanislado Pandy initiated the procedure. Eleven blade was used to make a small incision at the skin site in the right inguinal region. A micropuncture needle was used access the right common femoral artery. With excellent arterial blood flow returned, an .018 micro wire was passed through the needle, observed to enter the abdominal aorta under fluoroscopy. The needle was removed, and a  micropuncture sheath was placed over the wire. The inner dilator and wire were removed, and an 035 wire was advanced under fluoroscopy into the abdominal aorta. The sheath was removed and a standard 5 [place] vascular sheath was placed. The dilator was removed and the sheath was flushed. A 18F JB-1 diagnostic catheter was advanced over the wire to the proximal descending thoracic aorta. Wire was then removed. Double flush of the catheter was performed. Catheter was then used to select the innominate artery. Angiogram was performed. Using roadmap technique, the catheter was advanced over a roadrunner wire into right common carotid artery. Formal angiogram was performed. Exchange length Rosen wire was then passed through the diagnostic catheter to the distal common carotid artery and the diagnostic catheter was removed. The 5 French sheath was removed and exchanged for 8 French 55 centimeter BrightTip sheath. Sheath was flushed and attached to pressurized and heparinized saline bag for constant forward flow. Then an 8 [place], 85 cm Flowgate balloon tip catheter was prepared on the back table with inflation of the balloon with 50/50 concentration of dilute contrast. The balloon catheter was then advanced over the wire, positioned into the distal common carotid artery. Copious back flush was performed and the balloon catheter was attached to heparinized and pressurized saline bag for forward flow. Flowgate balloon catheter was then advanced over the road runner wire into the distal internal carotid artery. Angiogram was performed for road map guide. Microcatheter system was then introduced through an intermediate catheter, Ace 64, and the balloon guide catheter, using a synchro  soft 014 wire and a Trevo Provue18 catheter. Microcatheter and intermediate catheter system was advanced into the internal carotid artery, to the level of the occlusion. The micro wire was then carefully advanced through the occluded segment.  Microcatheter was then push through the occluded segment and the wire was removed. Blood was then aspirated through the hub of the microcatheter, and a gentle contrast injection was performed confirming intraluminal position. A rotating hemostatic valve was then attached to the back end of the microcatheter, and a pressurized and heparinized saline bag was attached to the catheter. 4 x 40 solitaire device was then selected. Back flush was achieved at the rotating hemostatic valve, and then the device was gently advanced through the microcatheter to the distal end. The retriever was then unsheathed by withdrawing the microcatheter under fluoroscopy. Once the retriever was completely unsheathed, control angiogram was performed from the balloon catheter. The intermediate Ace 64 catheter was then advanced into the middle cerebral artery for local aspiration. Aspiration was then performed at the tip of the Ace catheter as the retriever was gently and slowly withdrawn with fluoroscopic observation. Once the retriever was entirely removed from the system, free aspiration was confirmed at the hub of the intermediate catheter, with free blood return confirmed. Control angiogram was performed. Given incomplete reperfusion, another attempt was initiated. Has the microcatheter system was advanced under road map fluoroscopic guidance into the right middle cerebral artery, it was recognized that there was prolapse of the system into the aortic arch. The entire balloon catheter, intermediate catheter, microcatheter system had prolapsed into the aortic arch and require removal. Dr Earleen Newport was then present for the remainder of the case. Once the system was double flushed, a JB 1 catheter was advanced into the aortic arch and used to select the common carotid artery. Control angiogram was performed. The catheter was then used to select the common carotid artery with control run performed. The JB 1 catheter and roadrunner wire were  then advanced into the proximal internal carotid artery, and after confirming location with a control angiogram, the wire was removed. Rosen wire was then placed into the internal carotid artery and the JB catheter was removed. Again, 8 coaxial system of microcatheter, intermediate Ace 64 catheter, and the balloon guide were advanced over the Rosen wire into the internal carotid artery. Wire was removed and once the system was aspirated and flushed with pressurized heparin bags attached, a control angiogram was performed. The microcatheter and micro wire were then advanced across the occlusion for a second thrombectomy attempt. The second attempt was also local aspiration through the penumbra catheter and use of 4 x 40 solitaire stentreiver. Control angiogram demonstrated proximal embolization of the distal MCA with decreased TICI perfusion. Third attempt was performed using similar technique, local aspiration and solitaire 4 x 40 stentriever. In total 5 passes were made with local aspiration via Ace 64 catheter and 4 x 40 stentriever. The final and sixth pass was performed to re-establish flow through a dominant insular branch, using a 4 x 30 Trevo stentriever and a 64 aspiration. Trevo aspiration with proximal flow control (TRAP) technique was used on the final pass, with reestablished near complete, TICI 2b flow. Catheter and wires were removed from the right-sided cerebral circulation. JB 1 catheter was readvanced to the aortic arch, and angiogram of the left carotid system was performed including cerebral images. JB 1 catheter was removed. Control angiogram was performed at the right common femoral artery puncture site. The 8 [place] sheath  was exchanged for a standard 9 French sheath at the common femoral artery puncture site. Patient tolerated the procedure well and remained hemodynamically stable throughout. No complications were encountered and no significant blood loss encountered. FINDINGS: Initial  cerebral angiogram Right common carotid artery: Normal course caliber and contour. Mild atherosclerotic changes at the right carotid bulb with no significant stenosis. Minimal tortuosity of the right ICA. Right external carotid artery: Patent with antegrade flow. Right internal carotid artery: Minimal tortuosity of the right internal carotid. Right MCA: Occlusion of the proximal right MCA, with no significant plaque present at the cavernous segment, ophthalmic segment, to the terminus. Ophthalmic artery patent. Posterior communicating artery patent. There is a temporal branch patent just proximal to the MCA occlusion. At the initiation of the case there is no significant collateral flow to the right MCA territory secondary to [facility] branches. Right ACA: A 1 segment patent. A 2 segment perfuses the right sided territory. No significant collateral filling of the right MCA territory upon initiation of the case. After the first pass, there is TICI 2b established. Persistent occlusion of a proximal insular branch with absence of MCA cortical branches. After the second pass, there was more proximal embolization of the clot, with decreased perfusion to the right MCA territory. TICI 1 Third - fifth passes were useful in reestablishing TICI 2a flow. The final and sixth pass targeting large proximal insular branch occlusion was successful in reestablishing TICI 2b flow. Left common carotid artery:  Normal course caliber and contour. Left external carotid artery: Patent with antegrade flow. Left internal carotid artery: Moderate atherosclerotic changes at the left carotid bulb with no significant narrowing on the lateral view and the anterior view demonstrating approximately 58%. No significant tortuosity of the left carotid system. Left MCA: M1 segment patent. Insular and opercular segments patent. Unremarkable caliber and course of the cortical segments. Typical arterial, capillary/ parenchymal, and venous phase. Left ACA: A 1  segment patent. A 2 segment perfuses the left territory. IMPRESSION: Status post cerebral angiogram with mechanical thrombectomy of proximal right MCA occlusion and reestablished near-complete, TICI 2b flow to the right hemisphere. Left cerebral angiogram demonstrating adequate perfusion. Left cervical angiogram demonstrates moderate carotid bulb disease, measuring approximately 58% stenosis. Signed, Dulcy Fanny. Earleen Newport, DO [facility] Electronically Signed   By: Corrie Mckusick D.O.   On: 09/27/2016 17:56   Ct Head Code Stroke W/o Cm  Result Date: 09/27/2016 CLINICAL DATA:  Code stroke. 61 year old male with left side weakness and slurred speech. Initial encounter. EXAM: CT HEAD WITHOUT CONTRAST TECHNIQUE: Contiguous axial images were obtained from the base of the skull through the vertex without intravenous contrast. COMPARISON:  Cervical spine MRI 03/30/2014 FINDINGS: Brain: No acute intracranial hemorrhage identified. No midline shift, mass effect, or evidence of intracranial mass lesion. 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 There is some associated right external capsule hypodensity. No superimposed acute cortically based infarct changes are identified. No other cortical encephalomalacia. Gray-white matter differentiation outside of the area above appears within normal limits. No ventriculomegaly. Vascular: Hyperdense right MCA (series 4, image 27). Skull: No acute osseous abnormality identified. Sinuses/Orbits: Mild mastoid effusion. Adenoid hypertrophy. Paranasal sinuses are clear except for right maxillary mucosal thickening. Other: Mild rightward gaze deviation. Negative orbit and scalp soft tissues otherwise. ASPECTS [facility] Stroke Program Early CT Score) - Ganglionic level infarction (caudate, lentiform nuclei, internal capsule, insula, M1-M3  cortex):  7 - Supraganglionic infarction (M4-M6 cortex): 3 Total score (0-10 with 10 being normal): 10 IMPRESSION: 1. Positive for hyperdense right MCA suspicious for emergent large vessel occlusion. No acute intracranial hemorrhage or changes of acute cortically based infarct are identified. 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 2. ASPECTS is 10. 3. Study discussed by telephone with Dr. Lawana Pai on 09/27/2016 at 1225 hours. CTA is pending. Electronically Signed   By: Genevie Ann M.D.   On: 09/27/2016 12:28   Ir Angio Intra Extracran Sel Com Carotid Innominate Uni L Mod Sed  Result Date: 09/27/2016 INDICATION: 61 year old male presents with emergent large vessel occlusion of the right MCA. Given his high NIH stroke scale, baseline function, and within the window for treatment, he would like to proceed with cerebral angiogram and mechanical thrombectomy. EXAM: IR PERCUTANEOUS ART THORMBECTOMY/INFUSION INTRACRANIAL INCLUDE DIAG ANGIO; IR ANGIO INTRA EXTRACRAN SEL COM CAROTID INNOMINATE UNI LEFT MOD SED COMPARISON:  Noncontrast head CT 09/27/2016, cerebral CT angiogram 09/27/2016 MEDICATIONS: 2.0 g Ancef. The antibiotic was administered within 1 hour of the procedure ANESTHESIA/SEDATION: General endotracheal anesthesia CONTRAST:  190 cc Isovue FLUOROSCOPY TIME:  Fluoroscopy Time: 86 minutes 0 seconds (4316 mGy). COMPLICATIONS: SIR Level A - No therapy, no consequence. TECHNIQUE: Two physicians were involved with this case. Case was initiated with Dr. Estanislado Pandy, and was completed by Dr. Earleen Newport. Both physicians were present for the informed written consent, which was obtained from the patient after a thorough discussion of the procedural risks, benefits and alternatives. Specific risks discussed include: Bleeding, infection, contrast reaction, kidney injury/failure, need for further procedure/surgery, arterial injury or dissection, embolization to new territory,  intracranial hemorrhage (10-15% risk), neurologic deterioration, cardiopulmonary collapse, death. All questions were addressed. Maximal Sterile Barrier Technique was utilized including during the procedure including caps, mask, sterile gowns, sterile gloves, sterile drape, hand hygiene and skin antiseptic. A timeout was performed prior to the initiation of the procedure. Dr. Estanislado Pandy initiated the procedure. Eleven blade was used to make a small incision at the skin site in the right inguinal region. A micropuncture needle was used access the right common femoral artery. With excellent arterial blood flow returned, an .018 micro wire was passed through the needle, observed to enter the abdominal aorta under fluoroscopy. The needle was removed, and a micropuncture sheath was placed over the wire. The inner dilator and wire were removed, and an 035 wire was advanced under fluoroscopy into the abdominal aorta. The sheath was removed and a standard 5 [place] vascular sheath was placed. The dilator was removed and the sheath was flushed. A 49F JB-1 diagnostic catheter was advanced over the wire to the proximal descending thoracic aorta. Wire was then removed. Double flush of the catheter was performed. Catheter was then used to select the innominate artery. Angiogram was performed. Using roadmap technique, the catheter was advanced over a roadrunner wire into right common carotid artery. Formal angiogram was performed. Exchange length Rosen wire was then passed through the diagnostic catheter to the distal common carotid artery and the diagnostic catheter was removed. The 5 French sheath was removed and exchanged for 8 French 55 centimeter BrightTip sheath. Sheath was flushed and attached to pressurized and heparinized saline bag for constant forward flow. Then an 8 [place], 85 cm Flowgate balloon tip catheter was prepared on the back table with inflation of the balloon with 50/50 concentration of dilute contrast. The  balloon catheter was then advanced over the wire, positioned into  the distal common carotid artery. Copious back flush was performed and the balloon catheter was attached to heparinized and pressurized saline bag for forward flow. Flowgate balloon catheter was then advanced over the road runner wire into the distal internal carotid artery. Angiogram was performed for road map guide. Microcatheter system was then introduced through an intermediate catheter, Ace 64, and the balloon guide catheter, using a synchro soft 014 wire and a Trevo Provue18 catheter. Microcatheter and intermediate catheter system was advanced into the internal carotid artery, to the level of the occlusion. The micro wire was then carefully advanced through the occluded segment. Microcatheter was then push through the occluded segment and the wire was removed. Blood was then aspirated through the hub of the microcatheter, and a gentle contrast injection was performed confirming intraluminal position. A rotating hemostatic valve was then attached to the back end of the microcatheter, and a pressurized and heparinized saline bag was attached to the catheter. 4 x 40 solitaire device was then selected. Back flush was achieved at the rotating hemostatic valve, and then the device was gently advanced through the microcatheter to the distal end. The retriever was then unsheathed by withdrawing the microcatheter under fluoroscopy. Once the retriever was completely unsheathed, control angiogram was performed from the balloon catheter. The intermediate Ace 64 catheter was then advanced into the middle cerebral artery for local aspiration. Aspiration was then performed at the tip of the Ace catheter as the retriever was gently and slowly withdrawn with fluoroscopic observation. Once the retriever was entirely removed from the system, free aspiration was confirmed at the hub of the intermediate catheter, with free blood return confirmed. Control angiogram  was performed. Given incomplete reperfusion, another attempt was initiated. Has the microcatheter system was advanced under road map fluoroscopic guidance into the right middle cerebral artery, it was recognized that there was prolapse of the system into the aortic arch. The entire balloon catheter, intermediate catheter, microcatheter system had prolapsed into the aortic arch and require removal. Dr Earleen Newport was then present for the remainder of the case. Once the system was double flushed, a JB 1 catheter was advanced into the aortic arch and used to select the common carotid artery. Control angiogram was performed. The catheter was then used to select the common carotid artery with control run performed. The JB 1 catheter and roadrunner wire were then advanced into the proximal internal carotid artery, and after confirming location with a control angiogram, the wire was removed. Rosen wire was then placed into the internal carotid artery and the JB catheter was removed. Again, 8 coaxial system of microcatheter, intermediate Ace 64 catheter, and the balloon guide were advanced over the Rosen wire into the internal carotid artery. Wire was removed and once the system was aspirated and flushed with pressurized heparin bags attached, a control angiogram was performed. The microcatheter and micro wire were then advanced across the occlusion for a second thrombectomy attempt. The second attempt was also local aspiration through the penumbra catheter and use of 4 x 40 solitaire stentreiver. Control angiogram demonstrated proximal embolization of the distal MCA with decreased TICI perfusion. Third attempt was performed using similar technique, local aspiration and solitaire 4 x 40 stentriever. In total 5 passes were made with local aspiration via Ace 64 catheter and 4 x 40 stentriever. The final and sixth pass was performed to re-establish flow through a dominant insular branch, using a 4 x 30 Trevo stentriever and a 64  aspiration. Trevo aspiration with proximal flow  control (TRAP) technique was used on the final pass, with reestablished near complete, TICI 2b flow. Catheter and wires were removed from the right-sided cerebral circulation. JB 1 catheter was readvanced to the aortic arch, and angiogram of the left carotid system was performed including cerebral images. JB 1 catheter was removed. Control angiogram was performed at the right common femoral artery puncture site. The 8 French sheath was exchanged for a standard 9 French sheath at the common femoral artery puncture site. Patient tolerated the procedure well and remained hemodynamically stable throughout. No complications were encountered and no significant blood loss encountered. FINDINGS: Initial cerebral angiogram Right common carotid artery: Normal course caliber and contour. Mild atherosclerotic changes at the right carotid bulb with no significant stenosis. Minimal tortuosity of the right ICA. Right external carotid artery: Patent with antegrade flow. Right internal carotid artery: Minimal tortuosity of the right internal carotid. Right MCA: Occlusion of the proximal right MCA, with no significant plaque present at the cavernous segment, ophthalmic segment, to the terminus. Ophthalmic artery patent. Posterior communicating artery patent. There is a temporal branch patent just proximal to the MCA occlusion. At the initiation of the case there is no significant collateral flow to the right MCA territory secondary to [place] branches. Right ACA: A 1 segment patent. A 2 segment perfuses the right sided territory. No significant collateral filling of the right MCA territory upon initiation of the case. After the first pass, there is TICI 2b established. Persistent occlusion of a proximal insular branch with absence of MCA cortical branches. After the second pass, there was more proximal embolization of the clot, with decreased perfusion to the right MCA territory. TICI 1  Third - fifth passes were useful in reestablishing TICI 2a flow. The final and sixth pass targeting large proximal insular branch occlusion was successful in reestablishing TICI 2b flow. Left common carotid artery:  Normal course caliber and contour. Left external carotid artery: Patent with antegrade flow. Left internal carotid artery: Moderate atherosclerotic changes at the left carotid bulb with no significant narrowing on the lateral view and the anterior view demonstrating approximately 58%. No significant tortuosity of the left carotid system. Left MCA: M1 segment patent. Insular and opercular segments patent. Unremarkable caliber and course of the cortical segments. Typical arterial, capillary/ parenchymal, and venous phase. Left ACA: A 1 segment patent. A 2 segment perfuses the left territory. IMPRESSION: Status post cerebral angiogram with mechanical thrombectomy of proximal right MCA occlusion and reestablished near-complete, TICI 2b flow to the right hemisphere. Left cerebral angiogram demonstrating adequate perfusion. Left cervical angiogram demonstrates moderate carotid bulb disease, measuring approximately 58% stenosis. Signed, Dulcy Fanny. Earleen Newport, DO [facility] Electronically Signed   By: Corrie Mckusick D.O.   On: 09/27/2016 17:56     STUDIES:  CT head 02/13 > right MCA occlusion, high grade left ICA stenosis. Echo 2/14 >  CT head 2/14 > MRI brain 2/14 >  CULTURES: None.  ANTIBIOTICS: None.  SIGNIFICANT EVENTS: 02/13 > admit.  LINES/TUBES: ETT 02/13 > L rad a line 02/13 >  DISCUSSION: 61 y.o. male with acute right MCA infarct.  Received tPA then taken for IR revascularization.  Remained intubated post procedure.  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 Acute encephalopathy - due to above + sedation. P:   Neurology following / managing. Stroke workup per  neuro. Sedation:  Propofol gtt / Fentanyl PRN. RASS goal: 0  to -1. Daily WUA. Hold preadmission alprazolam, norco.  PULMONARY A: VDRF - due to inability to protect airway in setting acute CVA. Tobacco dependence. Hx PE. P:   Full vent support. Assess ABG now. Wean as able. VAP prevention measures. SBT in AM for hopeful extubation. CXR in AM.  CARDIOVASCULAR A:  Sinus brady. Hypertension - goal SBP ~120 per neuro. Hx MI, CAD, HTN, HLD.  P:  Assess STAT BMP, Mg, Phos, echo. May need cards involvement. Cardene gtt as needed for goal SBP ~ 120. Continue preadmission simvastatin. Hold preadmission aspirin, toprol-xl, nitro.  RENAL A:   Hypocalcemia. P:   1g Ca gluconate. NS @ 50. BMP in AM.  GASTROINTESTINAL A:   GI prophylaxis. Nutrition. P:   SUP: Pantoprazole. NPO.  HEMATOLOGIC A:   VTE Prophylaxis. P:  SCD's. CBC in AM.  INFECTIOUS A:   No indication of infection. P:   Monitor clinically.  ENDOCRINE A:   No acute issues.   P:   No interventions required.   Family updated: None available.  Interdisciplinary Family Meeting v Palliative Care Meeting:  Due by: 10/04/16.  CC time: 30 minutes.   Montey Hora, [place] [facility] Pager: [phone]  or [phone] 09/27/2016, 6:20 PM   STAFF NOTE: I, Merrie Roof, MD FACP have personally reviewed patient's available data, including medical history, events of note, physical examination and test results as part of my evaluation. I have discussed with resident/NP and other care providers such as pharmacist, RN and RRT. In addition, I personally evaluated patient and elicited key findings of: awakens on vent, lungs clear, rt pupil 4-5 mm, left 72mm, reactive sluggish, on propofol, moving ext strength improving on left, CT repeat reviewed s/p intervention with SAH blood but excellent improving neuro status, has some bigeminy, sys goals altered per neuro  after sah noted to 140 sys, likely need to treat with nicardipine now, with bigem, assess ecg, trop and lytes, assess post ett pcxr and abg on current MV on vent, consider repeat CT head in am , to remain on vent overnight as well, NPO for now, to family in room to update I did update the pt in full  The patient is critically ill with multiple organ systems failure and requires high complexity decision making for assessment and support, frequent evaluation and titration of therapies, application of advanced monitoring technologies and extensive interpretation of multiple databases.   Critical Care Time devoted to patient care services described in this note is 35 Minutes. This time reflects time of care of this signee: Merrie Roof, MD FACP. This critical care time does not reflect procedure time, or teaching time or supervisory time of PA/NP/Med student/Med Resident etc but could involve care discussion time. Rest per NP/medical resident whose note is outlined above and that I agree with   Lavon Paganini. Titus Mould, MD, [place] Pgr: [place] [facility] 09/27/2016 9:17 PM

## 2016-09-27 NOTE — Sedation Documentation (Signed)
Report called to Connie, RN

## 2016-09-28 ENCOUNTER — Inpatient Hospital Stay (HOSPITAL_COMMUNITY): Payer: Medicare HMO

## 2016-09-28 ENCOUNTER — Encounter (HOSPITAL_COMMUNITY): Payer: Self-pay | Admitting: Interventional Radiology

## 2016-09-28 DIAGNOSIS — E1159 Type 2 diabetes mellitus with other circulatory complications: Secondary | ICD-10-CM

## 2016-09-28 DIAGNOSIS — F17200 Nicotine dependence, unspecified, uncomplicated: Secondary | ICD-10-CM

## 2016-09-28 DIAGNOSIS — I63412 Cerebral infarction due to embolism of left middle cerebral artery: Secondary | ICD-10-CM | POA: Diagnosis present

## 2016-09-28 DIAGNOSIS — J9601 Acute respiratory failure with hypoxia: Secondary | ICD-10-CM

## 2016-09-28 DIAGNOSIS — I63411 Cerebral infarction due to embolism of right middle cerebral artery: Principal | ICD-10-CM

## 2016-09-28 DIAGNOSIS — I6789 Other cerebrovascular disease: Secondary | ICD-10-CM

## 2016-09-28 DIAGNOSIS — I63319 Cerebral infarction due to thrombosis of unspecified middle cerebral artery: Secondary | ICD-10-CM

## 2016-09-28 DIAGNOSIS — F1010 Alcohol abuse, uncomplicated: Secondary | ICD-10-CM

## 2016-09-28 DIAGNOSIS — I6523 Occlusion and stenosis of bilateral carotid arteries: Secondary | ICD-10-CM

## 2016-09-28 LAB — RAPID URINE DRUG SCREEN, HOSP PERFORMED
AMPHETAMINES: NOT DETECTED
Barbiturates: NOT DETECTED
Benzodiazepines: POSITIVE — AB
Cocaine: NOT DETECTED
Opiates: NOT DETECTED
TETRAHYDROCANNABINOL: POSITIVE — AB

## 2016-09-28 LAB — BASIC METABOLIC PANEL
Anion gap: 8 (ref 5–15)
BUN: 11 mg/dL (ref 6–20)
CHLORIDE: 109 mmol/L (ref 101–111)
CO2: 22 mmol/L (ref 22–32)
CREATININE: 1.06 mg/dL (ref 0.61–1.24)
Calcium: 8.4 mg/dL — ABNORMAL LOW (ref 8.9–10.3)
Glucose, Bld: 104 mg/dL — ABNORMAL HIGH (ref 65–99)
POTASSIUM: 4.1 mmol/L (ref 3.5–5.1)
SODIUM: 139 mmol/L (ref 135–145)

## 2016-09-28 LAB — CBC
HCT: 33.3 % — ABNORMAL LOW (ref 39.0–52.0)
HEMOGLOBIN: 11.3 g/dL — AB (ref 13.0–17.0)
MCH: 32.4 pg (ref 26.0–34.0)
MCHC: 33.9 g/dL (ref 30.0–36.0)
MCV: 95.4 fL (ref 78.0–100.0)
PLATELETS: 156 10*3/uL (ref 150–400)
RBC: 3.49 MIL/uL — AB (ref 4.22–5.81)
RDW: 14.1 % (ref 11.5–15.5)
WBC: 7 10*3/uL (ref 4.0–10.5)

## 2016-09-28 LAB — LIPID PANEL
CHOLESTEROL: 107 mg/dL (ref 0–200)
HDL: 32 mg/dL — ABNORMAL LOW (ref >40)
LDL CALC: 58 mg/dL (ref 0–99)
Total CHOL/HDL Ratio: 3.3 RATIO
Triglycerides: 85 mg/dL (ref <150)
VLDL: 17 mg/dL (ref 0–40)

## 2016-09-28 LAB — TROPONIN I: Troponin I: 0.03 ng/mL (ref <0.03)

## 2016-09-28 LAB — ECHOCARDIOGRAM COMPLETE
HEIGHTINCHES: 69 in
WEIGHTICAEL: 2850.11 [oz_av]

## 2016-09-28 LAB — GLUCOSE, CAPILLARY
GLUCOSE-CAPILLARY: 100 mg/dL — AB (ref 65–99)
GLUCOSE-CAPILLARY: 138 mg/dL — AB (ref 65–99)

## 2016-09-28 MED ORDER — VITAMIN B-12 1000 MCG PO TABS
2500.0000 ug | ORAL_TABLET | Freq: Every day | ORAL | Status: DC
  Administered 2016-09-28 – 2016-09-30 (×3): 2500 ug via ORAL
  Filled 2016-09-28: qty 5
  Filled 2016-09-28: qty 1
  Filled 2016-09-28 (×2): qty 5

## 2016-09-28 MED ORDER — ASPIRIN EC 325 MG PO TBEC
325.0000 mg | DELAYED_RELEASE_TABLET | Freq: Every day | ORAL | Status: DC
  Administered 2016-09-28 – 2016-09-29 (×2): 325 mg via ORAL
  Filled 2016-09-28 (×2): qty 1

## 2016-09-28 MED ORDER — INSULIN ASPART 100 UNIT/ML ~~LOC~~ SOLN: 0.0000 [IU] | Freq: Three times a day (TID) | SUBCUTANEOUS | Status: DC

## 2016-09-28 MED ORDER — INSULIN ASPART 100 UNIT/ML ~~LOC~~ SOLN: 0.0000 [IU] | Freq: Every day | SUBCUTANEOUS | Status: DC

## 2016-09-28 MED ORDER — IPRATROPIUM-ALBUTEROL 0.5-2.5 (3) MG/3ML IN SOLN
3.0000 mL | Freq: Four times a day (QID) | RESPIRATORY_TRACT | Status: DC
  Administered 2016-09-28 (×3): 3 mL via RESPIRATORY_TRACT
  Filled 2016-09-28 (×3): qty 3

## 2016-09-28 MED ORDER — ADULT MULTIVITAMIN W/MINERALS CH
1.0000 | ORAL_TABLET | Freq: Every day | ORAL | Status: DC
  Administered 2016-09-28 – 2016-09-30 (×3): 1 via ORAL
  Filled 2016-09-28 (×3): qty 1

## 2016-09-28 MED ORDER — HYDROCODONE-ACETAMINOPHEN 5-325 MG PO TABS
1.0000 | ORAL_TABLET | Freq: Four times a day (QID) | ORAL | Status: DC | PRN
  Administered 2016-09-28: 1 via ORAL
  Filled 2016-09-28: qty 1

## 2016-09-28 MED ORDER — IPRATROPIUM-ALBUTEROL 0.5-2.5 (3) MG/3ML IN SOLN: 3.0000 mL | Freq: Three times a day (TID) | RESPIRATORY_TRACT | Status: DC

## 2016-09-28 MED ORDER — WHITE PETROLATUM GEL
Status: AC
  Administered 2016-09-28: 08:00:00
  Filled 2016-09-28: qty 1

## 2016-09-28 MED ORDER — PANTOPRAZOLE SODIUM 40 MG PO TBEC
40.0000 mg | DELAYED_RELEASE_TABLET | Freq: Every day | ORAL | Status: DC
  Administered 2016-09-28 – 2016-09-30 (×3): 40 mg via ORAL
  Filled 2016-09-28 (×3): qty 1

## 2016-09-28 NOTE — Progress Notes (Signed)
Report called to charge nurse Anderson Malta, RN on [phone]. Left radial aline and foley D/C'd per MD order. Nursing to continue to monitor.

## 2016-09-28 NOTE — Progress Notes (Signed)
PT Cancellation Note  Patient Details Name: [name] MRN: [id number] DOB: [birth year]   Cancelled Treatment:    Reason Eval/Treat Not Completed: Patient not medically ready. Pt with strict bedrest orders. Will wait for increase in activity orders before initiating PT eval.   Enis Gash, SPT [phone]  Mabeline Caras 09/28/2016, 8:45 AM

## 2016-09-28 NOTE — Progress Notes (Signed)
Patient transferred to 5M16 via bed without distress. Receiving RN at bedside. Nursing to continue to monitor.

## 2016-09-28 NOTE — Progress Notes (Signed)
PULMONARY / CRITICAL CARE MEDICINE   Name: [name] MRN: [id number] DOB: 1956-01-21    ADMISSION DATE:  09/27/2016 CONSULTATION DATE:  2/14  REFERRING MD:  Dr. Estanislado Pandy   CHIEF COMPLAINT:  AMS  Brief:   61 y.o. male with PMH of Anemia, CAD, DM, HTN, Hyperlipidemia, MI, and PE.   On 2/13 was admitted for right MCA. Given tPA then taken to neuro IR for revascularization. Post-procedure went to ICU and PCCM called for vent management.  SUBJECTIVE:  Off sedation, as been weaning since 7am  VITAL SIGNS: BP (!) 124/51 (BP Location: Right Arm)   Pulse (!) 47   Temp 98.7 F (37.1 C) (Axillary)   Resp (!) 21   Ht 5\' 9"  (1.753 m) Comment: Simultaneous filing. User may not have seen previous data.  Wt 80.8 kg (178 lb 2.1 oz)   SpO2 97%   BMI 26.31 kg/m   HEMODYNAMICS:    VENTILATOR SETTINGS: Vent Mode: PRVC FiO2 (%):  [40 %-60 %] 40 % Set Rate:  [16 bmp] 16 bmp Vt Set:  [520 mL-560 mL] 560 mL PEEP:  [5 cmH20] 5 cmH20 Pressure Support:  [8 cmH20] 8 cmH20 Plateau Pressure:  [12 cmH20-15 cmH20] 12 cmH20  INTAKE / OUTPUT: I/O last 3 completed shifts: In: 2625.4 [I.V.:2015.4; IV Piggyback:610] Out: [id number] [Urine:1260; Blood:400]  PHYSICAL EXAMINATION: General: Adult male, no distress, lying in bed  Neuro: Alert, moves all extremities, follows commands   HEENT: ETT in place   Cardiovascular: Loletha Grayer, no MRG, NI S1/S2 Lungs: Clear, non-labored Abdomen: non-distended, non-tender, active bowel sounds   Musculoskeletal: no deformities  Skin: warm, dry, intact    LABS:  BMET  Recent Labs Lab 09/27/16 1241 09/27/16 1809 09/28/16 0421  NA 142 140 139  K 4.3 4.5 4.1  CL 107 111 109  CO2  --  23 22  BUN 18 14 11   CREATININE 1.20 1.10 1.06  GLUCOSE 85 91 104*    Electrolytes  Recent Labs Lab 09/27/16 1809 09/28/16 0421  CALCIUM 8.1* 8.4*  MG 1.7  --   PHOS 4.1  --     CBC  Recent Labs Lab 09/27/16 1205 09/27/16 1241 09/28/16 0421  WBC 5.7  --  7.0   HGB 13.7 12.6* 11.3*  HCT 41.2 37.0* 33.3*  PLT 140*  --  156    Coag's  Recent Labs Lab 09/27/16 1205  APTT 29  INR 1.10    Sepsis Markers No results for input(s): LATICACIDVEN, PROCALCITON, O2SATVEN in the last 168 hours.  ABG  Recent Labs Lab 09/27/16 1836  PHART 7.312*  PCO2ART 45.4  PO2ART 279.0*    Liver Enzymes No results for input(s): AST, ALT, ALKPHOS, BILITOT, ALBUMIN in the last 168 hours.  Cardiac Enzymes  Recent Labs Lab 09/27/16 1809 09/28/16 0421  TROPONINI <0.03 0.03*    Glucose No results for input(s): GLUCAP in the last 168 hours.  Imaging Ct Angio Head W Or Wo Contrast  Result Date: 09/27/2016 CLINICAL DATA:  61 year old male code stroke with suspicion of right MCA occlusion on noncontrast head CT. Initial encounter. EXAM: CT ANGIOGRAPHY HEAD AND NECK TECHNIQUE: Multidetector CT imaging of the head and neck was performed using the standard protocol during bolus administration of intravenous contrast. Multiplanar CT image reconstructions and MIPs were obtained to evaluate the vascular anatomy. Carotid stenosis measurements (when applicable) are obtained utilizing NASCET criteria, using the distal internal carotid diameter as the denominator. CONTRAST:  50 mL Isovue 370 COMPARISON:  Noncontrast head  CT 1214 hours today. FINDINGS: CTA NECK Skeleton: Absent maxillary dentition. Lower cervical ACDF hardware. Previous median sternotomy. No acute osseous abnormality identified. Upper chest: Centrilobular and paraseptal emphysema. Previous CABG. No superior mediastinal lymphadenopathy. Other neck: Negative thyroid, larynx, pharynx, parapharyngeal spaces, retropharyngeal space, sublingual space, submandibular glands and parotid glands. No cervical lymphadenopathy. Aortic arch: 3 vessel arch configuration. Mild to moderate mostly distal arch calcified atherosclerosis. Right carotid system: Negative brachiocephalic artery and right CCA origin. No right CCA  stenosis. Calcified plaque at the right carotid bifurcation including in the right ICA origin and bulb, but no significant stenosis. Mildly tortuous cervical right ICA. Left carotid system: Negative left CCA origin. Calcified plaque along the anterior left CCA without stenosis proximal to the bifurcation. Bulky calcified plaque at the left carotid bifurcation resulting in high-grade left ICA origin stenosis (numerically estimated at 80 % with respect to the distal vessel) and approaching a RADIOGRAPHIC STRING SIGN. See series 5, image 120 and series 8, image 114. Calcified plaque continues in the left ICA bulb without additional stenosis. Vertebral arteries: No proximal right subclavian artery or right vertebral artery origins stenosis. Negative cervical right vertebral artery. Calcified plaque in the proximal left subclavian artery without stenosis. Bulky calcified plaque at the left vertebral artery origin resulting in mild to moderate origin stenosis (series 8, image 144). The left vertebral artery is mildly dominant, with no additional stenosis to the skullbase. CTA HEAD Posterior circulation: Dominant distal left vertebral artery primarily supplies the basilar. The non dominant distal right vertebral artery is diminutive beyond the right PICA origin. There is mild left V4 segment calcified plaque without distal left vertebral artery stenosis. Mild irregularity at the vertebrobasilar junction does not appear hemodynamically significant. Mildly tortuous basilar artery without stenosis. SCA and PCA origins are patent. Posterior communicating arteries are diminutive or absent. Bilateral PCA branches appear within normal limits. Anterior circulation: Both ICA siphons are patent. There is fairly extensive bilateral siphon calcified plaque, but this does not appear to be hemodynamically significant. Both carotid termini are patent. MCA and ACA origins are normal. Diminutive or absent anterior communicating artery.  Bilateral ACA branches, left MCA M1 segment, and left MCA branches appear within normal limits. The right MCA M1 segment is occluded 9-10 mm beyond its origin. See series 11, image 22 and series 9, image 18. While there is no enhancement at the right MCA bifurcationthere is some reconstitution of the right MCA branches as seen on series 9, image 16. Venous sinuses: Appear patent. Anatomic variants: Dominant left vertebral artery. Review of the MIP images confirms the above findings IMPRESSION: 1. 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 This was relayed via text pager to [facility] on 09/27/2016 at 1230 hours, and later we discussed by telephone. 2. Superimposed right carotid calcified atherosclerosis without hemodynamically significant stenosis. 3. However, there is High-grade (80%, approaching RADIOGRAPHIC STRING SIGN) stenosis at the left ICA origin. 4. Dominant left vertebral artery with up to moderate origin stenosis due to calcified plaque. Non dominant right vertebral artery functionally terminates in PICA. Electronically Signed   By: Genevie Ann M.D.   On: 09/27/2016 12:46   Ct Head Wo Contrast  Result Date: 09/28/2016 CLINICAL DATA:  Followup stroke. EXAM: CT HEAD WITHOUT CONTRAST TECHNIQUE: Contiguous axial images were obtained from the base of the skull through the vertex without intravenous contrast. COMPARISON:  CT HEAD September 27, 2016 FINDINGS: BRAIN: Residual contrast staining RIGHT insula with density in the RIGHT sylvian  fissure. Small infarct RIGHT posterior putaminal RIGHT inferior frontal lobe probable encephalomalacia. No definite intraparenchymal hemorrhage. No midline shift or mass effect. Ventricles are normal for patient's age. Basal cisterns are patent. VASCULAR: Moderate to severe calcific atherosclerosis of the carotid siphons. SKULL: No skull fracture. No significant scalp soft tissue swelling. SINUSES/ORBITS: RIGHT paranasal sinus mucosal thickening  with atresia compatible with chronic sinusitis. Trace LEFT mastoid effusion. The included ocular globes and orbital contents are non-suspicious. OTHER: Poor dentition. IMPRESSION: Acute RIGHT basal ganglia nonhemorrhagic infarct. Residual RIGHT contrast staining. RIGHT inferior frontal lobe encephalomalacia. Moderate to severe atherosclerosis. Electronically Signed   By: Elon Alas M.D.   On: 09/28/2016 05:32   Ct Head Wo Contrast  Result Date: 09/27/2016 CLINICAL DATA:  61 year old male with ELVO, right MCA occlusion status post endovascular reperfusion. Initial encounter. EXAM: CT HEAD WITHOUT CONTRAST TECHNIQUE: Contiguous axial images were obtained from the base of the skull through the vertex without intravenous contrast. COMPARISON:  Endovascular images earlier today. CTA head and neck 1227 hours today, and earlier. FINDINGS: Brain: Parenchymal staining in the posterior right temporal lobe, and some of the more posterior right MCA territory including the inferior right parietal lobe. No associated mass effect. No acute intracranial hemorrhage identified. No superimposed right MCA cytotoxic edema identified; chronic right inferior frontal gyrus encephalomalacia. No ventriculomegaly or intracranial mass effect. Vascular: Calcified atherosclerosis at the skull base. Residual intravascular contrast. Skull: No acute osseous abnormality identified. Sinuses/Orbits: Intubated. Stable mild right maxillary sinus mucosal thickening. Other: No acute orbit or scalp soft tissue findings. IMPRESSION: 1. Post endovascular Neuro-intervention parenchymal staining in the right temporal and parietal lobes. No superimposed cytotoxic edema, acute intracranial hemorrhage or mass effect identified. 2. Small area of chronic encephalomalacia in the right anterior insula / inferior frontal gyrus. Electronically Signed   By: Genevie Ann M.D.   On: 09/27/2016 16:39   Ct Angio Neck W Or Wo Contrast  Result Date:  09/27/2016 CLINICAL DATA:  61 year old male code stroke with suspicion of right MCA occlusion on noncontrast head CT. Initial encounter. EXAM: CT ANGIOGRAPHY HEAD AND NECK TECHNIQUE: Multidetector CT imaging of the head and neck was performed using the standard protocol during bolus administration of intravenous contrast. Multiplanar CT image reconstructions and MIPs were obtained to evaluate the vascular anatomy. Carotid stenosis measurements (when applicable) are obtained utilizing NASCET criteria, using the distal internal carotid diameter as the denominator. CONTRAST:  50 mL Isovue 370 COMPARISON:  Noncontrast head CT 1214 hours today. FINDINGS: CTA NECK Skeleton: Absent maxillary dentition. Lower cervical ACDF hardware. Previous median sternotomy. No acute osseous abnormality identified. Upper chest: Centrilobular and paraseptal emphysema. Previous CABG. No superior mediastinal lymphadenopathy. Other neck: Negative thyroid, larynx, pharynx, parapharyngeal spaces, retropharyngeal space, sublingual space, submandibular glands and parotid glands. No cervical lymphadenopathy. Aortic arch: 3 vessel arch configuration. Mild to moderate mostly distal arch calcified atherosclerosis. Right carotid system: Negative brachiocephalic artery and right CCA origin. No right CCA stenosis. Calcified plaque at the right carotid bifurcation including in the right ICA origin and bulb, but no significant stenosis. Mildly tortuous cervical right ICA. Left carotid system: Negative left CCA origin. Calcified plaque along the anterior left CCA without stenosis proximal to the bifurcation. Bulky calcified plaque at the left carotid bifurcation resulting in high-grade left ICA origin stenosis (numerically estimated at 80 % with respect to the distal vessel) and approaching a RADIOGRAPHIC STRING SIGN. See series 5, image 120 and series 8, image 114. Calcified plaque continues in the left ICA bulb without  additional stenosis. Vertebral  arteries: No proximal right subclavian artery or right vertebral artery origins stenosis. Negative cervical right vertebral artery. Calcified plaque in the proximal left subclavian artery without stenosis. Bulky calcified plaque at the left vertebral artery origin resulting in mild to moderate origin stenosis (series 8, image 144). The left vertebral artery is mildly dominant, with no additional stenosis to the skullbase. CTA HEAD Posterior circulation: Dominant distal left vertebral artery primarily supplies the basilar. The non dominant distal right vertebral artery is diminutive beyond the right PICA origin. There is mild left V4 segment calcified plaque without distal left vertebral artery stenosis. Mild irregularity at the vertebrobasilar junction does not appear hemodynamically significant. Mildly tortuous basilar artery without stenosis. SCA and PCA origins are patent. Posterior communicating arteries are diminutive or absent. Bilateral PCA branches appear within normal limits. Anterior circulation: Both ICA siphons are patent. There is fairly extensive bilateral siphon calcified plaque, but this does not appear to be hemodynamically significant. Both carotid termini are patent. MCA and ACA origins are normal. Diminutive or absent anterior communicating artery. Bilateral ACA branches, left MCA M1 segment, and left MCA branches appear within normal limits. The right MCA M1 segment is occluded 9-10 mm beyond its origin. See series 11, image 22 and series 9, image 18. While there is no enhancement at the right MCA bifurcationthere is some reconstitution of the right MCA branches as seen on series 9, image 16. Venous sinuses: Appear patent. Anatomic variants: Dominant left vertebral artery. Review of the MIP images confirms the above findings IMPRESSION: 1. 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 This was relayed via text pager to [facility] on 09/27/2016 at 1230 hours, and later  we discussed by telephone. 2. Superimposed right carotid calcified atherosclerosis without hemodynamically significant stenosis. 3. However, there is High-grade (80%, approaching RADIOGRAPHIC STRING SIGN) stenosis at the left ICA origin. 4. Dominant left vertebral artery with up to moderate origin stenosis due to calcified plaque. Non dominant right vertebral artery functionally terminates in PICA. Electronically Signed   By: Genevie Ann M.D.   On: 09/27/2016 12:46   Dg Chest Port 1 View  Result Date: 09/28/2016 CLINICAL DATA:  Respiratory failure. EXAM: PORTABLE CHEST 1 VIEW COMPARISON:  09/27/2016 . FINDINGS: Endotracheal tube in stable position. Prior CABG. Heart size stable. No focal infiltrate. No pleural effusion or pneumothorax. Prior cervical spine fusion. IMPRESSION: 1. Endotracheal tube in stable position. 2. Prior CABG. Heart size stable. No acute cardiopulmonary disease identified . Chest is stable from prior exam. Electronically Signed   By: Marcello Moores  Register   On: 09/28/2016 07:53   Dg Chest Port 1 View  Result Date: 09/27/2016 CLINICAL DATA:  Endotracheal tube placement.  Initial encounter. EXAM: PORTABLE CHEST 1 VIEW COMPARISON:  Chest radiograph performed 09/02/2013 FINDINGS: The patient's endotracheal tube is seen ending 5 cm above the carina. The lungs are well-aerated and clear. There is no evidence of focal opacification, pleural effusion or pneumothorax. The cardiomediastinal silhouette is normal in size. The patient is status post median sternotomy. No acute osseous abnormalities are seen. IMPRESSION: 1. Endotracheal tube seen ending 5 cm above the carina. 2. No acute cardiopulmonary process seen. Electronically Signed   By: Garald Balding M.D.   On: 09/27/2016 18:32   Ir Percutaneous Art Thrombectomy/infusion Intracranial Inc Diag Angio  Result Date: 09/27/2016 INDICATION: 61 year old male presents with emergent large vessel occlusion of the right MCA. Given his high NIH stroke scale,  baseline function, and within  the window for treatment, he would like to proceed with cerebral angiogram and mechanical thrombectomy. EXAM: IR PERCUTANEOUS ART THORMBECTOMY/INFUSION INTRACRANIAL INCLUDE DIAG ANGIO; IR ANGIO INTRA EXTRACRAN SEL COM CAROTID INNOMINATE UNI LEFT MOD SED COMPARISON:  Noncontrast head CT 09/27/2016, cerebral CT angiogram 09/27/2016 MEDICATIONS: 2.0 g Ancef. The antibiotic was administered within 1 hour of the procedure ANESTHESIA/SEDATION: General endotracheal anesthesia CONTRAST:  190 cc Isovue FLUOROSCOPY TIME:  Fluoroscopy Time: 86 minutes 0 seconds (4316 mGy). COMPLICATIONS: SIR Level A - No therapy, no consequence. TECHNIQUE: Two physicians were involved with this case. Case was initiated with Dr. Estanislado Pandy, and was completed by Dr. Earleen Newport. Both physicians were present for the informed written consent, which was obtained from the patient after a thorough discussion of the procedural risks, benefits and alternatives. Specific risks discussed include: Bleeding, infection, contrast reaction, kidney injury/failure, need for further procedure/surgery, arterial injury or dissection, embolization to new territory, intracranial hemorrhage (10-15% risk), neurologic deterioration, cardiopulmonary collapse, death. All questions were addressed. Maximal Sterile Barrier Technique was utilized including during the procedure including caps, mask, sterile gowns, sterile gloves, sterile drape, hand hygiene and skin antiseptic. A timeout was performed prior to the initiation of the procedure. Dr. Estanislado Pandy initiated the procedure. Eleven blade was used to make a small incision at the skin site in the right inguinal region. A micropuncture needle was used access the right common femoral artery. With excellent arterial blood flow returned, an .018 micro wire was passed through the needle, observed to enter the abdominal aorta under fluoroscopy. The needle was removed, and a micropuncture sheath was  placed over the wire. The inner dilator and wire were removed, and an 035 wire was advanced under fluoroscopy into the abdominal aorta. The sheath was removed and a standard 5 [place] vascular sheath was placed. The dilator was removed and the sheath was flushed. A 105F JB-1 diagnostic catheter was advanced over the wire to the proximal descending thoracic aorta. Wire was then removed. Double flush of the catheter was performed. Catheter was then used to select the innominate artery. Angiogram was performed. Using roadmap technique, the catheter was advanced over a roadrunner wire into right common carotid artery. Formal angiogram was performed. Exchange length Rosen wire was then passed through the diagnostic catheter to the distal common carotid artery and the diagnostic catheter was removed. The 5 French sheath was removed and exchanged for 8 French 55 centimeter BrightTip sheath. Sheath was flushed and attached to pressurized and heparinized saline bag for constant forward flow. Then an 8 [place], 85 cm Flowgate balloon tip catheter was prepared on the back table with inflation of the balloon with 50/50 concentration of dilute contrast. The balloon catheter was then advanced over the wire, positioned into the distal common carotid artery. Copious back flush was performed and the balloon catheter was attached to heparinized and pressurized saline bag for forward flow. Flowgate balloon catheter was then advanced over the road runner wire into the distal internal carotid artery. Angiogram was performed for road map guide. Microcatheter system was then introduced through an intermediate catheter, Ace 64, and the balloon guide catheter, using a synchro soft 014 wire and a Trevo Provue18 catheter. Microcatheter and intermediate catheter system was advanced into the internal carotid artery, to the level of the occlusion. The micro wire was then carefully advanced through the occluded segment. Microcatheter was then push  through the occluded segment and the wire was removed. Blood was then aspirated through the hub of the microcatheter, and a gentle contrast  injection was performed confirming intraluminal position. A rotating hemostatic valve was then attached to the back end of the microcatheter, and a pressurized and heparinized saline bag was attached to the catheter. 4 x 40 solitaire device was then selected. Back flush was achieved at the rotating hemostatic valve, and then the device was gently advanced through the microcatheter to the distal end. The retriever was then unsheathed by withdrawing the microcatheter under fluoroscopy. Once the retriever was completely unsheathed, control angiogram was performed from the balloon catheter. The intermediate Ace 64 catheter was then advanced into the middle cerebral artery for local aspiration. Aspiration was then performed at the tip of the Ace catheter as the retriever was gently and slowly withdrawn with fluoroscopic observation. Once the retriever was entirely removed from the system, free aspiration was confirmed at the hub of the intermediate catheter, with free blood return confirmed. Control angiogram was performed. Given incomplete reperfusion, another attempt was initiated. Has the microcatheter system was advanced under road map fluoroscopic guidance into the right middle cerebral artery, it was recognized that there was prolapse of the system into the aortic arch. The entire balloon catheter, intermediate catheter, microcatheter system had prolapsed into the aortic arch and require removal. Dr Earleen Newport was then present for the remainder of the case. Once the system was double flushed, a JB 1 catheter was advanced into the aortic arch and used to select the common carotid artery. Control angiogram was performed. The catheter was then used to select the common carotid artery with control run performed. The JB 1 catheter and roadrunner wire were then advanced into the proximal  internal carotid artery, and after confirming location with a control angiogram, the wire was removed. Rosen wire was then placed into the internal carotid artery and the JB catheter was removed. Again, 8 coaxial system of microcatheter, intermediate Ace 64 catheter, and the balloon guide were advanced over the Rosen wire into the internal carotid artery. Wire was removed and once the system was aspirated and flushed with pressurized heparin bags attached, a control angiogram was performed. The microcatheter and micro wire were then advanced across the occlusion for a second thrombectomy attempt. The second attempt was also local aspiration through the penumbra catheter and use of 4 x 40 solitaire stentreiver. Control angiogram demonstrated proximal embolization of the distal MCA with decreased TICI perfusion. Third attempt was performed using similar technique, local aspiration and solitaire 4 x 40 stentriever. In total 5 passes were made with local aspiration via Ace 64 catheter and 4 x 40 stentriever. The final and sixth pass was performed to re-establish flow through a dominant insular branch, using a 4 x 30 Trevo stentriever and a 64 aspiration. Trevo aspiration with proximal flow control (TRAP) technique was used on the final pass, with reestablished near complete, TICI 2b flow. Catheter and wires were removed from the right-sided cerebral circulation. JB 1 catheter was readvanced to the aortic arch, and angiogram of the left carotid system was performed including cerebral images. JB 1 catheter was removed. Control angiogram was performed at the right common femoral artery puncture site. The 8 French sheath was exchanged for a standard 9 French sheath at the common femoral artery puncture site. Patient tolerated the procedure well and remained hemodynamically stable throughout. No complications were encountered and no significant blood loss encountered. FINDINGS: Initial cerebral angiogram Right common carotid  artery: Normal course caliber and contour. Mild atherosclerotic changes at the right carotid bulb with no significant stenosis. Minimal tortuosity of the  right ICA. Right external carotid artery: Patent with antegrade flow. Right internal carotid artery: Minimal tortuosity of the right internal carotid. Right MCA: Occlusion of the proximal right MCA, with no significant plaque present at the cavernous segment, ophthalmic segment, to the terminus. Ophthalmic artery patent. Posterior communicating artery patent. There is a temporal branch patent just proximal to the MCA occlusion. At the initiation of the case there is no significant collateral flow to the right MCA territory secondary to [facility] branches. Right ACA: A 1 segment patent. A 2 segment perfuses the right sided territory. No significant collateral filling of the right MCA territory upon initiation of the case. After the first pass, there is TICI 2b established. Persistent occlusion of a proximal insular branch with absence of MCA cortical branches. After the second pass, there was more proximal embolization of the clot, with decreased perfusion to the right MCA territory. TICI 1 Third - fifth passes were useful in reestablishing TICI 2a flow. The final and sixth pass targeting large proximal insular branch occlusion was successful in reestablishing TICI 2b flow. Left common carotid artery:  Normal course caliber and contour. Left external carotid artery: Patent with antegrade flow. Left internal carotid artery: Moderate atherosclerotic changes at the left carotid bulb with no significant narrowing on the lateral view and the anterior view demonstrating approximately 58%. No significant tortuosity of the left carotid system. Left MCA: M1 segment patent. Insular and opercular segments patent. Unremarkable caliber and course of the cortical segments. Typical arterial, capillary/ parenchymal, and venous phase. Left ACA: A 1 segment patent. A 2 segment perfuses the  left territory. IMPRESSION: Status post cerebral angiogram with mechanical thrombectomy of proximal right MCA occlusion and reestablished near-complete, TICI 2b flow to the right hemisphere. Left cerebral angiogram demonstrating adequate perfusion. Left cervical angiogram demonstrates moderate carotid bulb disease, measuring approximately 58% stenosis. Signed, Dulcy Fanny. Earleen Newport, DO [facility] Radiology Electronically Signed   By: Corrie Mckusick D.O.   On: 09/27/2016 17:56   Ct Head Code Stroke W/o Cm  Result Date: 09/27/2016 CLINICAL DATA:  Code stroke. 61 year old male with left side weakness and slurred speech. Initial encounter. EXAM: CT HEAD WITHOUT CONTRAST TECHNIQUE: Contiguous axial images were obtained from the base of the skull through the vertex without intravenous contrast. COMPARISON:  Cervical spine MRI 03/30/2014 FINDINGS: Brain: No acute intracranial hemorrhage identified. No midline shift, mass effect, or evidence of intracranial mass lesion. 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 There is some associated right external capsule hypodensity. No superimposed acute cortically based infarct changes are identified. No other cortical encephalomalacia. Gray-white matter differentiation outside of the area above appears within normal limits. No ventriculomegaly. Vascular: Hyperdense right MCA (series 4, image 27). Skull: No acute osseous abnormality identified. Sinuses/Orbits: Mild mastoid effusion. Adenoid hypertrophy. Paranasal sinuses are clear except for right maxillary mucosal thickening. Other: Mild rightward gaze deviation. Negative orbit and scalp soft tissues otherwise. ASPECTS [facility] Stroke Program Early CT Score) - Ganglionic level infarction (caudate, lentiform nuclei, internal capsule, insula, M1-M3 cortex): 7 - Supraganglionic infarction  (M4-M6 cortex): 3 Total score (0-10 with 10 being normal): 10 IMPRESSION: 1. Positive for hyperdense right MCA suspicious for emergent large vessel occlusion. No acute intracranial hemorrhage or changes of acute cortically based infarct are identified. 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 2. ASPECTS is 10. 3. Study discussed by  telephone with Dr. Lawana Pai on 09/27/2016 at 1225 hours. CTA is pending. Electronically Signed   By: Genevie Ann M.D.   On: 09/27/2016 12:28   Ir Angio Intra Extracran Sel Com Carotid Innominate Uni L Mod Sed  Result Date: 09/27/2016 INDICATION: 61 year old male presents with emergent large vessel occlusion of the right MCA. Given his high NIH stroke scale, baseline function, and within the window for treatment, he would like to proceed with cerebral angiogram and mechanical thrombectomy. EXAM: IR PERCUTANEOUS ART THORMBECTOMY/INFUSION INTRACRANIAL INCLUDE DIAG ANGIO; IR ANGIO INTRA EXTRACRAN SEL COM CAROTID INNOMINATE UNI LEFT MOD SED COMPARISON:  Noncontrast head CT 09/27/2016, cerebral CT angiogram 09/27/2016 MEDICATIONS: 2.0 g Ancef. The antibiotic was administered within 1 hour of the procedure ANESTHESIA/SEDATION: General endotracheal anesthesia CONTRAST:  190 cc Isovue FLUOROSCOPY TIME:  Fluoroscopy Time: 86 minutes 0 seconds (4316 mGy). COMPLICATIONS: SIR Level A - No therapy, no consequence. TECHNIQUE: Two physicians were involved with this case. Case was initiated with Dr. Estanislado Pandy, and was completed by Dr. Earleen Newport. Both physicians were present for the informed written consent, which was obtained from the patient after a thorough discussion of the procedural risks, benefits and alternatives. Specific risks discussed include: Bleeding, infection, contrast reaction, kidney injury/failure, need for further procedure/surgery, arterial injury or dissection, embolization to new territory, intracranial hemorrhage (10-15% risk),  neurologic deterioration, cardiopulmonary collapse, death. All questions were addressed. Maximal Sterile Barrier Technique was utilized including during the procedure including caps, mask, sterile gowns, sterile gloves, sterile drape, hand hygiene and skin antiseptic. A timeout was performed prior to the initiation of the procedure. Dr. Estanislado Pandy initiated the procedure. Eleven blade was used to make a small incision at the skin site in the right inguinal region. A micropuncture needle was used access the right common femoral artery. With excellent arterial blood flow returned, an .018 micro wire was passed through the needle, observed to enter the abdominal aorta under fluoroscopy. The needle was removed, and a micropuncture sheath was placed over the wire. The inner dilator and wire were removed, and an 035 wire was advanced under fluoroscopy into the abdominal aorta. The sheath was removed and a standard 5 [place] vascular sheath was placed. The dilator was removed and the sheath was flushed. A 31F JB-1 diagnostic catheter was advanced over the wire to the proximal descending thoracic aorta. Wire was then removed. Double flush of the catheter was performed. Catheter was then used to select the innominate artery. Angiogram was performed. Using roadmap technique, the catheter was advanced over a roadrunner wire into right common carotid artery. Formal angiogram was performed. Exchange length Rosen wire was then passed through the diagnostic catheter to the distal common carotid artery and the diagnostic catheter was removed. The 5 French sheath was removed and exchanged for 8 French 55 centimeter BrightTip sheath. Sheath was flushed and attached to pressurized and heparinized saline bag for constant forward flow. Then an 8 [place], 85 cm Flowgate balloon tip catheter was prepared on the back table with inflation of the balloon with 50/50 concentration of dilute contrast. The balloon catheter was then advanced over the  wire, positioned into the distal common carotid artery. Copious back flush was performed and the balloon catheter was attached to heparinized and pressurized saline bag for forward flow. Flowgate balloon catheter was then advanced over the road runner wire into the distal internal carotid artery. Angiogram was performed for road map guide. Microcatheter system was then introduced through an intermediate catheter, Ace 64, and the balloon guide catheter,  using a synchro soft 014 wire and a Trevo Provue18 catheter. Microcatheter and intermediate catheter system was advanced into the internal carotid artery, to the level of the occlusion. The micro wire was then carefully advanced through the occluded segment. Microcatheter was then push through the occluded segment and the wire was removed. Blood was then aspirated through the hub of the microcatheter, and a gentle contrast injection was performed confirming intraluminal position. A rotating hemostatic valve was then attached to the back end of the microcatheter, and a pressurized and heparinized saline bag was attached to the catheter. 4 x 40 solitaire device was then selected. Back flush was achieved at the rotating hemostatic valve, and then the device was gently advanced through the microcatheter to the distal end. The retriever was then unsheathed by withdrawing the microcatheter under fluoroscopy. Once the retriever was completely unsheathed, control angiogram was performed from the balloon catheter. The intermediate Ace 64 catheter was then advanced into the middle cerebral artery for local aspiration. Aspiration was then performed at the tip of the Ace catheter as the retriever was gently and slowly withdrawn with fluoroscopic observation. Once the retriever was entirely removed from the system, free aspiration was confirmed at the hub of the intermediate catheter, with free blood return confirmed. Control angiogram was performed. Given incomplete reperfusion,  another attempt was initiated. Has the microcatheter system was advanced under road map fluoroscopic guidance into the right middle cerebral artery, it was recognized that there was prolapse of the system into the aortic arch. The entire balloon catheter, intermediate catheter, microcatheter system had prolapsed into the aortic arch and require removal. Dr Earleen Newport was then present for the remainder of the case. Once the system was double flushed, a JB 1 catheter was advanced into the aortic arch and used to select the common carotid artery. Control angiogram was performed. The catheter was then used to select the common carotid artery with control run performed. The JB 1 catheter and roadrunner wire were then advanced into the proximal internal carotid artery, and after confirming location with a control angiogram, the wire was removed. Rosen wire was then placed into the internal carotid artery and the JB catheter was removed. Again, 8 coaxial system of microcatheter, intermediate Ace 64 catheter, and the balloon guide were advanced over the Rosen wire into the internal carotid artery. Wire was removed and once the system was aspirated and flushed with pressurized heparin bags attached, a control angiogram was performed. The microcatheter and micro wire were then advanced across the occlusion for a second thrombectomy attempt. The second attempt was also local aspiration through the penumbra catheter and use of 4 x 40 solitaire stentreiver. Control angiogram demonstrated proximal embolization of the distal MCA with decreased TICI perfusion. Third attempt was performed using similar technique, local aspiration and solitaire 4 x 40 stentriever. In total 5 passes were made with local aspiration via Ace 64 catheter and 4 x 40 stentriever. The final and sixth pass was performed to re-establish flow through a dominant insular branch, using a 4 x 30 Trevo stentriever and a 64 aspiration. Trevo aspiration with proximal flow  control (TRAP) technique was used on the final pass, with reestablished near complete, TICI 2b flow. Catheter and wires were removed from the right-sided cerebral circulation. JB 1 catheter was readvanced to the aortic arch, and angiogram of the left carotid system was performed including cerebral images. JB 1 catheter was removed. Control angiogram was performed at the right common femoral artery puncture site. The  8 French sheath was exchanged for a standard 9 French sheath at the common femoral artery puncture site. Patient tolerated the procedure well and remained hemodynamically stable throughout. No complications were encountered and no significant blood loss encountered. FINDINGS: Initial cerebral angiogram Right common carotid artery: Normal course caliber and contour. Mild atherosclerotic changes at the right carotid bulb with no significant stenosis. Minimal tortuosity of the right ICA. Right external carotid artery: Patent with antegrade flow. Right internal carotid artery: Minimal tortuosity of the right internal carotid. Right MCA: Occlusion of the proximal right MCA, with no significant plaque present at the cavernous segment, ophthalmic segment, to the terminus. Ophthalmic artery patent. Posterior communicating artery patent. There is a temporal branch patent just proximal to the MCA occlusion. At the initiation of the case there is no significant collateral flow to the right MCA territory secondary to [facility] branches. Right ACA: A 1 segment patent. A 2 segment perfuses the right sided territory. No significant collateral filling of the right MCA territory upon initiation of the case. After the first pass, there is TICI 2b established. Persistent occlusion of a proximal insular branch with absence of MCA cortical branches. After the second pass, there was more proximal embolization of the clot, with decreased perfusion to the right MCA territory. TICI 1 Third - fifth passes were useful in reestablishing  TICI 2a flow. The final and sixth pass targeting large proximal insular branch occlusion was successful in reestablishing TICI 2b flow. Left common carotid artery:  Normal course caliber and contour. Left external carotid artery: Patent with antegrade flow. Left internal carotid artery: Moderate atherosclerotic changes at the left carotid bulb with no significant narrowing on the lateral view and the anterior view demonstrating approximately 58%. No significant tortuosity of the left carotid system. Left MCA: M1 segment patent. Insular and opercular segments patent. Unremarkable caliber and course of the cortical segments. Typical arterial, capillary/ parenchymal, and venous phase. Left ACA: A 1 segment patent. A 2 segment perfuses the left territory. IMPRESSION: Status post cerebral angiogram with mechanical thrombectomy of proximal right MCA occlusion and reestablished near-complete, TICI 2b flow to the right hemisphere. Left cerebral angiogram demonstrating adequate perfusion. Left cervical angiogram demonstrates moderate carotid bulb disease, measuring approximately 58% stenosis. Signed, Dulcy Fanny. Earleen Newport, DO [facility] Radiology Electronically Signed   By: Corrie Mckusick D.O.   On: 09/27/2016 17:56    STUDIES:  CT head 02/13 > right MCA occlusion, high grade left ICA stenosis. Echo 2/14 >  CT head 2/14 > Acute Right basal ganglia nonhemorrhagic infarct. Right inferior frontal lobe encephalomalacia  MRI brain 2/14 >   SIGNIFICANT EVENTS: 02/13 > admit.  LINES/TUBES: ETT 02/13 > L rad a line 02/13 > Sheath R Fem 2/13 >  DISCUSSION: 61 y.o. male with acute right MCA infarct.  Received tPA then taken for IR revascularization. Remained intubated post procedure.   ASSESSMENT / PLAN:  PULMONARY A: Vent Dependent - secondary to CVA Tobacco dependence. Hx PE. P:   Extubate Now Pulmonary Hygiene > IS, flutter valve  Maintain oxygenation  >92  CARDIOVASCULAR A:  Bradycarda  HTN Hx MI, CAD, HTN, HLD P:  Cardiac Monitoring  Maintain SBP 120-140 Continue simvastatin  ECHO pending  Hold preadmission aspirin, toprol-xl, nitro   RENAL A:   No issues  P:   Trend BMP Replace electrolytes as needed  GASTROINTESTINAL A:   Dysphagia  P:   Swallow Evaluation After extubation   HEMATOLOGIC A:   VTE  Prophylaxis  P:  SCDS Trend CBC  INFECTIOUS A:   No issues  P:   Trend fever and WBC curve   ENDOCRINE A:   No issues  P:   Trend Glucose   NEUROLOGIC A:   Right MCA - S/P tPA and IR revascularization  P:   Per Neurology/Neurosurgery  PT/OT/ST D/C Sedation  MRI Pending  RASS goal: 0    FAMILY  - Updates: Family updated at bedside 2/14   - Inter-disciplinary family meet or Palliative Care meeting due by: 2/20    Hayden Pedro, AG-ACNP [facility]  Pgr: [phone]  PCCM Pgr: [phone]  ATTENDING NOTE / ATTESTATION NOTE :   I have discussed the case with the resident/APP  Hayden Pedro NP  I agree with the resident/APP's  history, physical examination, assessment, and plans.    I have edited the above note and modified it according to our agreed history, physical examination, assessment and plan.   Briefly, 61 y.o. male with PMH of Anemia, CAD, DM, HTN, Hyperlipidemia, MI, and PE.  admitted for right MCA. Given tPA then taken to neuro IR for revascularization. Post-procedure went to ICU and PCCM called for vent management.  This morning, pt seen awake, following commands.  Doing well on PST.  (-) issues overnight.   Vitals:  Vitals:   09/28/16 0700 09/28/16 0724 09/28/16 0800 09/28/16 0900  BP: (!) 122/59 (!) 122/59 (!) 124/51 (!) 127/59  Pulse: (!) 54 63 (!) 47 (!) 48  Resp: (!) 24 (!) 22 (!) 21 (!) 21  Temp:   98.7 F (37.1 C)   TempSrc:   Axillary   SpO2: 97% 98% 97% 97%  Weight:      Height:        Constitutional/General: well-nourished,  well-developed, intubated, doing well on PST. Comfortable.   Body mass index is 26.31 kg/m. Wt Readings from Last 3 Encounters:  09/27/16 80.8 kg (178 lb 2.1 oz)  02/09/16 84.1 kg (185 lb 8 oz)  11/12/15 89.2 kg (196 lb 12 oz)    HEENT: PERLA, anicteric sclerae. (-) Oral thrush. Intubated, ETT in place  Neck: No masses. Midline trachea. No JVD, (-) LAD. (-) bruits appreciated.  Respiratory/Chest: Grossly normal chest. (-) deformity. (-) Accessory muscle use.  Symmetric expansion. Diminished BS on both lower lung zones. (-) wheezing, crackles, rhonchi (-) egophony  Cardiovascular: Regular rate and  rhythm, heart sounds normal, no murmur or gallops,  (-) peripheral edema  Gastrointestinal:  Normal bowel sounds. Soft, non-tender. No hepatosplenomegaly.  (-) masses.   Musculoskeletal:  Normal muscle tone.   Extremities: Grossly normal. (-) clubbing, cyanosis.  (-) edema  Skin: (-) rash,lesions seen.   Neurological/Psychiatric : sedated, intubated. CN grossly intact. (-) lateralizing signs.     CBC Recent Labs     09/27/16  1205  09/27/16  1241  09/28/16  0421  WBC  5.7   --   7.0  HGB  13.7  12.6*  11.3*  HCT  41.2  37.0*  33.3*  PLT  140*   --   156    Coag's Recent Labs     09/27/16  1205  APTT  29  INR  1.10    BMET Recent Labs     09/27/16  1241  09/27/16  1809  09/28/16  0421  NA  142  140  139  K  4.3  4.5  4.1  CL  107  111  109  CO2   --  23  22  BUN  18  14  11   CREATININE  1.20  1.10  1.06  GLUCOSE  85  91  104*    Electrolytes Recent Labs     09/27/16  1809  09/28/16  0421  CALCIUM  8.1*  8.4*  MG  1.7   --   PHOS  4.1   --     Sepsis Markers No results for input(s): PROCALCITON, O2SATVEN in the last 72 hours.  Invalid input(s): LACTICACIDVEN  ABG Recent Labs     09/27/16  1836  PHART  7.312*  PCO2ART  45.4  PO2ART  279.0*    Liver Enzymes No results for input(s): AST, ALT, ALKPHOS, BILITOT, ALBUMIN in the last 72  hours.  Cardiac Enzymes Recent Labs     09/27/16  1809  09/28/16  0421  TROPONINI  <0.03  0.03*    Glucose No results for input(s): GLUCAP in the last 72 hours.  Imaging Ct Angio Head W Or Wo Contrast  Result Date: 09/27/2016 CLINICAL DATA:  61 year old male code stroke with suspicion of right MCA occlusion on noncontrast head CT. Initial encounter. EXAM: CT ANGIOGRAPHY HEAD AND NECK TECHNIQUE: Multidetector CT imaging of the head and neck was performed using the standard protocol during bolus administration of intravenous contrast. Multiplanar CT image reconstructions and MIPs were obtained to evaluate the vascular anatomy. Carotid stenosis measurements (when applicable) are obtained utilizing NASCET criteria, using the distal internal carotid diameter as the denominator. CONTRAST:  50 mL Isovue 370 COMPARISON:  Noncontrast head CT 1214 hours today. FINDINGS: CTA NECK Skeleton: Absent maxillary dentition. Lower cervical ACDF hardware. Previous median sternotomy. No acute osseous abnormality identified. Upper chest: Centrilobular and paraseptal emphysema. Previous CABG. No superior mediastinal lymphadenopathy. Other neck: Negative thyroid, larynx, pharynx, parapharyngeal spaces, retropharyngeal space, sublingual space, submandibular glands and parotid glands. No cervical lymphadenopathy. Aortic arch: 3 vessel arch configuration. Mild to moderate mostly distal arch calcified atherosclerosis. Right carotid system: Negative brachiocephalic artery and right CCA origin. No right CCA stenosis. Calcified plaque at the right carotid bifurcation including in the right ICA origin and bulb, but no significant stenosis. Mildly tortuous cervical right ICA. Left carotid system: Negative left CCA origin. Calcified plaque along the anterior left CCA without stenosis proximal to the bifurcation. Bulky calcified plaque at the left carotid bifurcation resulting in high-grade left ICA origin stenosis (numerically  estimated at 80 % with respect to the distal vessel) and approaching a RADIOGRAPHIC STRING SIGN. See series 5, image 120 and series 8, image 114. Calcified plaque continues in the left ICA bulb without additional stenosis. Vertebral arteries: No proximal right subclavian artery or right vertebral artery origins stenosis. Negative cervical right vertebral artery. Calcified plaque in the proximal left subclavian artery without stenosis. Bulky calcified plaque at the left vertebral artery origin resulting in mild to moderate origin stenosis (series 8, image 144). The left vertebral artery is mildly dominant, with no additional stenosis to the skullbase. CTA HEAD Posterior circulation: Dominant distal left vertebral artery primarily supplies the basilar. The non dominant distal right vertebral artery is diminutive beyond the right PICA origin. There is mild left V4 segment calcified plaque without distal left vertebral artery stenosis. Mild irregularity at the vertebrobasilar junction does not appear hemodynamically significant. Mildly tortuous basilar artery without stenosis. SCA and PCA origins are patent. Posterior communicating arteries are diminutive or absent. Bilateral PCA branches appear within normal limits. Anterior circulation: Both ICA siphons are patent. There is fairly extensive bilateral siphon  calcified plaque, but this does not appear to be hemodynamically significant. Both carotid termini are patent. MCA and ACA origins are normal. Diminutive or absent anterior communicating artery. Bilateral ACA branches, left MCA M1 segment, and left MCA branches appear within normal limits. The right MCA M1 segment is occluded 9-10 mm beyond its origin. See series 11, image 22 and series 9, image 18. While there is no enhancement at the right MCA bifurcationthere is some reconstitution of the right MCA branches as seen on series 9, image 16. Venous sinuses: Appear patent. Anatomic variants: Dominant left vertebral  artery. Review of the MIP images confirms the above findings IMPRESSION: 1. 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 This was relayed via text pager to [facility] on 09/27/2016 at 1230 hours, and later we discussed by telephone. 2. Superimposed right carotid calcified atherosclerosis without hemodynamically significant stenosis. 3. However, there is High-grade (80%, approaching RADIOGRAPHIC STRING SIGN) stenosis at the left ICA origin. 4. Dominant left vertebral artery with up to moderate origin stenosis due to calcified plaque. Non dominant right vertebral artery functionally terminates in PICA. Electronically Signed   By: Genevie Ann M.D.   On: 09/27/2016 12:46   Ct Head Wo Contrast  Result Date: 09/28/2016 CLINICAL DATA:  Followup stroke. EXAM: CT HEAD WITHOUT CONTRAST TECHNIQUE: Contiguous axial images were obtained from the base of the skull through the vertex without intravenous contrast. COMPARISON:  CT HEAD September 27, 2016 FINDINGS: BRAIN: Residual contrast staining RIGHT insula with density in the RIGHT sylvian fissure. Small infarct RIGHT posterior putaminal RIGHT inferior frontal lobe probable encephalomalacia. No definite intraparenchymal hemorrhage. No midline shift or mass effect. Ventricles are normal for patient's age. Basal cisterns are patent. VASCULAR: Moderate to severe calcific atherosclerosis of the carotid siphons. SKULL: No skull fracture. No significant scalp soft tissue swelling. SINUSES/ORBITS: RIGHT paranasal sinus mucosal thickening with atresia compatible with chronic sinusitis. Trace LEFT mastoid effusion. The included ocular globes and orbital contents are non-suspicious. OTHER: Poor dentition. IMPRESSION: Acute RIGHT basal ganglia nonhemorrhagic infarct. Residual RIGHT contrast staining. RIGHT inferior frontal lobe encephalomalacia. Moderate to severe atherosclerosis. Electronically Signed   By: Elon Alas M.D.   On: 09/28/2016 05:32   Ct  Head Wo Contrast  Result Date: 09/27/2016 CLINICAL DATA:  61 year old male with ELVO, right MCA occlusion status post endovascular reperfusion. Initial encounter. EXAM: CT HEAD WITHOUT CONTRAST TECHNIQUE: Contiguous axial images were obtained from the base of the skull through the vertex without intravenous contrast. COMPARISON:  Endovascular images earlier today. CTA head and neck 1227 hours today, and earlier. FINDINGS: Brain: Parenchymal staining in the posterior right temporal lobe, and some of the more posterior right MCA territory including the inferior right parietal lobe. No associated mass effect. No acute intracranial hemorrhage identified. No superimposed right MCA cytotoxic edema identified; chronic right inferior frontal gyrus encephalomalacia. No ventriculomegaly or intracranial mass effect. Vascular: Calcified atherosclerosis at the skull base. Residual intravascular contrast. Skull: No acute osseous abnormality identified. Sinuses/Orbits: Intubated. Stable mild right maxillary sinus mucosal thickening. Other: No acute orbit or scalp soft tissue findings. IMPRESSION: 1. Post endovascular Neuro-intervention parenchymal staining in the right temporal and parietal lobes. No superimposed cytotoxic edema, acute intracranial hemorrhage or mass effect identified. 2. Small area of chronic encephalomalacia in the right anterior insula / inferior frontal gyrus. Electronically Signed   By: Genevie Ann M.D.   On: 09/27/2016 16:39   Ct Angio Neck W Or Wo Contrast  Result Date: 09/27/2016 CLINICAL  DATA:  61 year old male code stroke with suspicion of right MCA occlusion on noncontrast head CT. Initial encounter. EXAM: CT ANGIOGRAPHY HEAD AND NECK TECHNIQUE: Multidetector CT imaging of the head and neck was performed using the standard protocol during bolus administration of intravenous contrast. Multiplanar CT image reconstructions and MIPs were obtained to evaluate the vascular anatomy. Carotid stenosis  measurements (when applicable) are obtained utilizing NASCET criteria, using the distal internal carotid diameter as the denominator. CONTRAST:  50 mL Isovue 370 COMPARISON:  Noncontrast head CT 1214 hours today. FINDINGS: CTA NECK Skeleton: Absent maxillary dentition. Lower cervical ACDF hardware. Previous median sternotomy. No acute osseous abnormality identified. Upper chest: Centrilobular and paraseptal emphysema. Previous CABG. No superior mediastinal lymphadenopathy. Other neck: Negative thyroid, larynx, pharynx, parapharyngeal spaces, retropharyngeal space, sublingual space, submandibular glands and parotid glands. No cervical lymphadenopathy. Aortic arch: 3 vessel arch configuration. Mild to moderate mostly distal arch calcified atherosclerosis. Right carotid system: Negative brachiocephalic artery and right CCA origin. No right CCA stenosis. Calcified plaque at the right carotid bifurcation including in the right ICA origin and bulb, but no significant stenosis. Mildly tortuous cervical right ICA. Left carotid system: Negative left CCA origin. Calcified plaque along the anterior left CCA without stenosis proximal to the bifurcation. Bulky calcified plaque at the left carotid bifurcation resulting in high-grade left ICA origin stenosis (numerically estimated at 80 % with respect to the distal vessel) and approaching a RADIOGRAPHIC STRING SIGN. See series 5, image 120 and series 8, image 114. Calcified plaque continues in the left ICA bulb without additional stenosis. Vertebral arteries: No proximal right subclavian artery or right vertebral artery origins stenosis. Negative cervical right vertebral artery. Calcified plaque in the proximal left subclavian artery without stenosis. Bulky calcified plaque at the left vertebral artery origin resulting in mild to moderate origin stenosis (series 8, image 144). The left vertebral artery is mildly dominant, with no additional stenosis to the skullbase. CTA HEAD  Posterior circulation: Dominant distal left vertebral artery primarily supplies the basilar. The non dominant distal right vertebral artery is diminutive beyond the right PICA origin. There is mild left V4 segment calcified plaque without distal left vertebral artery stenosis. Mild irregularity at the vertebrobasilar junction does not appear hemodynamically significant. Mildly tortuous basilar artery without stenosis. SCA and PCA origins are patent. Posterior communicating arteries are diminutive or absent. Bilateral PCA branches appear within normal limits. Anterior circulation: Both ICA siphons are patent. There is fairly extensive bilateral siphon calcified plaque, but this does not appear to be hemodynamically significant. Both carotid termini are patent. MCA and ACA origins are normal. Diminutive or absent anterior communicating artery. Bilateral ACA branches, left MCA M1 segment, and left MCA branches appear within normal limits. The right MCA M1 segment is occluded 9-10 mm beyond its origin. See series 11, image 22 and series 9, image 18. While there is no enhancement at the right MCA bifurcationthere is some reconstitution of the right MCA branches as seen on series 9, image 16. Venous sinuses: Appear patent. Anatomic variants: Dominant left vertebral artery. Review of the MIP images confirms the above findings IMPRESSION: 1. 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 This was relayed via text pager to [facility] on 09/27/2016 at 1230 hours, and later we discussed by telephone. 2. Superimposed right carotid calcified atherosclerosis without hemodynamically significant stenosis. 3. However, there is High-grade (80%, approaching RADIOGRAPHIC STRING SIGN) stenosis at the left ICA origin. 4. Dominant left vertebral artery with up to moderate  origin stenosis due to calcified plaque. Non dominant right vertebral artery functionally terminates in PICA. Electronically Signed   By: Genevie Ann M.D.   On: 09/27/2016 12:46   Dg Chest Port 1 View  Result Date: 09/28/2016 CLINICAL DATA:  Respiratory failure. EXAM: PORTABLE CHEST 1 VIEW COMPARISON:  09/27/2016 . FINDINGS: Endotracheal tube in stable position. Prior CABG. Heart size stable. No focal infiltrate. No pleural effusion or pneumothorax. Prior cervical spine fusion. IMPRESSION: 1. Endotracheal tube in stable position. 2. Prior CABG. Heart size stable. No acute cardiopulmonary disease identified . Chest is stable from prior exam. Electronically Signed   By: Marcello Moores  Register   On: 09/28/2016 07:53   Dg Chest Port 1 View  Result Date: 09/27/2016 CLINICAL DATA:  Endotracheal tube placement.  Initial encounter. EXAM: PORTABLE CHEST 1 VIEW COMPARISON:  Chest radiograph performed 09/02/2013 FINDINGS: The patient's endotracheal tube is seen ending 5 cm above the carina. The lungs are well-aerated and clear. There is no evidence of focal opacification, pleural effusion or pneumothorax. The cardiomediastinal silhouette is normal in size. The patient is status post median sternotomy. No acute osseous abnormalities are seen. IMPRESSION: 1. Endotracheal tube seen ending 5 cm above the carina. 2. No acute cardiopulmonary process seen. Electronically Signed   By: Garald Balding M.D.   On: 09/27/2016 18:32   Ir Percutaneous Art Thrombectomy/infusion Intracranial Inc Diag Angio  Result Date: 09/27/2016 INDICATION: 61 year old male presents with emergent large vessel occlusion of the right MCA. Given his high NIH stroke scale, baseline function, and within the window for treatment, he would like to proceed with cerebral angiogram and mechanical thrombectomy. EXAM: IR PERCUTANEOUS ART THORMBECTOMY/INFUSION INTRACRANIAL INCLUDE DIAG ANGIO; IR ANGIO INTRA EXTRACRAN SEL COM CAROTID INNOMINATE UNI LEFT MOD SED COMPARISON:  Noncontrast head CT 09/27/2016, cerebral CT angiogram 09/27/2016 MEDICATIONS: 2.0 g Ancef. The antibiotic was administered within 1 hour  of the procedure ANESTHESIA/SEDATION: General endotracheal anesthesia CONTRAST:  190 cc Isovue FLUOROSCOPY TIME:  Fluoroscopy Time: 86 minutes 0 seconds (4316 mGy). COMPLICATIONS: SIR Level A - No therapy, no consequence. TECHNIQUE: Two physicians were involved with this case. Case was initiated with Dr. Estanislado Pandy, and was completed by Dr. Earleen Newport. Both physicians were present for the informed written consent, which was obtained from the patient after a thorough discussion of the procedural risks, benefits and alternatives. Specific risks discussed include: Bleeding, infection, contrast reaction, kidney injury/failure, need for further procedure/surgery, arterial injury or dissection, embolization to new territory, intracranial hemorrhage (10-15% risk), neurologic deterioration, cardiopulmonary collapse, death. All questions were addressed. Maximal Sterile Barrier Technique was utilized including during the procedure including caps, mask, sterile gowns, sterile gloves, sterile drape, hand hygiene and skin antiseptic. A timeout was performed prior to the initiation of the procedure. Dr. Estanislado Pandy initiated the procedure. Eleven blade was used to make a small incision at the skin site in the right inguinal region. A micropuncture needle was used access the right common femoral artery. With excellent arterial blood flow returned, an .018 micro wire was passed through the needle, observed to enter the abdominal aorta under fluoroscopy. The needle was removed, and a micropuncture sheath was placed over the wire. The inner dilator and wire were removed, and an 035 wire was advanced under fluoroscopy into the abdominal aorta. The sheath was removed and a standard 5 [place] vascular sheath was placed. The dilator was removed and the sheath was flushed. A 5F JB-1 diagnostic catheter was advanced over the wire to the proximal descending thoracic aorta. Wire was  then removed. Double flush of the catheter was performed. Catheter  was then used to select the innominate artery. Angiogram was performed. Using roadmap technique, the catheter was advanced over a roadrunner wire into right common carotid artery. Formal angiogram was performed. Exchange length Rosen wire was then passed through the diagnostic catheter to the distal common carotid artery and the diagnostic catheter was removed. The 5 French sheath was removed and exchanged for 8 French 55 centimeter BrightTip sheath. Sheath was flushed and attached to pressurized and heparinized saline bag for constant forward flow. Then an 8 [place], 85 cm Flowgate balloon tip catheter was prepared on the back table with inflation of the balloon with 50/50 concentration of dilute contrast. The balloon catheter was then advanced over the wire, positioned into the distal common carotid artery. Copious back flush was performed and the balloon catheter was attached to heparinized and pressurized saline bag for forward flow. Flowgate balloon catheter was then advanced over the road runner wire into the distal internal carotid artery. Angiogram was performed for road map guide. Microcatheter system was then introduced through an intermediate catheter, Ace 64, and the balloon guide catheter, using a synchro soft 014 wire and a Trevo Provue18 catheter. Microcatheter and intermediate catheter system was advanced into the internal carotid artery, to the level of the occlusion. The micro wire was then carefully advanced through the occluded segment. Microcatheter was then push through the occluded segment and the wire was removed. Blood was then aspirated through the hub of the microcatheter, and a gentle contrast injection was performed confirming intraluminal position. A rotating hemostatic valve was then attached to the back end of the microcatheter, and a pressurized and heparinized saline bag was attached to the catheter. 4 x 40 solitaire device was then selected. Back flush was achieved at the rotating  hemostatic valve, and then the device was gently advanced through the microcatheter to the distal end. The retriever was then unsheathed by withdrawing the microcatheter under fluoroscopy. Once the retriever was completely unsheathed, control angiogram was performed from the balloon catheter. The intermediate Ace 64 catheter was then advanced into the middle cerebral artery for local aspiration. Aspiration was then performed at the tip of the Ace catheter as the retriever was gently and slowly withdrawn with fluoroscopic observation. Once the retriever was entirely removed from the system, free aspiration was confirmed at the hub of the intermediate catheter, with free blood return confirmed. Control angiogram was performed. Given incomplete reperfusion, another attempt was initiated. Has the microcatheter system was advanced under road map fluoroscopic guidance into the right middle cerebral artery, it was recognized that there was prolapse of the system into the aortic arch. The entire balloon catheter, intermediate catheter, microcatheter system had prolapsed into the aortic arch and require removal. Dr Earleen Newport was then present for the remainder of the case. Once the system was double flushed, a JB 1 catheter was advanced into the aortic arch and used to select the common carotid artery. Control angiogram was performed. The catheter was then used to select the common carotid artery with control run performed. The JB 1 catheter and roadrunner wire were then advanced into the proximal internal carotid artery, and after confirming location with a control angiogram, the wire was removed. Rosen wire was then placed into the internal carotid artery and the JB catheter was removed. Again, 8 coaxial system of microcatheter, intermediate Ace 64 catheter, and the balloon guide were advanced over the Rosen wire into the internal carotid artery. Wire  was removed and once the system was aspirated and flushed with pressurized  heparin bags attached, a control angiogram was performed. The microcatheter and micro wire were then advanced across the occlusion for a second thrombectomy attempt. The second attempt was also local aspiration through the penumbra catheter and use of 4 x 40 solitaire stentreiver. Control angiogram demonstrated proximal embolization of the distal MCA with decreased TICI perfusion. Third attempt was performed using similar technique, local aspiration and solitaire 4 x 40 stentriever. In total 5 passes were made with local aspiration via Ace 64 catheter and 4 x 40 stentriever. The final and sixth pass was performed to re-establish flow through a dominant insular branch, using a 4 x 30 Trevo stentriever and a 64 aspiration. Trevo aspiration with proximal flow control (TRAP) technique was used on the final pass, with reestablished near complete, TICI 2b flow. Catheter and wires were removed from the right-sided cerebral circulation. JB 1 catheter was readvanced to the aortic arch, and angiogram of the left carotid system was performed including cerebral images. JB 1 catheter was removed. Control angiogram was performed at the right common femoral artery puncture site. The 8 French sheath was exchanged for a standard 9 French sheath at the common femoral artery puncture site. Patient tolerated the procedure well and remained hemodynamically stable throughout. No complications were encountered and no significant blood loss encountered. FINDINGS: Initial cerebral angiogram Right common carotid artery: Normal course caliber and contour. Mild atherosclerotic changes at the right carotid bulb with no significant stenosis. Minimal tortuosity of the right ICA. Right external carotid artery: Patent with antegrade flow. Right internal carotid artery: Minimal tortuosity of the right internal carotid. Right MCA: Occlusion of the proximal right MCA, with no significant plaque present at the cavernous segment, ophthalmic segment, to  the terminus. Ophthalmic artery patent. Posterior communicating artery patent. There is a temporal branch patent just proximal to the MCA occlusion. At the initiation of the case there is no significant collateral flow to the right MCA territory secondary to [facility] branches. Right ACA: A 1 segment patent. A 2 segment perfuses the right sided territory. No significant collateral filling of the right MCA territory upon initiation of the case. After the first pass, there is TICI 2b established. Persistent occlusion of a proximal insular branch with absence of MCA cortical branches. After the second pass, there was more proximal embolization of the clot, with decreased perfusion to the right MCA territory. TICI 1 Third - fifth passes were useful in reestablishing TICI 2a flow. The final and sixth pass targeting large proximal insular branch occlusion was successful in reestablishing TICI 2b flow. Left common carotid artery:  Normal course caliber and contour. Left external carotid artery: Patent with antegrade flow. Left internal carotid artery: Moderate atherosclerotic changes at the left carotid bulb with no significant narrowing on the lateral view and the anterior view demonstrating approximately 58%. No significant tortuosity of the left carotid system. Left MCA: M1 segment patent. Insular and opercular segments patent. Unremarkable caliber and course of the cortical segments. Typical arterial, capillary/ parenchymal, and venous phase. Left ACA: A 1 segment patent. A 2 segment perfuses the left territory. IMPRESSION: Status post cerebral angiogram with mechanical thrombectomy of proximal right MCA occlusion and reestablished near-complete, TICI 2b flow to the right hemisphere. Left cerebral angiogram demonstrating adequate perfusion. Left cervical angiogram demonstrates moderate carotid bulb disease, measuring approximately 58% stenosis. Signed, Dulcy Fanny. Earleen Newport, DO [facility]  [facility] Electronically Signed   By:  Corrie Mckusick D.O.   On: 09/27/2016 17:56   Ct Head Code Stroke W/o Cm  Result Date: 09/27/2016 CLINICAL DATA:  Code stroke. 61 year old male with left side weakness and slurred speech. Initial encounter. EXAM: CT HEAD WITHOUT CONTRAST TECHNIQUE: Contiguous axial images were obtained from the base of the skull through the vertex without intravenous contrast. COMPARISON:  Cervical spine MRI 03/30/2014 FINDINGS: Brain: No acute intracranial hemorrhage identified. No midline shift, mass effect, or evidence of intracranial mass lesion. 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 There is some associated right external capsule hypodensity. No superimposed acute cortically based infarct changes are identified. No other cortical encephalomalacia. Gray-white matter differentiation outside of the area above appears within normal limits. No ventriculomegaly. Vascular: Hyperdense right MCA (series 4, image 27). Skull: No acute osseous abnormality identified. Sinuses/Orbits: Mild mastoid effusion. Adenoid hypertrophy. Paranasal sinuses are clear except for right maxillary mucosal thickening. Other: Mild rightward gaze deviation. Negative orbit and scalp soft tissues otherwise. ASPECTS [facility] Stroke Program Early CT Score) - Ganglionic level infarction (caudate, lentiform nuclei, internal capsule, insula, M1-M3 cortex): 7 - Supraganglionic infarction (M4-M6 cortex): 3 Total score (0-10 with 10 being normal): 10 IMPRESSION: 1. Positive for hyperdense right MCA suspicious for emergent large vessel occlusion. No acute intracranial hemorrhage or changes of acute cortically based infarct are identified. 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 2. ASPECTS is 10. 3. Study discussed by telephone with Dr. Lawana Pai on 09/27/2016  at 1225 hours. CTA is pending. Electronically Signed   By: Genevie Ann M.D.   On: 09/27/2016 12:28   Ir Angio Intra Extracran Sel Com Carotid Innominate Uni L Mod Sed  Result Date: 09/27/2016 INDICATION: 61 year old male presents with emergent large vessel occlusion of the right MCA. Given his high NIH stroke scale, baseline function, and within the window for treatment, he would like to proceed with cerebral angiogram and mechanical thrombectomy. EXAM: IR PERCUTANEOUS ART THORMBECTOMY/INFUSION INTRACRANIAL INCLUDE DIAG ANGIO; IR ANGIO INTRA EXTRACRAN SEL COM CAROTID INNOMINATE UNI LEFT MOD SED COMPARISON:  Noncontrast head CT 09/27/2016, cerebral CT angiogram 09/27/2016 MEDICATIONS: 2.0 g Ancef. The antibiotic was administered within 1 hour of the procedure ANESTHESIA/SEDATION: General endotracheal anesthesia CONTRAST:  190 cc Isovue FLUOROSCOPY TIME:  Fluoroscopy Time: 86 minutes 0 seconds (4316 mGy). COMPLICATIONS: SIR Level A - No therapy, no consequence. TECHNIQUE: Two physicians were involved with this case. Case was initiated with Dr. Estanislado Pandy, and was completed by Dr. Earleen Newport. Both physicians were present for the informed written consent, which was obtained from the patient after a thorough discussion of the procedural risks, benefits and alternatives. Specific risks discussed include: Bleeding, infection, contrast reaction, kidney injury/failure, need for further procedure/surgery, arterial injury or dissection, embolization to new territory, intracranial hemorrhage (10-15% risk), neurologic deterioration, cardiopulmonary collapse, death. All questions were addressed. Maximal Sterile Barrier Technique was utilized including during the procedure including caps, mask, sterile gowns, sterile gloves, sterile drape, hand hygiene and skin antiseptic. A timeout was performed prior to the initiation of the procedure. Dr. Estanislado Pandy initiated the procedure. Eleven blade was used to make a small incision at the skin  site in the right inguinal region. A micropuncture needle was used access the right common femoral artery. With excellent arterial blood flow returned, an .018 micro wire was passed through the needle, observed to enter the abdominal aorta under fluoroscopy. The needle was removed,  and a micropuncture sheath was placed over the wire. The inner dilator and wire were removed, and an 035 wire was advanced under fluoroscopy into the abdominal aorta. The sheath was removed and a standard 5 [place] vascular sheath was placed. The dilator was removed and the sheath was flushed. A 34F JB-1 diagnostic catheter was advanced over the wire to the proximal descending thoracic aorta. Wire was then removed. Double flush of the catheter was performed. Catheter was then used to select the innominate artery. Angiogram was performed. Using roadmap technique, the catheter was advanced over a roadrunner wire into right common carotid artery. Formal angiogram was performed. Exchange length Rosen wire was then passed through the diagnostic catheter to the distal common carotid artery and the diagnostic catheter was removed. The 5 French sheath was removed and exchanged for 8 French 55 centimeter BrightTip sheath. Sheath was flushed and attached to pressurized and heparinized saline bag for constant forward flow. Then an 8 [place], 85 cm Flowgate balloon tip catheter was prepared on the back table with inflation of the balloon with 50/50 concentration of dilute contrast. The balloon catheter was then advanced over the wire, positioned into the distal common carotid artery. Copious back flush was performed and the balloon catheter was attached to heparinized and pressurized saline bag for forward flow. Flowgate balloon catheter was then advanced over the road runner wire into the distal internal carotid artery. Angiogram was performed for road map guide. Microcatheter system was then introduced through an intermediate catheter, Ace 64, and the  balloon guide catheter, using a synchro soft 014 wire and a Trevo Provue18 catheter. Microcatheter and intermediate catheter system was advanced into the internal carotid artery, to the level of the occlusion. The micro wire was then carefully advanced through the occluded segment. Microcatheter was then push through the occluded segment and the wire was removed. Blood was then aspirated through the hub of the microcatheter, and a gentle contrast injection was performed confirming intraluminal position. A rotating hemostatic valve was then attached to the back end of the microcatheter, and a pressurized and heparinized saline bag was attached to the catheter. 4 x 40 solitaire device was then selected. Back flush was achieved at the rotating hemostatic valve, and then the device was gently advanced through the microcatheter to the distal end. The retriever was then unsheathed by withdrawing the microcatheter under fluoroscopy. Once the retriever was completely unsheathed, control angiogram was performed from the balloon catheter. The intermediate Ace 64 catheter was then advanced into the middle cerebral artery for local aspiration. Aspiration was then performed at the tip of the Ace catheter as the retriever was gently and slowly withdrawn with fluoroscopic observation. Once the retriever was entirely removed from the system, free aspiration was confirmed at the hub of the intermediate catheter, with free blood return confirmed. Control angiogram was performed. Given incomplete reperfusion, another attempt was initiated. Has the microcatheter system was advanced under road map fluoroscopic guidance into the right middle cerebral artery, it was recognized that there was prolapse of the system into the aortic arch. The entire balloon catheter, intermediate catheter, microcatheter system had prolapsed into the aortic arch and require removal. Dr Earleen Newport was then present for the remainder of the case. Once the system was  double flushed, a JB 1 catheter was advanced into the aortic arch and used to select the common carotid artery. Control angiogram was performed. The catheter was then used to select the common carotid artery with control run performed. The JB  1 catheter and roadrunner wire were then advanced into the proximal internal carotid artery, and after confirming location with a control angiogram, the wire was removed. Rosen wire was then placed into the internal carotid artery and the JB catheter was removed. Again, 8 coaxial system of microcatheter, intermediate Ace 64 catheter, and the balloon guide were advanced over the Rosen wire into the internal carotid artery. Wire was removed and once the system was aspirated and flushed with pressurized heparin bags attached, a control angiogram was performed. The microcatheter and micro wire were then advanced across the occlusion for a second thrombectomy attempt. The second attempt was also local aspiration through the penumbra catheter and use of 4 x 40 solitaire stentreiver. Control angiogram demonstrated proximal embolization of the distal MCA with decreased TICI perfusion. Third attempt was performed using similar technique, local aspiration and solitaire 4 x 40 stentriever. In total 5 passes were made with local aspiration via Ace 64 catheter and 4 x 40 stentriever. The final and sixth pass was performed to re-establish flow through a dominant insular branch, using a 4 x 30 Trevo stentriever and a 64 aspiration. Trevo aspiration with proximal flow control (TRAP) technique was used on the final pass, with reestablished near complete, TICI 2b flow. Catheter and wires were removed from the right-sided cerebral circulation. JB 1 catheter was readvanced to the aortic arch, and angiogram of the left carotid system was performed including cerebral images. JB 1 catheter was removed. Control angiogram was performed at the right common femoral artery puncture site. The 8 French  sheath was exchanged for a standard 9 French sheath at the common femoral artery puncture site. Patient tolerated the procedure well and remained hemodynamically stable throughout. No complications were encountered and no significant blood loss encountered. FINDINGS: Initial cerebral angiogram Right common carotid artery: Normal course caliber and contour. Mild atherosclerotic changes at the right carotid bulb with no significant stenosis. Minimal tortuosity of the right ICA. Right external carotid artery: Patent with antegrade flow. Right internal carotid artery: Minimal tortuosity of the right internal carotid. Right MCA: Occlusion of the proximal right MCA, with no significant plaque present at the cavernous segment, ophthalmic segment, to the terminus. Ophthalmic artery patent. Posterior communicating artery patent. There is a temporal branch patent just proximal to the MCA occlusion. At the initiation of the case there is no significant collateral flow to the right MCA territory secondary to [employer] [employer] [facility] branches. Right ACA: A 1 segment patent. A 2 segment perfuses the right sided territory. No significant collateral filling of the right MCA territory upon initiation of the case. After the first pass, there is TICI 2b established. Persistent occlusion of a proximal insular branch with absence of MCA cortical branches. After the second pass, there was more proximal embolization of the clot, with decreased perfusion to the right MCA territory. TICI 1 Third - fifth passes were useful in reestablishing TICI 2a flow. The final and sixth pass targeting large proximal insular branch occlusion was successful in reestablishing TICI 2b flow. Left common carotid artery:  Normal course caliber and contour. Left external carotid artery: Patent with antegrade flow. Left internal carotid artery: Moderate atherosclerotic changes at the left carotid bulb with no significant narrowing on the lateral view and the anterior view  demonstrating approximately 58%. No significant tortuosity of the left carotid system. Left MCA: M1 segment patent. Insular and opercular segments patent. Unremarkable caliber and course of the cortical segments. Typical arterial, capillary/ parenchymal, and venous phase. Left ACA: A 1  segment patent. A 2 segment perfuses the left territory. IMPRESSION: Status post cerebral angiogram with mechanical thrombectomy of proximal right MCA occlusion and reestablished near-complete, TICI 2b flow to the right hemisphere. Left cerebral angiogram demonstrating adequate perfusion. Left cervical angiogram demonstrates moderate carotid bulb disease, measuring approximately 58% stenosis. Signed, Dulcy Fanny. Earleen Newport, DO [facility] Electronically Signed   By: Corrie Mckusick D.O.   On: 09/27/2016 17:56   Assessment/Plan : Acute hypoxemic respiratory failure 2/2 unable to protect airway with acute CVA  COPD likely (not diagnosed), not in exacerbation - doing well on PST - extubate this am - duoneb QID  Acute RMCA CVA, S/P tPA, IR revascularization, 09/27/16 - per Neurology - needs swallow evaluation post extubation - cont IVF for now.     I spent  30 minutes of Critical Care time with this patient today. This is my time spent independent of the APP or resident.   Family :Family updated at length today.  Wife updated at bedside.    Monica Becton, MD 09/28/2016, 10:32 AM [facility] Pager [phone] After 3 pm or if no answer, call [phone]

## 2016-09-28 NOTE — Care Management Note (Signed)
Case Management Note  Patient Details  Name: [name] MRN: [id number] Date of Birth: 09-23-1955  Subjective/Objective:   Pt admitted on 09/27/16 s/p Rt MCA infarct with TPA and revascularization.  PTA, pt independent, lives with spouse.                  Action/Plan: PT/OT evaluations pending.  Will follow for discharge planning as pt progresses.   Expected Discharge Date:                  Expected Discharge Plan:  [place]  In-House Referral:     Discharge planning Services  CM Consult  Post Acute Care Choice:    Choice offered to:     DME Arranged:    DME Agency:     HH Arranged:    [place] Agency:     Status of Service:  In process, will continue to follow  If discussed at Long Length of Stay Meetings, dates discussed:    Additional Comments:  Reinaldo Raddle, RN, BSN  Trauma/Neuro ICU Case Manager [phone]

## 2016-09-28 NOTE — Accreditation Note (Signed)
Report given to [facility] RN who will take over care of pt at this time.

## 2016-09-28 NOTE — Progress Notes (Signed)
Referring Physician(s): Dr Lavera Guise  Supervising Physician: Corrie Mckusick  Patient Status:  [facility] - In-pt  Chief Complaint:  CVA Findings:  - initial image confirms R MCA occlusion, with only temporal branch patency - Initial pass results in TICI 2B flow, with persisting proximal occlusion of large insular branch - 2nd pass to open the insular and cortical flow resulted in mobilization of the clot proximal, with decreased TICI flow to TICI 1 flow.. - 4 extra passes were successful in re-establishing TICI 2b flow.   Left carotid injection demonstrates no compromise of the left cerebral hemisphere. Severe left CCA bifurcation disease.   Subjective:  R MCA revascularization 2/13 in IR Moving all 4s Nods yes/no appropriately Intubated now For extubation asap   Allergies: Codeine  Medications: Prior to Admission medications   Medication Sig Start Date End Date Taking? Authorizing Provider  ALPRAZolam Duanne Moron) 1 MG tablet TAKE ONE TABLET BY MOUTH THREE TIMES DAILY AS NEEDED FOR ANXIETY 09/14/16   Lucille Passy, MD  aspirin 81 MG tablet Take 81 mg by mouth daily.      Historical Provider, MD  Calcium Citrate-Vitamin D 500-400 MG-UNIT CHEW Chew 1 tablet by mouth 2 (two) times daily.      Historical Provider, MD  Cholecalciferol (VITAMIN D) 2000 UNITS tablet Take 5,000 Units by mouth daily.     Historical Provider, MD  cyanocobalamin 500 MCG tablet Take 2,500 mcg by mouth daily.    Historical Provider, MD  docusate sodium (COLACE) 100 MG capsule Take 100 mg by mouth as needed.      Historical Provider, MD  fexofenadine (ALLEGRA) 180 MG tablet Take 180 mg by mouth daily as needed. 12/27/11   Lucille Passy, MD  HYDROcodone-acetaminophen (NORCO) 10-325 MG tablet Take 1 tablet by mouth every 4 (four) hours as needed for severe pain. 09/14/16   Lucille Passy, MD  metoprolol succinate (TOPROL-XL) 25 MG 24 hr tablet TAKE 1 TABLET BY MOUTH TWICE DAILY 06/03/16   Lucille Passy, MD  Multiple Vitamin  (MULTIVITAMIN) tablet Take 1 tablet by mouth daily.      Historical Provider, MD  nitroGLYCERIN (NITROSTAT) 0.3 MG SL tablet Place 1 tablet (0.3 mg total) under the tongue every 5 (five) minutes as needed. 10/10/11   Lucille Passy, MD  omeprazole (PRILOSEC) 20 MG capsule Take 1 capsule (20 mg total) by mouth 2 (two) times daily before a meal. 01/08/15   Lucille Passy, MD  simvastatin (ZOCOR) 40 MG tablet TAKE 1 TABLET BY MOUTH AT BEDTIME FOR CHOLESTEROL 03/23/16   Lucille Passy, MD     Vital Signs: BP (!) 127/59   Pulse (!) 48   Temp 98.7 F (37.1 C) (Axillary)   Resp (!) 21   Ht 5\' 9"  (1.753 m) Comment: Simultaneous filing. User may not have seen previous data.  Wt 178 lb 2.1 oz (80.8 kg)   SpO2 97%   BMI 26.31 kg/m   Physical Exam  Constitutional: He appears well-developed.  Intubated; but follows all commands and answering questions with nods appropriately  HENT:  Head: Atraumatic.  Abdominal: Soft.  Musculoskeletal: Normal range of motion.  Good strength and sensation B Good grip B=   Neurological: He is alert.  Follows commands Appropriate answers   Skin: Skin is warm.  Rt groin NT no bleeding No hematoma Sheath intact Rt foot 2+ pulses   Nursing note and vitals reviewed.   Imaging: Ct Angio Head W Or Wo Contrast  Result Date: 09/27/2016 CLINICAL DATA:  61 year old male code stroke with suspicion of right MCA occlusion on noncontrast head CT. Initial encounter. EXAM: CT ANGIOGRAPHY HEAD AND NECK TECHNIQUE: Multidetector CT imaging of the head and neck was performed using the standard protocol during bolus administration of intravenous contrast. Multiplanar CT image reconstructions and MIPs were obtained to evaluate the vascular anatomy. Carotid stenosis measurements (when applicable) are obtained utilizing NASCET criteria, using the distal internal carotid diameter as the denominator. CONTRAST:  50 mL Isovue 370 COMPARISON:  Noncontrast head CT 1214 hours today. FINDINGS:  CTA NECK Skeleton: Absent maxillary dentition. Lower cervical ACDF hardware. Previous median sternotomy. No acute osseous abnormality identified. Upper chest: Centrilobular and paraseptal emphysema. Previous CABG. No superior mediastinal lymphadenopathy. Other neck: Negative thyroid, larynx, pharynx, parapharyngeal spaces, retropharyngeal space, sublingual space, submandibular glands and parotid glands. No cervical lymphadenopathy. Aortic arch: 3 vessel arch configuration. Mild to moderate mostly distal arch calcified atherosclerosis. Right carotid system: Negative brachiocephalic artery and right CCA origin. No right CCA stenosis. Calcified plaque at the right carotid bifurcation including in the right ICA origin and bulb, but no significant stenosis. Mildly tortuous cervical right ICA. Left carotid system: Negative left CCA origin. Calcified plaque along the anterior left CCA without stenosis proximal to the bifurcation. Bulky calcified plaque at the left carotid bifurcation resulting in high-grade left ICA origin stenosis (numerically estimated at 80 % with respect to the distal vessel) and approaching a RADIOGRAPHIC STRING SIGN. See series 5, image 120 and series 8, image 114. Calcified plaque continues in the left ICA bulb without additional stenosis. Vertebral arteries: No proximal right subclavian artery or right vertebral artery origins stenosis. Negative cervical right vertebral artery. Calcified plaque in the proximal left subclavian artery without stenosis. Bulky calcified plaque at the left vertebral artery origin resulting in mild to moderate origin stenosis (series 8, image 144). The left vertebral artery is mildly dominant, with no additional stenosis to the skullbase. CTA HEAD Posterior circulation: Dominant distal left vertebral artery primarily supplies the basilar. The non dominant distal right vertebral artery is diminutive beyond the right PICA origin. There is mild left V4 segment calcified  plaque without distal left vertebral artery stenosis. Mild irregularity at the vertebrobasilar junction does not appear hemodynamically significant. Mildly tortuous basilar artery without stenosis. SCA and PCA origins are patent. Posterior communicating arteries are diminutive or absent. Bilateral PCA branches appear within normal limits. Anterior circulation: Both ICA siphons are patent. There is fairly extensive bilateral siphon calcified plaque, but this does not appear to be hemodynamically significant. Both carotid termini are patent. MCA and ACA origins are normal. Diminutive or absent anterior communicating artery. Bilateral ACA branches, left MCA M1 segment, and left MCA branches appear within normal limits. The right MCA M1 segment is occluded 9-10 mm beyond its origin. See series 11, image 22 and series 9, image 18. While there is no enhancement at the right MCA bifurcationthere is some reconstitution of the right MCA branches as seen on series 9, image 16. Venous sinuses: Appear patent. Anatomic variants: Dominant left vertebral artery. Review of the MIP images confirms the above findings IMPRESSION: 1. 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 This was relayed via text pager to [facility] on 09/27/2016 at 1230 hours, and later we discussed by telephone. 2. Superimposed right carotid calcified atherosclerosis without hemodynamically significant stenosis. 3. However, there is High-grade (80%, approaching RADIOGRAPHIC STRING SIGN) stenosis at the left ICA origin. 4. Dominant left vertebral artery  with up to moderate origin stenosis due to calcified plaque. Non dominant right vertebral artery functionally terminates in PICA. Electronically Signed   By: Genevie Ann M.D.   On: 09/27/2016 12:46   Ct Head Wo Contrast  Result Date: 09/28/2016 CLINICAL DATA:  Followup stroke. EXAM: CT HEAD WITHOUT CONTRAST TECHNIQUE: Contiguous axial images were obtained from the base of the skull  through the vertex without intravenous contrast. COMPARISON:  CT HEAD September 27, 2016 FINDINGS: BRAIN: Residual contrast staining RIGHT insula with density in the RIGHT sylvian fissure. Small infarct RIGHT posterior putaminal RIGHT inferior frontal lobe probable encephalomalacia. No definite intraparenchymal hemorrhage. No midline shift or mass effect. Ventricles are normal for patient's age. Basal cisterns are patent. VASCULAR: Moderate to severe calcific atherosclerosis of the carotid siphons. SKULL: No skull fracture. No significant scalp soft tissue swelling. SINUSES/ORBITS: RIGHT paranasal sinus mucosal thickening with atresia compatible with chronic sinusitis. Trace LEFT mastoid effusion. The included ocular globes and orbital contents are non-suspicious. OTHER: Poor dentition. IMPRESSION: Acute RIGHT basal ganglia nonhemorrhagic infarct. Residual RIGHT contrast staining. RIGHT inferior frontal lobe encephalomalacia. Moderate to severe atherosclerosis. Electronically Signed   By: Elon Alas M.D.   On: 09/28/2016 05:32   Ct Head Wo Contrast  Result Date: 09/27/2016 CLINICAL DATA:  61 year old male with ELVO, right MCA occlusion status post endovascular reperfusion. Initial encounter. EXAM: CT HEAD WITHOUT CONTRAST TECHNIQUE: Contiguous axial images were obtained from the base of the skull through the vertex without intravenous contrast. COMPARISON:  Endovascular images earlier today. CTA head and neck 1227 hours today, and earlier. FINDINGS: Brain: Parenchymal staining in the posterior right temporal lobe, and some of the more posterior right MCA territory including the inferior right parietal lobe. No associated mass effect. No acute intracranial hemorrhage identified. No superimposed right MCA cytotoxic edema identified; chronic right inferior frontal gyrus encephalomalacia. No ventriculomegaly or intracranial mass effect. Vascular: Calcified atherosclerosis at the skull base. Residual  intravascular contrast. Skull: No acute osseous abnormality identified. Sinuses/Orbits: Intubated. Stable mild right maxillary sinus mucosal thickening. Other: No acute orbit or scalp soft tissue findings. IMPRESSION: 1. Post endovascular Neuro-intervention parenchymal staining in the right temporal and parietal lobes. No superimposed cytotoxic edema, acute intracranial hemorrhage or mass effect identified. 2. Small area of chronic encephalomalacia in the right anterior insula / inferior frontal gyrus. Electronically Signed   By: Genevie Ann M.D.   On: 09/27/2016 16:39   Ct Angio Neck W Or Wo Contrast  Result Date: 09/27/2016 CLINICAL DATA:  61 year old male code stroke with suspicion of right MCA occlusion on noncontrast head CT. Initial encounter. EXAM: CT ANGIOGRAPHY HEAD AND NECK TECHNIQUE: Multidetector CT imaging of the head and neck was performed using the standard protocol during bolus administration of intravenous contrast. Multiplanar CT image reconstructions and MIPs were obtained to evaluate the vascular anatomy. Carotid stenosis measurements (when applicable) are obtained utilizing NASCET criteria, using the distal internal carotid diameter as the denominator. CONTRAST:  50 mL Isovue 370 COMPARISON:  Noncontrast head CT 1214 hours today. FINDINGS: CTA NECK Skeleton: Absent maxillary dentition. Lower cervical ACDF hardware. Previous median sternotomy. No acute osseous abnormality identified. Upper chest: Centrilobular and paraseptal emphysema. Previous CABG. No superior mediastinal lymphadenopathy. Other neck: Negative thyroid, larynx, pharynx, parapharyngeal spaces, retropharyngeal space, sublingual space, submandibular glands and parotid glands. No cervical lymphadenopathy. Aortic arch: 3 vessel arch configuration. Mild to moderate mostly distal arch calcified atherosclerosis. Right carotid system: Negative brachiocephalic artery and right CCA origin. No right CCA stenosis. Calcified plaque at the  right carotid bifurcation including in the right ICA origin and bulb, but no significant stenosis. Mildly tortuous cervical right ICA. Left carotid system: Negative left CCA origin. Calcified plaque along the anterior left CCA without stenosis proximal to the bifurcation. Bulky calcified plaque at the left carotid bifurcation resulting in high-grade left ICA origin stenosis (numerically estimated at 80 % with respect to the distal vessel) and approaching a RADIOGRAPHIC STRING SIGN. See series 5, image 120 and series 8, image 114. Calcified plaque continues in the left ICA bulb without additional stenosis. Vertebral arteries: No proximal right subclavian artery or right vertebral artery origins stenosis. Negative cervical right vertebral artery. Calcified plaque in the proximal left subclavian artery without stenosis. Bulky calcified plaque at the left vertebral artery origin resulting in mild to moderate origin stenosis (series 8, image 144). The left vertebral artery is mildly dominant, with no additional stenosis to the skullbase. CTA HEAD Posterior circulation: Dominant distal left vertebral artery primarily supplies the basilar. The non dominant distal right vertebral artery is diminutive beyond the right PICA origin. There is mild left V4 segment calcified plaque without distal left vertebral artery stenosis. Mild irregularity at the vertebrobasilar junction does not appear hemodynamically significant. Mildly tortuous basilar artery without stenosis. SCA and PCA origins are patent. Posterior communicating arteries are diminutive or absent. Bilateral PCA branches appear within normal limits. Anterior circulation: Both ICA siphons are patent. There is fairly extensive bilateral siphon calcified plaque, but this does not appear to be hemodynamically significant. Both carotid termini are patent. MCA and ACA origins are normal. Diminutive or absent anterior communicating artery. Bilateral ACA branches, left MCA M1  segment, and left MCA branches appear within normal limits. The right MCA M1 segment is occluded 9-10 mm beyond its origin. See series 11, image 22 and series 9, image 18. While there is no enhancement at the right MCA bifurcationthere is some reconstitution of the right MCA branches as seen on series 9, image 16. Venous sinuses: Appear patent. Anatomic variants: Dominant left vertebral artery. Review of the MIP images confirms the above findings IMPRESSION: 1. 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 This was relayed via text pager to [facility] on 09/27/2016 at 1230 hours, and later we discussed by telephone. 2. Superimposed right carotid calcified atherosclerosis without hemodynamically significant stenosis. 3. However, there is High-grade (80%, approaching RADIOGRAPHIC STRING SIGN) stenosis at the left ICA origin. 4. Dominant left vertebral artery with up to moderate origin stenosis due to calcified plaque. Non dominant right vertebral artery functionally terminates in PICA. Electronically Signed   By: Genevie Ann M.D.   On: 09/27/2016 12:46   Dg Chest Port 1 View  Result Date: 09/28/2016 CLINICAL DATA:  Respiratory failure. EXAM: PORTABLE CHEST 1 VIEW COMPARISON:  09/27/2016 . FINDINGS: Endotracheal tube in stable position. Prior CABG. Heart size stable. No focal infiltrate. No pleural effusion or pneumothorax. Prior cervical spine fusion. IMPRESSION: 1. Endotracheal tube in stable position. 2. Prior CABG. Heart size stable. No acute cardiopulmonary disease identified . Chest is stable from prior exam. Electronically Signed   By: Marcello Moores  Register   On: 09/28/2016 07:53   Dg Chest Port 1 View  Result Date: 09/27/2016 CLINICAL DATA:  Endotracheal tube placement.  Initial encounter. EXAM: PORTABLE CHEST 1 VIEW COMPARISON:  Chest radiograph performed 09/02/2013 FINDINGS: The patient's endotracheal tube is seen ending 5 cm above the carina. The lungs are well-aerated and clear.  There is no evidence of focal opacification, pleural effusion  or pneumothorax. The cardiomediastinal silhouette is normal in size. The patient is status post median sternotomy. No acute osseous abnormalities are seen. IMPRESSION: 1. Endotracheal tube seen ending 5 cm above the carina. 2. No acute cardiopulmonary process seen. Electronically Signed   By: Garald Balding M.D.   On: 09/27/2016 18:32   Ir Percutaneous Art Thrombectomy/infusion Intracranial Inc Diag Angio  Result Date: 09/27/2016 INDICATION: 61 year old male presents with emergent large vessel occlusion of the right MCA. Given his high NIH stroke scale, baseline function, and within the window for treatment, he would like to proceed with cerebral angiogram and mechanical thrombectomy. EXAM: IR PERCUTANEOUS ART THORMBECTOMY/INFUSION INTRACRANIAL INCLUDE DIAG ANGIO; IR ANGIO INTRA EXTRACRAN SEL COM CAROTID INNOMINATE UNI LEFT MOD SED COMPARISON:  Noncontrast head CT 09/27/2016, cerebral CT angiogram 09/27/2016 MEDICATIONS: 2.0 g Ancef. The antibiotic was administered within 1 hour of the procedure ANESTHESIA/SEDATION: General endotracheal anesthesia CONTRAST:  190 cc Isovue FLUOROSCOPY TIME:  Fluoroscopy Time: 86 minutes 0 seconds (4316 mGy). COMPLICATIONS: SIR Level A - No therapy, no consequence. TECHNIQUE: Two physicians were involved with this case. Case was initiated with Dr. Estanislado Pandy, and was completed by Dr. Earleen Newport. Both physicians were present for the informed written consent, which was obtained from the patient after a thorough discussion of the procedural risks, benefits and alternatives. Specific risks discussed include: Bleeding, infection, contrast reaction, kidney injury/failure, need for further procedure/surgery, arterial injury or dissection, embolization to new territory, intracranial hemorrhage (10-15% risk), neurologic deterioration, cardiopulmonary collapse, death. All questions were addressed. Maximal Sterile Barrier Technique was  utilized including during the procedure including caps, mask, sterile gowns, sterile gloves, sterile drape, hand hygiene and skin antiseptic. A timeout was performed prior to the initiation of the procedure. Dr. Estanislado Pandy initiated the procedure. Eleven blade was used to make a small incision at the skin site in the right inguinal region. A micropuncture needle was used access the right common femoral artery. With excellent arterial blood flow returned, an .018 micro wire was passed through the needle, observed to enter the abdominal aorta under fluoroscopy. The needle was removed, and a micropuncture sheath was placed over the wire. The inner dilator and wire were removed, and an 035 wire was advanced under fluoroscopy into the abdominal aorta. The sheath was removed and a standard 5 [place] vascular sheath was placed. The dilator was removed and the sheath was flushed. A 42F JB-1 diagnostic catheter was advanced over the wire to the proximal descending thoracic aorta. Wire was then removed. Double flush of the catheter was performed. Catheter was then used to select the innominate artery. Angiogram was performed. Using roadmap technique, the catheter was advanced over a roadrunner wire into right common carotid artery. Formal angiogram was performed. Exchange length Rosen wire was then passed through the diagnostic catheter to the distal common carotid artery and the diagnostic catheter was removed. The 5 French sheath was removed and exchanged for 8 French 55 centimeter BrightTip sheath. Sheath was flushed and attached to pressurized and heparinized saline bag for constant forward flow. Then an 8 [place], 85 cm Flowgate balloon tip catheter was prepared on the back table with inflation of the balloon with 50/50 concentration of dilute contrast. The balloon catheter was then advanced over the wire, positioned into the distal common carotid artery. Copious back flush was performed and the balloon catheter was attached  to heparinized and pressurized saline bag for forward flow. Flowgate balloon catheter was then advanced over the road runner wire into the distal internal carotid artery.  Angiogram was performed for road map guide. Microcatheter system was then introduced through an intermediate catheter, Ace 64, and the balloon guide catheter, using a synchro soft 014 wire and a Trevo Provue18 catheter. Microcatheter and intermediate catheter system was advanced into the internal carotid artery, to the level of the occlusion. The micro wire was then carefully advanced through the occluded segment. Microcatheter was then push through the occluded segment and the wire was removed. Blood was then aspirated through the hub of the microcatheter, and a gentle contrast injection was performed confirming intraluminal position. A rotating hemostatic valve was then attached to the back end of the microcatheter, and a pressurized and heparinized saline bag was attached to the catheter. 4 x 40 solitaire device was then selected. Back flush was achieved at the rotating hemostatic valve, and then the device was gently advanced through the microcatheter to the distal end. The retriever was then unsheathed by withdrawing the microcatheter under fluoroscopy. Once the retriever was completely unsheathed, control angiogram was performed from the balloon catheter. The intermediate Ace 64 catheter was then advanced into the middle cerebral artery for local aspiration. Aspiration was then performed at the tip of the Ace catheter as the retriever was gently and slowly withdrawn with fluoroscopic observation. Once the retriever was entirely removed from the system, free aspiration was confirmed at the hub of the intermediate catheter, with free blood return confirmed. Control angiogram was performed. Given incomplete reperfusion, another attempt was initiated. Has the microcatheter system was advanced under road map fluoroscopic guidance into the right  middle cerebral artery, it was recognized that there was prolapse of the system into the aortic arch. The entire balloon catheter, intermediate catheter, microcatheter system had prolapsed into the aortic arch and require removal. Dr Earleen Newport was then present for the remainder of the case. Once the system was double flushed, a JB 1 catheter was advanced into the aortic arch and used to select the common carotid artery. Control angiogram was performed. The catheter was then used to select the common carotid artery with control run performed. The JB 1 catheter and roadrunner wire were then advanced into the proximal internal carotid artery, and after confirming location with a control angiogram, the wire was removed. Rosen wire was then placed into the internal carotid artery and the JB catheter was removed. Again, 8 coaxial system of microcatheter, intermediate Ace 64 catheter, and the balloon guide were advanced over the Rosen wire into the internal carotid artery. Wire was removed and once the system was aspirated and flushed with pressurized heparin bags attached, a control angiogram was performed. The microcatheter and micro wire were then advanced across the occlusion for a second thrombectomy attempt. The second attempt was also local aspiration through the penumbra catheter and use of 4 x 40 solitaire stentreiver. Control angiogram demonstrated proximal embolization of the distal MCA with decreased TICI perfusion. Third attempt was performed using similar technique, local aspiration and solitaire 4 x 40 stentriever. In total 5 passes were made with local aspiration via Ace 64 catheter and 4 x 40 stentriever. The final and sixth pass was performed to re-establish flow through a dominant insular branch, using a 4 x 30 Trevo stentriever and a 64 aspiration. Trevo aspiration with proximal flow control (TRAP) technique was used on the final pass, with reestablished near complete, TICI 2b flow. Catheter and wires were  removed from the right-sided cerebral circulation. JB 1 catheter was readvanced to the aortic arch, and angiogram of the left carotid system  was performed including cerebral images. JB 1 catheter was removed. Control angiogram was performed at the right common femoral artery puncture site. The 8 French sheath was exchanged for a standard 9 French sheath at the common femoral artery puncture site. Patient tolerated the procedure well and remained hemodynamically stable throughout. No complications were encountered and no significant blood loss encountered. FINDINGS: Initial cerebral angiogram Right common carotid artery: Normal course caliber and contour. Mild atherosclerotic changes at the right carotid bulb with no significant stenosis. Minimal tortuosity of the right ICA. Right external carotid artery: Patent with antegrade flow. Right internal carotid artery: Minimal tortuosity of the right internal carotid. Right MCA: Occlusion of the proximal right MCA, with no significant plaque present at the cavernous segment, ophthalmic segment, to the terminus. Ophthalmic artery patent. Posterior communicating artery patent. There is a temporal branch patent just proximal to the MCA occlusion. At the initiation of the case there is no significant collateral flow to the right MCA territory secondary to [facility] branches. Right ACA: A 1 segment patent. A 2 segment perfuses the right sided territory. No significant collateral filling of the right MCA territory upon initiation of the case. After the first pass, there is TICI 2b established. Persistent occlusion of a proximal insular branch with absence of MCA cortical branches. After the second pass, there was more proximal embolization of the clot, with decreased perfusion to the right MCA territory. TICI 1 Third - fifth passes were useful in reestablishing TICI 2a flow. The final and sixth pass targeting large proximal insular branch occlusion was successful in reestablishing  TICI 2b flow. Left common carotid artery:  Normal course caliber and contour. Left external carotid artery: Patent with antegrade flow. Left internal carotid artery: Moderate atherosclerotic changes at the left carotid bulb with no significant narrowing on the lateral view and the anterior view demonstrating approximately 58%. No significant tortuosity of the left carotid system. Left MCA: M1 segment patent. Insular and opercular segments patent. Unremarkable caliber and course of the cortical segments. Typical arterial, capillary/ parenchymal, and venous phase. Left ACA: A 1 segment patent. A 2 segment perfuses the left territory. IMPRESSION: Status post cerebral angiogram with mechanical thrombectomy of proximal right MCA occlusion and reestablished near-complete, TICI 2b flow to the right hemisphere. Left cerebral angiogram demonstrating adequate perfusion. Left cervical angiogram demonstrates moderate carotid bulb disease, measuring approximately 58% stenosis. Signed, Dulcy Fanny. Earleen Newport, DO [facility] Rad[facility] Electronically Signed   By: Corrie Mckusick D.O.   On: 09/27/2016 17:56   Ct Head Code Stroke W/o Cm  Result Date: 09/27/2016 CLINICAL DATA:  Code stroke. 61 year old male with left side weakness and slurred speech. Initial encounter. EXAM: CT HEAD WITHOUT CONTRAST TECHNIQUE: Contiguous axial images were obtained from the base of the skull through the vertex without intravenous contrast. COMPARISON:  Cervical spine MRI 03/30/2014 FINDINGS: Brain: No acute intracranial hemorrhage identified. No midline shift, mass effect, or evidence of intracranial mass lesion. 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 There is some associated right external capsule hypodensity. No superimposed acute cortically based infarct changes are identified. No other cortical  encephalomalacia. Gray-white matter differentiation outside of the area above appears within normal limits. No ventriculomegaly. Vascular: Hyperdense right MCA (series 4, image 27). Skull: No acute osseous abnormality identified. Sinuses/Orbits: Mild mastoid effusion. Adenoid hypertrophy. Paranasal sinuses are clear except for right maxillary mucosal thickening. Other: Mild rightward gaze deviation. Negative  orbit and scalp soft tissues otherwise. ASPECTS [facility] Stroke Program Early CT Score) - Ganglionic level infarction (caudate, lentiform nuclei, internal capsule, insula, M1-M3 cortex): 7 - Supraganglionic infarction (M4-M6 cortex): 3 Total score (0-10 with 10 being normal): 10 IMPRESSION: 1. Positive for hyperdense right MCA suspicious for emergent large vessel occlusion. No acute intracranial hemorrhage or changes of acute cortically based infarct are identified. 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 2. ASPECTS is 10. 3. Study discussed by telephone with Dr. Lawana Pai on 09/27/2016 at 1225 hours. CTA is pending. Electronically Signed   By: Genevie Ann M.D.   On: 09/27/2016 12:28   Ir Angio Intra Extracran Sel Com Carotid Innominate Uni L Mod Sed  Result Date: 09/27/2016 INDICATION: 61 year old male presents with emergent large vessel occlusion of the right MCA. Given his high NIH stroke scale, baseline function, and within the window for treatment, he would like to proceed with cerebral angiogram and mechanical thrombectomy. EXAM: IR PERCUTANEOUS ART THORMBECTOMY/INFUSION INTRACRANIAL INCLUDE DIAG ANGIO; IR ANGIO INTRA EXTRACRAN SEL COM CAROTID INNOMINATE UNI LEFT MOD SED COMPARISON:  Noncontrast head CT 09/27/2016, cerebral CT angiogram 09/27/2016 MEDICATIONS: 2.0 g Ancef. The antibiotic was administered within 1 hour of the procedure ANESTHESIA/SEDATION: General endotracheal anesthesia CONTRAST:  190 cc Isovue FLUOROSCOPY TIME:  Fluoroscopy Time: 86  minutes 0 seconds (4316 mGy). COMPLICATIONS: SIR Level A - No therapy, no consequence. TECHNIQUE: Two physicians were involved with this case. Case was initiated with Dr. Estanislado Pandy, and was completed by Dr. Earleen Newport. Both physicians were present for the informed written consent, which was obtained from the patient after a thorough discussion of the procedural risks, benefits and alternatives. Specific risks discussed include: Bleeding, infection, contrast reaction, kidney injury/failure, need for further procedure/surgery, arterial injury or dissection, embolization to new territory, intracranial hemorrhage (10-15% risk), neurologic deterioration, cardiopulmonary collapse, death. All questions were addressed. Maximal Sterile Barrier Technique was utilized including during the procedure including caps, mask, sterile gowns, sterile gloves, sterile drape, hand hygiene and skin antiseptic. A timeout was performed prior to the initiation of the procedure. Dr. Estanislado Pandy initiated the procedure. Eleven blade was used to make a small incision at the skin site in the right inguinal region. A micropuncture needle was used access the right common femoral artery. With excellent arterial blood flow returned, an .018 micro wire was passed through the needle, observed to enter the abdominal aorta under fluoroscopy. The needle was removed, and a micropuncture sheath was placed over the wire. The inner dilator and wire were removed, and an 035 wire was advanced under fluoroscopy into the abdominal aorta. The sheath was removed and a standard 5 [place] vascular sheath was placed. The dilator was removed and the sheath was flushed. A 4F JB-1 diagnostic catheter was advanced over the wire to the proximal descending thoracic aorta. Wire was then removed. Double flush of the catheter was performed. Catheter was then used to select the innominate artery. Angiogram was performed. Using roadmap technique, the catheter was advanced over a  roadrunner wire into right common carotid artery. Formal angiogram was performed. Exchange length Rosen wire was then passed through the diagnostic catheter to the distal common carotid artery and the diagnostic catheter was removed. The 5 French sheath was removed and exchanged for 8 French 55 centimeter BrightTip sheath. Sheath was flushed and attached to pressurized and heparinized saline bag for constant forward flow. Then an 8 [place], 85 cm Flowgate balloon tip catheter was prepared on  the back table with inflation of the balloon with 50/50 concentration of dilute contrast. The balloon catheter was then advanced over the wire, positioned into the distal common carotid artery. Copious back flush was performed and the balloon catheter was attached to heparinized and pressurized saline bag for forward flow. Flowgate balloon catheter was then advanced over the road runner wire into the distal internal carotid artery. Angiogram was performed for road map guide. Microcatheter system was then introduced through an intermediate catheter, Ace 64, and the balloon guide catheter, using a synchro soft 014 wire and a Trevo Provue18 catheter. Microcatheter and intermediate catheter system was advanced into the internal carotid artery, to the level of the occlusion. The micro wire was then carefully advanced through the occluded segment. Microcatheter was then push through the occluded segment and the wire was removed. Blood was then aspirated through the hub of the microcatheter, and a gentle contrast injection was performed confirming intraluminal position. A rotating hemostatic valve was then attached to the back end of the microcatheter, and a pressurized and heparinized saline bag was attached to the catheter. 4 x 40 solitaire device was then selected. Back flush was achieved at the rotating hemostatic valve, and then the device was gently advanced through the microcatheter to the distal end. The retriever was then  unsheathed by withdrawing the microcatheter under fluoroscopy. Once the retriever was completely unsheathed, control angiogram was performed from the balloon catheter. The intermediate Ace 64 catheter was then advanced into the middle cerebral artery for local aspiration. Aspiration was then performed at the tip of the Ace catheter as the retriever was gently and slowly withdrawn with fluoroscopic observation. Once the retriever was entirely removed from the system, free aspiration was confirmed at the hub of the intermediate catheter, with free blood return confirmed. Control angiogram was performed. Given incomplete reperfusion, another attempt was initiated. Has the microcatheter system was advanced under road map fluoroscopic guidance into the right middle cerebral artery, it was recognized that there was prolapse of the system into the aortic arch. The entire balloon catheter, intermediate catheter, microcatheter system had prolapsed into the aortic arch and require removal. Dr Earleen Newport was then present for the remainder of the case. Once the system was double flushed, a JB 1 catheter was advanced into the aortic arch and used to select the common carotid artery. Control angiogram was performed. The catheter was then used to select the common carotid artery with control run performed. The JB 1 catheter and roadrunner wire were then advanced into the proximal internal carotid artery, and after confirming location with a control angiogram, the wire was removed. Rosen wire was then placed into the internal carotid artery and the JB catheter was removed. Again, 8 coaxial system of microcatheter, intermediate Ace 64 catheter, and the balloon guide were advanced over the Rosen wire into the internal carotid artery. Wire was removed and once the system was aspirated and flushed with pressurized heparin bags attached, a control angiogram was performed. The microcatheter and micro wire were then advanced across the  occlusion for a second thrombectomy attempt. The second attempt was also local aspiration through the penumbra catheter and use of 4 x 40 solitaire stentreiver. Control angiogram demonstrated proximal embolization of the distal MCA with decreased TICI perfusion. Third attempt was performed using similar technique, local aspiration and solitaire 4 x 40 stentriever. In total 5 passes were made with local aspiration via Ace 64 catheter and 4 x 40 stentriever. The final and sixth pass was  performed to re-establish flow through a dominant insular branch, using a 4 x 30 Trevo stentriever and a 64 aspiration. Trevo aspiration with proximal flow control (TRAP) technique was used on the final pass, with reestablished near complete, TICI 2b flow. Catheter and wires were removed from the right-sided cerebral circulation. JB 1 catheter was readvanced to the aortic arch, and angiogram of the left carotid system was performed including cerebral images. JB 1 catheter was removed. Control angiogram was performed at the right common femoral artery puncture site. The 8 French sheath was exchanged for a standard 9 French sheath at the common femoral artery puncture site. Patient tolerated the procedure well and remained hemodynamically stable throughout. No complications were encountered and no significant blood loss encountered. FINDINGS: Initial cerebral angiogram Right common carotid artery: Normal course caliber and contour. Mild atherosclerotic changes at the right carotid bulb with no significant stenosis. Minimal tortuosity of the right ICA. Right external carotid artery: Patent with antegrade flow. Right internal carotid artery: Minimal tortuosity of the right internal carotid. Right MCA: Occlusion of the proximal right MCA, with no significant plaque present at the cavernous segment, ophthalmic segment, to the terminus. Ophthalmic artery patent. Posterior communicating artery patent. There is a temporal branch patent just  proximal to the MCA occlusion. At the initiation of the case there is no significant collateral flow to the right MCA territory secondary to [facility] branches. Right ACA: A 1 segment patent. A 2 segment perfuses the right sided territory. No significant collateral filling of the right MCA territory upon initiation of the case. After the first pass, there is TICI 2b established. Persistent occlusion of a proximal insular branch with absence of MCA cortical branches. After the second pass, there was more proximal embolization of the clot, with decreased perfusion to the right MCA territory. TICI 1 Third - fifth passes were useful in reestablishing TICI 2a flow. The final and sixth pass targeting large proximal insular branch occlusion was successful in reestablishing TICI 2b flow. Left common carotid artery:  Normal course caliber and contour. Left external carotid artery: Patent with antegrade flow. Left internal carotid artery: Moderate atherosclerotic changes at the left carotid bulb with no significant narrowing on the lateral view and the anterior view demonstrating approximately 58%. No significant tortuosity of the left carotid system. Left MCA: M1 segment patent. Insular and opercular segments patent. Unremarkable caliber and course of the cortical segments. Typical arterial, capillary/ parenchymal, and venous phase. Left ACA: A 1 segment patent. A 2 segment perfuses the left territory. IMPRESSION: Status post cerebral angiogram with mechanical thrombectomy of proximal right MCA occlusion and reestablished near-complete, TICI 2b flow to the right hemisphere. Left cerebral angiogram demonstrating adequate perfusion. Left cervical angiogram demonstrates moderate carotid bulb disease, measuring approximately 58% stenosis. Signed, Dulcy Fanny. Earleen Newport, DO [facility] Rad[facility] Electronically Signed   By: Corrie Mckusick D.O.   On: 09/27/2016 17:56    Labs:  CBC:  Recent  Labs  02/09/16 0935 09/27/16 1205 09/27/16 1241 09/28/16 0421  WBC 5.9 5.7  --  7.0  HGB 13.2 13.7 12.6* 11.3*  HCT 40.0 41.2 37.0* 33.3*  PLT 176.0 140*  --  156    COAGS:  Recent Labs  09/27/16 1205  INR 1.10  APTT 29    BMP:  Recent Labs  02/09/16 0935 09/27/16 1241 09/27/16 1809 09/28/16 0421  NA 139 142 140 139  K 4.3 4.3 4.5 4.1  CL 106 107 111 109  CO2 31  --  23 22  GLUCOSE 105* 85 91 104*  BUN 18 18 14 11   CALCIUM 9.2  --  8.1* 8.4*  CREATININE 1.27 1.20 1.10 1.06  GFRNONAA  --   --  >60 >60  GFRAA  --   --  >60 >60    LIVER FUNCTION TESTS:  Recent Labs  02/09/16 0935  BILITOT 0.4  AST 25  ALT 11  ALKPHOS 47  PROT 7.2  ALBUMIN 3.8    Assessment and Plan:  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

## 2016-09-28 NOTE — Progress Notes (Signed)
OT Cancellation Note  Patient Details Name: [name] MRN: [id number] DOB: 03-Jan-1956   Cancelled Treatment:    Reason Eval/Treat Not Completed: Other (comment). Pt on strict bedrest. Please update activity orders when appropriate to initiate therapy. Thanks  [place], OT/L  [id number] 09/28/2016 09/28/2016, 7:37 AM

## 2016-09-28 NOTE — Progress Notes (Signed)
  Echocardiogram 2D Echocardiogram has been performed.  Donyale Berthold 09/28/2016, 10:14 AM

## 2016-09-28 NOTE — Progress Notes (Signed)
STROKE TEAM PROGRESS NOTE   HISTORY OF PRESENT ILLNESS (per record) [name] is an 61 y.o. male with multiple risk factors for stroke including diabetes, smoking, hypertension and hyperlipidemia. Patient was last seen normal at 1045 today 09/27/2016 ([facility]) when he attempted to use his left arm to lower the foot rest of his chair when he noticed that he could not move his arm or his leg. His friends also noticed that he had a left facial droop and dysarthria. Patient was brought to [facility] as a code stroke. Symptoms persisted. CT of head was obtained showing a right dense MCA sign this was confirmed with CT angiogram of head and neck. Patient was quickly started with TPA and patient signed consent for interventional radiology. Patient was administered IV t-PA 09/27/2016 at 1239. Cerebral angiogram confirm right MCA occlusion. Initial mechanical thrombectomy path restored TICI 2b flow. 2nd pass to open insular cortical flow resulted in mobilization of the clock proximal would decrease load to TICI 1. Additional passes were successful in reestablishing TICI 2b flow. He was admitted to the neuro ICU for further evaluation and treatment.   SUBJECTIVE (INTERVAL HISTORY) Wife at bedside. Patient is still intubated, however fully orientated and follow commands. Neuro examination normal, weakness resolved. Pending extubation and MRI.   OBJECTIVE Temp:  [97.8 F (36.6 C)-99.3 F (37.4 C)] 99.3 F (37.4 C) (02/14 1200) Pulse Rate:  [40-90] 45 (02/14 1400) Cardiac Rhythm: Sinus bradycardia (02/14 1230) Resp:  [16-24] 20 (02/14 1400) BP: (107-167)/(46-86) 117/46 (02/14 1400) SpO2:  [95 %-100 %] 98 % (02/14 1400) Arterial Line BP: (124-256)/(53-194) 149/53 (02/14 1400) FiO2 (%):  [40 %-60 %] 40 % (02/14 1000) Weight:  [178 lb 2.1 oz (80.8 kg)] 178 lb 2.1 oz (80.8 kg) (02/13 1642)  CBC:   Recent Labs Lab 09/27/16 1205 09/27/16 1241 09/28/16 0421  WBC 5.7  --  7.0  NEUTROABS 3.4  --    --   HGB 13.7 12.6* 11.3*  HCT 41.2 37.0* 33.3*  MCV 97.2  --  95.4  PLT 140*  --  [id number]    Basic Metabolic Panel:   Recent Labs Lab 09/27/16 1809 09/28/16 0421  NA 140 139  K 4.5 4.1  CL 111 109  CO2 23 22  GLUCOSE 91 104*  BUN 14 11  CREATININE 1.10 1.06  CALCIUM 8.1* 8.4*  MG 1.7  --   PHOS 4.1  --     Lipid Panel:     Component Value Date/Time   CHOL 107 09/28/2016 0421   TRIG 85 09/28/2016 0421   HDL 32 (L) 09/28/2016 0421   CHOLHDL 3.3 09/28/2016 0421   VLDL 17 09/28/2016 0421   LDLCALC 58 09/28/2016 0421   HgbA1c:  Lab Results  Component Value Date   HGBA1C 5.9 02/09/2016   Urine Drug Screen:     Component Value Date/Time   LABOPIA NONE DETECTED 09/28/2016 0638   COCAINSCRNUR NONE DETECTED 09/28/2016 0638   LABBENZ POSITIVE (A) 09/28/2016 0638   AMPHETMU NONE DETECTED 09/28/2016 0638   THCU POSITIVE (A) 09/28/2016 0638   LABBARB NONE DETECTED 09/28/2016 [id number]      IMAGING I have personally reviewed the radiological images below and agree with the radiology interpretations.  Ct Head Code Stroke W/o Cm 09/27/2016 1. Positive for hyperdense right MCA suspicious for emergent large vessel occlusion. No acute intracranial hemorrhage or changes of acute cortically based infarct are identified. Suspect a chronic cortically based infarct in the right inferior frontal  gyrus at the anterior insula. 2. ASPECTS is 10.   Ct Angio Head W Or Wo Contrast Ct Angio Neck W Or Wo Contrast 09/27/2016 1. 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 This was relayed via text pager to [facility] on 09/27/2016 at 1230 hours, and later we discussed by telephone. 2. Superimposed right carotid calcified atherosclerosis without hemodynamically significant stenosis. 3. However, there is High-grade (80%, approaching RADIOGRAPHIC STRING SIGN) stenosis at the left ICA origin. 4. Dominant left vertebral artery with up to moderate origin stenosis due to  calcified plaque. Non dominant right vertebral artery functionally terminates in PICA.   Cerebral angiogram 09/27/2016 Status post cerebral angiogram with mechanical thrombectomy of proximal right MCA occlusion and reestablished near-complete, TICI 2b flow to the right hemisphere. Left cerebral angiogram demonstrating adequate perfusion. Left cervical angiogram demonstrates moderate carotid bulb disease, measuring approximately 58% stenosis.   Ct Head Wo Contrast 09/27/2016 1. Post endovascular Neuro-intervention parenchymal staining in the right temporal and parietal lobes. No superimposed cytotoxic edema, acute intracranial hemorrhage or mass effect identified. 2. Small area of chronic encephalomalacia in the right anterior insula / inferior frontal gyrus.   Ct Head Wo Contrast 09/28/2016 Acute RIGHT basal ganglia nonhemorrhagic infarct. Residual RIGHT contrast staining. RIGHT inferior frontal lobe encephalomalacia. Moderate to severe atherosclerosis.   MRI and MRA head pending  TEE pending  LE venous Doppler pending   PHYSICAL EXAM  Temp:  [97.8 F (36.6 C)-99.3 F (37.4 C)] 99.3 F (37.4 C) (02/14 1200) Pulse Rate:  [40-90] 45 (02/14 1400) Resp:  [16-24] 20 (02/14 1400) BP: (107-167)/(46-86) 117/46 (02/14 1400) SpO2:  [95 %-100 %] 98 % (02/14 1400) Arterial Line BP: (124-256)/(53-194) 149/53 (02/14 1400) FiO2 (%):  [40 %-60 %] 40 % (02/14 1000) Weight:  [178 lb 2.1 oz (80.8 kg)] 178 lb 2.1 oz (80.8 kg) (02/13 1642)  General - Well nourished, well developed, intubated but fully alert.  Ophthalmologic - Fundi not visualized due to noncooperation.  Cardiovascular - Regular rate and rhythm.  Neuro - awake alert, still intubated, follow all midline and peripheral commands. Visual fields full, no hemianopia, PERRL, EOMI, facial symmetry not able to examine due to ET tube, tongue in middle position. 5/5 muscle strengths in all extremities, sensation symmetrical, FTN bilaterally  intact. Gait not tested.    ASSESSMENT/PLAN [name] is a 61 y.o. male with history of hypertension, diabetes, hyperlipidemia and smoking presenting with left body hemiparesis. He received IV t-PA on 09/27/2016 at 1239. He was taken to IR where he received TICI 2b revascularization of R MCA occlusion with mechanical thrombectomy.  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 Aspects = 10   CTA head and neck LVO R MCA M1. High-grade L ICA stenosis 80 % at origin. Moderate L VA  origin stenosis  Cerebral anigo R MCA occlusion s/p mechanical thrombectomy with reestablished TICI 2b flow  repeat am repeat - R basal ganglia infarct. Residual R contrast staining. R inferior frontal lobe encephalomalacia. Moderate to severe atherosclerosis   MRI  Pending  MRA pending  Lower extremity Doppler  pending  2D Echo  Pending  LDL 58  HgbA1c pending  SCDs for VTE prophylaxis Diet NPO time specified  aspirin 81 mg daily prior to  admission, now on No antithrombotic as within 24 hours of tPA. Plan resume aspirin if 24-hour imaging negative for hemorrhage  Ongoing aggressive stroke risk factor management  Therapy recommendations:  pending   Disposition:  Pending  Left carotid artery stenosis  Asymptomatic  Left ICA/CCA > 80% stenosis on CTA   Bilateral ICA siphons atherosclerosis  Outpatient follow-up with [facility]  Hypertension  Home meds - metoprolol  Post IR BP goal 140-150    Long-term BP goal normotensive  Hyperlipidemia  Home meds:  Zocor 40, resumed in hospital  LDL 58, goal < 70  Continue statin at discharge  Diabetes  HgbA1c pending, goal < 7.0  SSI  CBG monitoring    Tobacco abuse  Current smoker  Smoking cessation  counseling provided  Pt is willing to quit  Other Stroke Risk Factors  Cigarette smoker, advised to stop smoking  UDS positive for THC  CAD - MI, s/p CABG, stent  left ventricular dysfunction  Other Active Problems  Hx PE  Hx gastric bypass  Hospital day # 1  This patient is critically ill due to 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 This patient's care requires constant monitoring of vital signs, hemodynamics, respiratory and cardiac monitoring, review of multiple databases, neurological assessment, discussion with family, other specialists and medical decision making of high complexity. I spent 40 minutes of neurocritical care time in the care of this patient.  Rosalin Hawking, MD PhD Stroke Neurology 09/28/2016 2:38 PM   To contact Stroke Continuity provider, please refer to [email or web address] After hours, contact General Neurology

## 2016-09-28 NOTE — Progress Notes (Signed)
Visited w/ family and pt reportedly doing better per family member, providing emotional/spiritual support and prayer. Chaplain available for f/u.   09/28/16 1100  Clinical Encounter Type  Visited With Patient and family together  Visit Type Initial;Psychological support;Spiritual support;Social support  Referral From Chaplain  Spiritual Encounters  Spiritual Needs Prayer;Emotional  Stress Factors  Patient Stress Factors Health changes;Loss of control  Family Stress Factors Family relationships;Health changes;Loss of control   Gerrit Heck, Chaplain

## 2016-09-28 NOTE — Progress Notes (Signed)
Interventional Radiology here at bedside to remove Rt groin 9Fr sheath.  Moderate sized hematoma noted prior to sheath removal.  Exoseal attempted but, unsuccessful.  Manual pressure held until hemostasis achieved. Distal pulses +2 bilaterally.  V-Pad with Tegaderm in place along with pressure dressing.  RN at bedside, no immediate complications noted.  mja/jkc

## 2016-09-28 NOTE — Progress Notes (Signed)
SLP Cancellation Note  Patient Details Name: RIP SCHOENBAUER MRN: [id number] DOB: August 16, 1955   Cancelled treatment:       Reason Eval/Treat Not Completed: Medical issues which prohibited therapy (On vent.)  Gabriel Rainwater [place], CCC-SLP [phone]  Gabriel Rainwater Meryl 09/28/2016, 7:58 AM

## 2016-09-28 NOTE — Plan of Care (Signed)
Problem: Education: Goal: Knowledge of disease or condition will improve Outcome: Progressing Pt seems to acknowledge all education and instructions appropriately.  Communicates via writing while on vent.  Goal: Knowledge of secondary prevention will improve Outcome: Not Progressing Will discuss with patient in detail after pt is off ventilator   Problem: Nutrition: Goal: Risk of aspiration will decrease Outcome: Progressing No s/s of aspiration  Pt remains on vent.   Problem: Tissue Perfusion: Goal: Complications of Ischemic Stroke will be minimized (choose ONE based on patient diagnosis) Outcome: Progressing nih 1

## 2016-09-29 ENCOUNTER — Inpatient Hospital Stay (HOSPITAL_COMMUNITY): Payer: Medicare HMO

## 2016-09-29 DIAGNOSIS — Z86711 Personal history of pulmonary embolism: Secondary | ICD-10-CM

## 2016-09-29 DIAGNOSIS — I63412 Cerebral infarction due to embolism of left middle cerebral artery: Secondary | ICD-10-CM

## 2016-09-29 DIAGNOSIS — I255 Ischemic cardiomyopathy: Secondary | ICD-10-CM

## 2016-09-29 DIAGNOSIS — Z7901 Long term (current) use of anticoagulants: Secondary | ICD-10-CM

## 2016-09-29 DIAGNOSIS — E785 Hyperlipidemia, unspecified: Secondary | ICD-10-CM

## 2016-09-29 DIAGNOSIS — I82A12 Acute embolism and thrombosis of left axillary vein: Secondary | ICD-10-CM

## 2016-09-29 DIAGNOSIS — I639 Cerebral infarction, unspecified: Secondary | ICD-10-CM

## 2016-09-29 DIAGNOSIS — I25709 Atherosclerosis of coronary artery bypass graft(s), unspecified, with unspecified angina pectoris: Secondary | ICD-10-CM

## 2016-09-29 LAB — BASIC METABOLIC PANEL
ANION GAP: 6 (ref 5–15)
BUN: 11 mg/dL (ref 6–20)
CO2: 26 mmol/L (ref 22–32)
Calcium: 8.5 mg/dL — ABNORMAL LOW (ref 8.9–10.3)
Chloride: 110 mmol/L (ref 101–111)
Creatinine, Ser: 0.87 mg/dL (ref 0.61–1.24)
GFR calc Af Amer: >60 mL/min (ref >60)
GLUCOSE: 80 mg/dL (ref 65–99)
POTASSIUM: 4.1 mmol/L (ref 3.5–5.1)
Sodium: 142 mmol/L (ref 135–145)

## 2016-09-29 LAB — CBC
HCT: 31.4 % — ABNORMAL LOW (ref 39.0–52.0)
Hemoglobin: 10.4 g/dL — ABNORMAL LOW (ref 13.0–17.0)
MCH: 31.3 pg (ref 26.0–34.0)
MCHC: 33.1 g/dL (ref 30.0–36.0)
MCV: 94.6 fL (ref 78.0–100.0)
PLATELETS: 136 10*3/uL — AB (ref 150–400)
RBC: 3.32 MIL/uL — AB (ref 4.22–5.81)
RDW: 13.6 % (ref 11.5–15.5)
WBC: 5.5 10*3/uL (ref 4.0–10.5)

## 2016-09-29 LAB — GLUCOSE, CAPILLARY
GLUCOSE-CAPILLARY: 106 mg/dL — AB (ref 65–99)
Glucose-Capillary: 103 mg/dL — ABNORMAL HIGH (ref 65–99)
Glucose-Capillary: 82 mg/dL (ref 65–99)
Glucose-Capillary: 90 mg/dL (ref 65–99)

## 2016-09-29 LAB — HEMOGLOBIN A1C
HEMOGLOBIN A1C: 5.7 % — AB (ref 4.8–5.6)
Mean Plasma Glucose: 117 mg/dL

## 2016-09-29 MED ORDER — APIXABAN 5 MG PO TABS
10.0000 mg | ORAL_TABLET | ORAL | Status: AC
  Administered 2016-09-29: 10 mg via ORAL
  Filled 2016-09-29: qty 2

## 2016-09-29 MED ORDER — ASPIRIN EC 81 MG PO TBEC
81.0000 mg | DELAYED_RELEASE_TABLET | Freq: Every day | ORAL | Status: DC
  Administered 2016-09-30: 81 mg via ORAL
  Filled 2016-09-29: qty 1

## 2016-09-29 MED ORDER — APIXABAN 5 MG PO TABS: 5.0000 mg | ORAL_TABLET | Freq: Two times a day (BID) | ORAL | Status: DC

## 2016-09-29 MED ORDER — APIXABAN 5 MG PO TABS
10.0000 mg | ORAL_TABLET | Freq: Two times a day (BID) | ORAL | Status: DC
  Administered 2016-09-29 – 2016-09-30 (×2): 10 mg via ORAL
  Filled 2016-09-29 (×2): qty 2

## 2016-09-29 MED ORDER — IPRATROPIUM-ALBUTEROL 0.5-2.5 (3) MG/3ML IN SOLN: 3.0000 mL | RESPIRATORY_TRACT | Status: DC | PRN

## 2016-09-29 NOTE — Progress Notes (Signed)
*  Preliminary Results* Bilateral lower extremity venous duplex completed. Right lower extremiy is negative for deep vein thrombosis. There is evidence of acute versus age indeterminate deep vein thrombosis involving the left popliteal vein. There is no evidence of Baker's cyst bilaterally.  Preliminary results discussed with Dr. Erlinda Hong.  09/29/2016 9:02 AM Maudry Mayhew, BS, RVT, RDCS, RDMS

## 2016-09-29 NOTE — Progress Notes (Signed)
Referring Physician(s): Dr Lavera Guise  Supervising Physician: Corrie Mckusick  Patient Status:  [facility] - In-pt  Chief Complaint:  CVA - initial image confirms R MCA occlusion, with only temporal branch patency - Initial pass results in TICI 2B flow, with persisting proximal occlusion of large insular branch - 2nd pass to open the insular and cortical flow resulted in mobilization of the clot proximal, with decreased TICI flow to TICI 1 flow.. - 4 extra passes were successful in re-establishing TICI 2b flow  Subjective:  Procedure performed 2/13 Doing so well Up at side of bed Just back from PT Denies headache; denies pain Eating well Doppler of LE: Bilateral lower extremity venous duplex completed. Right lower extremiy is negative for deep vein thrombosis. There is evidence of acute versus age indeterminate deep vein thrombosis involving the left popliteal vein. There is no evidence of Baker's cyst bilaterally.  Allergies: Codeine  Medications: Prior to Admission medications   Medication Sig Start Date End Date Taking? Authorizing Provider  ALPRAZolam Duanne Moron) 1 MG tablet TAKE ONE TABLET BY MOUTH THREE TIMES DAILY AS NEEDED FOR ANXIETY 09/14/16  Yes Lucille Passy, MD  HYDROcodone-acetaminophen (NORCO) 10-325 MG tablet Take 1 tablet by mouth every 4 (four) hours as needed for severe pain. 09/14/16  Yes Lucille Passy, MD  metoprolol succinate (TOPROL-XL) 25 MG 24 hr tablet TAKE 1 TABLET BY MOUTH TWICE DAILY 06/03/16  Yes Lucille Passy, MD  aspirin 81 MG tablet Take 81 mg by mouth daily.      Historical Provider, MD  Calcium Citrate-Vitamin D 500-400 MG-UNIT CHEW Chew 1 tablet by mouth 2 (two) times daily.      Historical Provider, MD  Cholecalciferol (VITAMIN D) 2000 UNITS tablet Take 5,000 Units by mouth daily.     Historical Provider, MD  cyanocobalamin 500 MCG tablet Take 2,500 mcg by mouth daily.    Historical Provider, MD  docusate sodium (COLACE) 100 MG capsule Take 100 mg by mouth  as needed.      Historical Provider, MD  fexofenadine (ALLEGRA) 180 MG tablet Take 180 mg by mouth daily as needed. 12/27/11   Lucille Passy, MD  Multiple Vitamin (MULTIVITAMIN) tablet Take 1 tablet by mouth daily.      Historical Provider, MD  nitroGLYCERIN (NITROSTAT) 0.3 MG SL tablet Place 1 tablet (0.3 mg total) under the tongue every 5 (five) minutes as needed. 10/10/11   Lucille Passy, MD  omeprazole (PRILOSEC) 20 MG capsule Take 1 capsule (20 mg total) by mouth 2 (two) times daily before a meal. Patient not taking: Reported on 09/28/2016 01/08/15   Lucille Passy, MD  simvastatin (ZOCOR) 40 MG tablet TAKE 1 TABLET BY MOUTH AT BEDTIME FOR CHOLESTEROL Patient not taking: Reported on 09/28/2016 03/23/16   Lucille Passy, MD     Vital Signs: BP 104/66 (BP Location: Right Arm)   Pulse 69   Temp 98.2 F (36.8 C) (Oral)   Resp 20   Ht 5\' 9"  (1.753 m) Comment: Simultaneous filing. User may not have seen previous data.  Wt 178 lb 2.1 oz (80.8 kg)   SpO2 97%   BMI 26.31 kg/m   Physical Exam  Constitutional: He is oriented to person, place, and time.  Musculoskeletal: Normal range of motion.  Neurological: He is alert and oriented to person, place, and time.  Skin: Skin is warm and dry.  Psychiatric: He has a normal mood and affect. His behavior is normal. Thought content normal.  Nursing note and vitals reviewed.   Imaging: Ct Angio Head W Or Wo Contrast  Result Date: 09/27/2016 CLINICAL DATA:  61 year old male code stroke with suspicion of right MCA occlusion on noncontrast head CT. Initial encounter. EXAM: CT ANGIOGRAPHY HEAD AND NECK TECHNIQUE: Multidetector CT imaging of the head and neck was performed using the standard protocol during bolus administration of intravenous contrast. Multiplanar CT image reconstructions and MIPs were obtained to evaluate the vascular anatomy. Carotid stenosis measurements (when applicable) are obtained utilizing NASCET criteria, using the distal internal carotid  diameter as the denominator. CONTRAST:  50 mL Isovue 370 COMPARISON:  Noncontrast head CT 1214 hours today. FINDINGS: CTA NECK Skeleton: Absent maxillary dentition. Lower cervical ACDF hardware. Previous median sternotomy. No acute osseous abnormality identified. Upper chest: Centrilobular and paraseptal emphysema. Previous CABG. No superior mediastinal lymphadenopathy. Other neck: Negative thyroid, larynx, pharynx, parapharyngeal spaces, retropharyngeal space, sublingual space, submandibular glands and parotid glands. No cervical lymphadenopathy. Aortic arch: 3 vessel arch configuration. Mild to moderate mostly distal arch calcified atherosclerosis. Right carotid system: Negative brachiocephalic artery and right CCA origin. No right CCA stenosis. Calcified plaque at the right carotid bifurcation including in the right ICA origin and bulb, but no significant stenosis. Mildly tortuous cervical right ICA. Left carotid system: Negative left CCA origin. Calcified plaque along the anterior left CCA without stenosis proximal to the bifurcation. Bulky calcified plaque at the left carotid bifurcation resulting in high-grade left ICA origin stenosis (numerically estimated at 80 % with respect to the distal vessel) and approaching a RADIOGRAPHIC STRING SIGN. See series 5, image 120 and series 8, image 114. Calcified plaque continues in the left ICA bulb without additional stenosis. Vertebral arteries: No proximal right subclavian artery or right vertebral artery origins stenosis. Negative cervical right vertebral artery. Calcified plaque in the proximal left subclavian artery without stenosis. Bulky calcified plaque at the left vertebral artery origin resulting in mild to moderate origin stenosis (series 8, image 144). The left vertebral artery is mildly dominant, with no additional stenosis to the skullbase. CTA HEAD Posterior circulation: Dominant distal left vertebral artery primarily supplies the basilar. The non dominant  distal right vertebral artery is diminutive beyond the right PICA origin. There is mild left V4 segment calcified plaque without distal left vertebral artery stenosis. Mild irregularity at the vertebrobasilar junction does not appear hemodynamically significant. Mildly tortuous basilar artery without stenosis. SCA and PCA origins are patent. Posterior communicating arteries are diminutive or absent. Bilateral PCA branches appear within normal limits. Anterior circulation: Both ICA siphons are patent. There is fairly extensive bilateral siphon calcified plaque, but this does not appear to be hemodynamically significant. Both carotid termini are patent. MCA and ACA origins are normal. Diminutive or absent anterior communicating artery. Bilateral ACA branches, left MCA M1 segment, and left MCA branches appear within normal limits. The right MCA M1 segment is occluded 9-10 mm beyond its origin. See series 11, image 22 and series 9, image 18. While there is no enhancement at the right MCA bifurcationthere is some reconstitution of the right MCA branches as seen on series 9, image 16. Venous sinuses: Appear patent. Anatomic variants: Dominant left vertebral artery. Review of the MIP images confirms the above findings IMPRESSION: 1. 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 This was relayed via text pager to [facility] on 09/27/2016 at 1230 hours, and later we discussed by telephone. 2. Superimposed right carotid calcified atherosclerosis without hemodynamically significant stenosis. 3. However, there is High-grade (  80%, approaching RADIOGRAPHIC STRING SIGN) stenosis at the left ICA origin. 4. Dominant left vertebral artery with up to moderate origin stenosis due to calcified plaque. Non dominant right vertebral artery functionally terminates in PICA. Electronically Signed   By: Genevie Ann M.D.   On: 09/27/2016 12:46   Ct Head Wo Contrast  Result Date: 09/28/2016 CLINICAL DATA:  Status post  tPA administration EXAM: CT HEAD WITHOUT CONTRAST TECHNIQUE: Contiguous axial images were obtained from the base of the skull through the vertex without intravenous contrast. COMPARISON:  Head CT 09/28/2016 at 5:12 a.m. and 09/27/2016 FINDINGS: Brain: Area of focal hypoattenuation at the posterior right basal ganglia is again seen, consistent with acute infarct. The nearby date area of contrast staining shows decreased density, as expected. There is no acute hemorrhage. No new evidence of ischemia. No midline shift or mass effect. Vascular: Atherosclerotic calcification of the internal carotid arteries at the skull base. Skull: Incomplete fusion of the posterior C1 ring. Sinuses/Orbits: Atelectatic right maxillary sinus with mild mucosal thickening. Normal orbits Other: None. IMPRESSION: 1. Unchanged appearance of posterior right basal ganglia acute infarct. No acute hemorrhage. 2. Expected decreased density of contrast staining. Electronically Signed   By: Ulyses Jarred M.D.   On: 09/28/2016 15:32   Ct Head Wo Contrast  Result Date: 09/28/2016 CLINICAL DATA:  Followup stroke. EXAM: CT HEAD WITHOUT CONTRAST TECHNIQUE: Contiguous axial images were obtained from the base of the skull through the vertex without intravenous contrast. COMPARISON:  CT HEAD September 27, 2016 FINDINGS: BRAIN: Residual contrast staining RIGHT insula with density in the RIGHT sylvian fissure. Small infarct RIGHT posterior putaminal RIGHT inferior frontal lobe probable encephalomalacia. No definite intraparenchymal hemorrhage. No midline shift or mass effect. Ventricles are normal for patient's age. Basal cisterns are patent. VASCULAR: Moderate to severe calcific atherosclerosis of the carotid siphons. SKULL: No skull fracture. No significant scalp soft tissue swelling. SINUSES/ORBITS: RIGHT paranasal sinus mucosal thickening with atresia compatible with chronic sinusitis. Trace LEFT mastoid effusion. The included ocular globes and orbital  contents are non-suspicious. OTHER: Poor dentition. IMPRESSION: Acute RIGHT basal ganglia nonhemorrhagic infarct. Residual RIGHT contrast staining. RIGHT inferior frontal lobe encephalomalacia. Moderate to severe atherosclerosis. Electronically Signed   By: Elon Alas M.D.   On: 09/28/2016 05:32   Ct Head Wo Contrast  Result Date: 09/27/2016 CLINICAL DATA:  61 year old male with ELVO, right MCA occlusion status post endovascular reperfusion. Initial encounter. EXAM: CT HEAD WITHOUT CONTRAST TECHNIQUE: Contiguous axial images were obtained from the base of the skull through the vertex without intravenous contrast. COMPARISON:  Endovascular images earlier today. CTA head and neck 1227 hours today, and earlier. FINDINGS: Brain: Parenchymal staining in the posterior right temporal lobe, and some of the more posterior right MCA territory including the inferior right parietal lobe. No associated mass effect. No acute intracranial hemorrhage identified. No superimposed right MCA cytotoxic edema identified; chronic right inferior frontal gyrus encephalomalacia. No ventriculomegaly or intracranial mass effect. Vascular: Calcified atherosclerosis at the skull base. Residual intravascular contrast. Skull: No acute osseous abnormality identified. Sinuses/Orbits: Intubated. Stable mild right maxillary sinus mucosal thickening. Other: No acute orbit or scalp soft tissue findings. IMPRESSION: 1. Post endovascular Neuro-intervention parenchymal staining in the right temporal and parietal lobes. No superimposed cytotoxic edema, acute intracranial hemorrhage or mass effect identified. 2. Small area of chronic encephalomalacia in the right anterior insula / inferior frontal gyrus. Electronically Signed   By: Genevie Ann M.D.   On: 09/27/2016 16:39   Ct Angio Neck W Or  Wo Contrast  Result Date: 09/27/2016 CLINICAL DATA:  61 year old male code stroke with suspicion of right MCA occlusion on noncontrast head CT. Initial  encounter. EXAM: CT ANGIOGRAPHY HEAD AND NECK TECHNIQUE: Multidetector CT imaging of the head and neck was performed using the standard protocol during bolus administration of intravenous contrast. Multiplanar CT image reconstructions and MIPs were obtained to evaluate the vascular anatomy. Carotid stenosis measurements (when applicable) are obtained utilizing NASCET criteria, using the distal internal carotid diameter as the denominator. CONTRAST:  50 mL Isovue 370 COMPARISON:  Noncontrast head CT 1214 hours today. FINDINGS: CTA NECK Skeleton: Absent maxillary dentition. Lower cervical ACDF hardware. Previous median sternotomy. No acute osseous abnormality identified. Upper chest: Centrilobular and paraseptal emphysema. Previous CABG. No superior mediastinal lymphadenopathy. Other neck: Negative thyroid, larynx, pharynx, parapharyngeal spaces, retropharyngeal space, sublingual space, submandibular glands and parotid glands. No cervical lymphadenopathy. Aortic arch: 3 vessel arch configuration. Mild to moderate mostly distal arch calcified atherosclerosis. Right carotid system: Negative brachiocephalic artery and right CCA origin. No right CCA stenosis. Calcified plaque at the right carotid bifurcation including in the right ICA origin and bulb, but no significant stenosis. Mildly tortuous cervical right ICA. Left carotid system: Negative left CCA origin. Calcified plaque along the anterior left CCA without stenosis proximal to the bifurcation. Bulky calcified plaque at the left carotid bifurcation resulting in high-grade left ICA origin stenosis (numerically estimated at 80 % with respect to the distal vessel) and approaching a RADIOGRAPHIC STRING SIGN. See series 5, image 120 and series 8, image 114. Calcified plaque continues in the left ICA bulb without additional stenosis. Vertebral arteries: No proximal right subclavian artery or right vertebral artery origins stenosis. Negative cervical right vertebral  artery. Calcified plaque in the proximal left subclavian artery without stenosis. Bulky calcified plaque at the left vertebral artery origin resulting in mild to moderate origin stenosis (series 8, image 144). The left vertebral artery is mildly dominant, with no additional stenosis to the skullbase. CTA HEAD Posterior circulation: Dominant distal left vertebral artery primarily supplies the basilar. The non dominant distal right vertebral artery is diminutive beyond the right PICA origin. There is mild left V4 segment calcified plaque without distal left vertebral artery stenosis. Mild irregularity at the vertebrobasilar junction does not appear hemodynamically significant. Mildly tortuous basilar artery without stenosis. SCA and PCA origins are patent. Posterior communicating arteries are diminutive or absent. Bilateral PCA branches appear within normal limits. Anterior circulation: Both ICA siphons are patent. There is fairly extensive bilateral siphon calcified plaque, but this does not appear to be hemodynamically significant. Both carotid termini are patent. MCA and ACA origins are normal. Diminutive or absent anterior communicating artery. Bilateral ACA branches, left MCA M1 segment, and left MCA branches appear within normal limits. The right MCA M1 segment is occluded 9-10 mm beyond its origin. See series 11, image 22 and series 9, image 18. While there is no enhancement at the right MCA bifurcationthere is some reconstitution of the right MCA branches as seen on series 9, image 16. Venous sinuses: Appear patent. Anatomic variants: Dominant left vertebral artery. Review of the MIP images confirms the above findings IMPRESSION: 1. 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 This was relayed via text pager to [facility] on 09/27/2016 at 1230 hours, and later we discussed by telephone. 2. Superimposed right carotid calcified atherosclerosis without hemodynamically significant  stenosis. 3. However, there is High-grade (80%, approaching RADIOGRAPHIC STRING SIGN) stenosis at the left ICA origin. 4.  Dominant left vertebral artery with up to moderate origin stenosis due to calcified plaque. Non dominant right vertebral artery functionally terminates in PICA. Electronically Signed   By: Genevie Ann M.D.   On: 09/27/2016 12:46   Mr Jodene Nam Head Wo Contrast  Result Date: 09/28/2016 CLINICAL DATA:  61 y/o M; left-sided weakness and slurred speech. Right MCA occlusion status post endovascular repair fusion. EXAM: MRI HEAD WITHOUT CONTRAST MRA HEAD WITHOUT CONTRAST TECHNIQUE: Multiplanar, multiecho pulse sequences of the brain and surrounding structures were obtained without intravenous contrast. Angiographic images of the head were obtained using MRA technique without contrast. COMPARISON:  09/28/2016 CT head. 09/27/2016 CT angiogram. 09/27/2016 CT angiogram head. FINDINGS: MRI HEAD FINDINGS Brain: Foci of diffusion restriction are present within the right mid caudate body, right posterior lentiform nucleus, intermittently throughout the cortex of right insula and temporal operculum, and 2 punctate foci in the right parietal cortex compatible with acute infarction. Acute infarctions are associated with T2 hyperintense signal abnormality and minimal local mass effect. There is no susceptibility hypointensity to suggest hemorrhage. There is a background of mild chronic microvascular ischemic changes and parenchymal volume loss of the brain. No hydrocephalus or extra-axial collection. No significant mass effect of the brain. Vascular: Normal flow voids. Skull and upper cervical spine: Normal marrow signal. Sinuses/Orbits: Mild diffuse paranasal sinus disease greatest in the right maxillary sinus. Bilateral mastoid effusions. Orbits are unremarkable. Other: None. MRA HEAD FINDINGS Internal carotid arteries:  Patent. Anterior cerebral arteries:  Patent. Middle cerebral arteries: Patent left. Widely patent  right M1 segment and proximal M2 segments. There is irregularity of the right M2 inferior division in the sylvian fissure with intermittent mild stenosis (series 6 image 92- 101) and there is a 2 mm laterally directed outpouching at the origin of a posterior temporal M3 branch (series 6, image 82) which may represent an occluded branch vessel origin or tiny aneurysm. Short segment of severe stenosis of the origin of a right parietal M3 branch (series 6, image 89). Anterior communicating artery: Patent. Posterior communicating arteries: Patent right. No left identified. Posterior cerebral arteries:  Patent. Basilar artery:  Patent. Vertebral arteries: Patent. Fenestrated right vertebral artery just downstream to PICA origin. IMPRESSION: 1. Acute infarctions are present within the right mid caudate body, right posterior lentiform nucleus, intermittently throughout cortex of right insula and temporal operculum, and there are 2 punctate foci in the right parietal cortex. 2. No hemorrhage identified. 3. Background of mild chronic microvascular ischemic changes and parenchymal volume loss of the brain. 4. Widely patent right M1 segment and proximal right M2 segments. There is a long segment of irregularity of the right M2 inferior division in the sylvian fissure with intermittent mild stenosis. 5. 2 mm laterally directed outpouching at the origin of a posterior temporal M3 branch which may represent an occluded branch vessel origin or tiny aneurysm. 6. Short segment of severe stenosis of the origin of a right parietal M3 branch. 7. Otherwise normal MRA of the head. Electronically Signed   By: Kristine Garbe M.D.   On: 09/28/2016 23:00   Mr Brain Wo Contrast  Result Date: 09/28/2016 CLINICAL DATA:  61 y/o M; left-sided weakness and slurred speech. Right MCA occlusion status post endovascular repair fusion. EXAM: MRI HEAD WITHOUT CONTRAST MRA HEAD WITHOUT CONTRAST TECHNIQUE: Multiplanar, multiecho pulse  sequences of the brain and surrounding structures were obtained without intravenous contrast. Angiographic images of the head were obtained using MRA technique without contrast. COMPARISON:  09/28/2016 CT head. 09/27/2016 CT  angiogram. 09/27/2016 CT angiogram head. FINDINGS: MRI HEAD FINDINGS Brain: Foci of diffusion restriction are present within the right mid caudate body, right posterior lentiform nucleus, intermittently throughout the cortex of right insula and temporal operculum, and 2 punctate foci in the right parietal cortex compatible with acute infarction. Acute infarctions are associated with T2 hyperintense signal abnormality and minimal local mass effect. There is no susceptibility hypointensity to suggest hemorrhage. There is a background of mild chronic microvascular ischemic changes and parenchymal volume loss of the brain. No hydrocephalus or extra-axial collection. No significant mass effect of the brain. Vascular: Normal flow voids. Skull and upper cervical spine: Normal marrow signal. Sinuses/Orbits: Mild diffuse paranasal sinus disease greatest in the right maxillary sinus. Bilateral mastoid effusions. Orbits are unremarkable. Other: None. MRA HEAD FINDINGS Internal carotid arteries:  Patent. Anterior cerebral arteries:  Patent. Middle cerebral arteries: Patent left. Widely patent right M1 segment and proximal M2 segments. There is irregularity of the right M2 inferior division in the sylvian fissure with intermittent mild stenosis (series 6 image 92- 101) and there is a 2 mm laterally directed outpouching at the origin of a posterior temporal M3 branch (series 6, image 82) which may represent an occluded branch vessel origin or tiny aneurysm. Short segment of severe stenosis of the origin of a right parietal M3 branch (series 6, image 89). Anterior communicating artery: Patent. Posterior communicating arteries: Patent right. No left identified. Posterior cerebral arteries:  Patent. Basilar  artery:  Patent. Vertebral arteries: Patent. Fenestrated right vertebral artery just downstream to PICA origin. IMPRESSION: 1. Acute infarctions are present within the right mid caudate body, right posterior lentiform nucleus, intermittently throughout cortex of right insula and temporal operculum, and there are 2 punctate foci in the right parietal cortex. 2. No hemorrhage identified. 3. Background of mild chronic microvascular ischemic changes and parenchymal volume loss of the brain. 4. Widely patent right M1 segment and proximal right M2 segments. There is a long segment of irregularity of the right M2 inferior division in the sylvian fissure with intermittent mild stenosis. 5. 2 mm laterally directed outpouching at the origin of a posterior temporal M3 branch which may represent an occluded branch vessel origin or tiny aneurysm. 6. Short segment of severe stenosis of the origin of a right parietal M3 branch. 7. Otherwise normal MRA of the head. Electronically Signed   By: Kristine Garbe M.D.   On: 09/28/2016 23:00   Dg Chest Port 1 View  Result Date: 09/28/2016 CLINICAL DATA:  Respiratory failure. EXAM: PORTABLE CHEST 1 VIEW COMPARISON:  09/27/2016 . FINDINGS: Endotracheal tube in stable position. Prior CABG. Heart size stable. No focal infiltrate. No pleural effusion or pneumothorax. Prior cervical spine fusion. IMPRESSION: 1. Endotracheal tube in stable position. 2. Prior CABG. Heart size stable. No acute cardiopulmonary disease identified . Chest is stable from prior exam. Electronically Signed   By: Marcello Moores  Register   On: 09/28/2016 07:53   Dg Chest Port 1 View  Result Date: 09/27/2016 CLINICAL DATA:  Endotracheal tube placement.  Initial encounter. EXAM: PORTABLE CHEST 1 VIEW COMPARISON:  Chest radiograph performed 09/02/2013 FINDINGS: The patient's endotracheal tube is seen ending 5 cm above the carina. The lungs are well-aerated and clear. There is no evidence of focal opacification,  pleural effusion or pneumothorax. The cardiomediastinal silhouette is normal in size. The patient is status post median sternotomy. No acute osseous abnormalities are seen. IMPRESSION: 1. Endotracheal tube seen ending 5 cm above the carina. 2. No acute cardiopulmonary process seen.  Electronically Signed   By: Garald Balding M.D.   On: 09/27/2016 18:32   Ir Percutaneous Art Thrombectomy/infusion Intracranial Inc Diag Angio  Result Date: 09/27/2016 INDICATION: 61 year old male presents with emergent large vessel occlusion of the right MCA. Given his high NIH stroke scale, baseline function, and within the window for treatment, he would like to proceed with cerebral angiogram and mechanical thrombectomy. EXAM: IR PERCUTANEOUS ART THORMBECTOMY/INFUSION INTRACRANIAL INCLUDE DIAG ANGIO; IR ANGIO INTRA EXTRACRAN SEL COM CAROTID INNOMINATE UNI LEFT MOD SED COMPARISON:  Noncontrast head CT 09/27/2016, cerebral CT angiogram 09/27/2016 MEDICATIONS: 2.0 g Ancef. The antibiotic was administered within 1 hour of the procedure ANESTHESIA/SEDATION: General endotracheal anesthesia CONTRAST:  190 cc Isovue FLUOROSCOPY TIME:  Fluoroscopy Time: 86 minutes 0 seconds (4316 mGy). COMPLICATIONS: SIR Level A - No therapy, no consequence. TECHNIQUE: Two physicians were involved with this case. Case was initiated with Dr. Estanislado Pandy, and was completed by Dr. Earleen Newport. Both physicians were present for the informed written consent, which was obtained from the patient after a thorough discussion of the procedural risks, benefits and alternatives. Specific risks discussed include: Bleeding, infection, contrast reaction, kidney injury/failure, need for further procedure/surgery, arterial injury or dissection, embolization to new territory, intracranial hemorrhage (10-15% risk), neurologic deterioration, cardiopulmonary collapse, death. All questions were addressed. Maximal Sterile Barrier Technique was utilized including during the procedure  including caps, mask, sterile gowns, sterile gloves, sterile drape, hand hygiene and skin antiseptic. A timeout was performed prior to the initiation of the procedure. Dr. Estanislado Pandy initiated the procedure. Eleven blade was used to make a small incision at the skin site in the right inguinal region. A micropuncture needle was used access the right common femoral artery. With excellent arterial blood flow returned, an .018 micro wire was passed through the needle, observed to enter the abdominal aorta under fluoroscopy. The needle was removed, and a micropuncture sheath was placed over the wire. The inner dilator and wire were removed, and an 035 wire was advanced under fluoroscopy into the abdominal aorta. The sheath was removed and a standard 5 [place] vascular sheath was placed. The dilator was removed and the sheath was flushed. A 46F JB-1 diagnostic catheter was advanced over the wire to the proximal descending thoracic aorta. Wire was then removed. Double flush of the catheter was performed. Catheter was then used to select the innominate artery. Angiogram was performed. Using roadmap technique, the catheter was advanced over a roadrunner wire into right common carotid artery. Formal angiogram was performed. Exchange length Rosen wire was then passed through the diagnostic catheter to the distal common carotid artery and the diagnostic catheter was removed. The 5 French sheath was removed and exchanged for 8 French 55 centimeter BrightTip sheath. Sheath was flushed and attached to pressurized and heparinized saline bag for constant forward flow. Then an 8 [place], 85 cm Flowgate balloon tip catheter was prepared on the back table with inflation of the balloon with 50/50 concentration of dilute contrast. The balloon catheter was then advanced over the wire, positioned into the distal common carotid artery. Copious back flush was performed and the balloon catheter was attached to heparinized and pressurized saline  bag for forward flow. Flowgate balloon catheter was then advanced over the road runner wire into the distal internal carotid artery. Angiogram was performed for road map guide. Microcatheter system was then introduced through an intermediate catheter, Ace 64, and the balloon guide catheter, using a synchro soft 014 wire and a Trevo Provue18 catheter. Microcatheter and intermediate catheter system  was advanced into the internal carotid artery, to the level of the occlusion. The micro wire was then carefully advanced through the occluded segment. Microcatheter was then push through the occluded segment and the wire was removed. Blood was then aspirated through the hub of the microcatheter, and a gentle contrast injection was performed confirming intraluminal position. A rotating hemostatic valve was then attached to the back end of the microcatheter, and a pressurized and heparinized saline bag was attached to the catheter. 4 x 40 solitaire device was then selected. Back flush was achieved at the rotating hemostatic valve, and then the device was gently advanced through the microcatheter to the distal end. The retriever was then unsheathed by withdrawing the microcatheter under fluoroscopy. Once the retriever was completely unsheathed, control angiogram was performed from the balloon catheter. The intermediate Ace 64 catheter was then advanced into the middle cerebral artery for local aspiration. Aspiration was then performed at the tip of the Ace catheter as the retriever was gently and slowly withdrawn with fluoroscopic observation. Once the retriever was entirely removed from the system, free aspiration was confirmed at the hub of the intermediate catheter, with free blood return confirmed. Control angiogram was performed. Given incomplete reperfusion, another attempt was initiated. Has the microcatheter system was advanced under road map fluoroscopic guidance into the right middle cerebral artery, it was  recognized that there was prolapse of the system into the aortic arch. The entire balloon catheter, intermediate catheter, microcatheter system had prolapsed into the aortic arch and require removal. Dr Earleen Newport was then present for the remainder of the case. Once the system was double flushed, a JB 1 catheter was advanced into the aortic arch and used to select the common carotid artery. Control angiogram was performed. The catheter was then used to select the common carotid artery with control run performed. The JB 1 catheter and roadrunner wire were then advanced into the proximal internal carotid artery, and after confirming location with a control angiogram, the wire was removed. Rosen wire was then placed into the internal carotid artery and the JB catheter was removed. Again, 8 coaxial system of microcatheter, intermediate Ace 64 catheter, and the balloon guide were advanced over the Rosen wire into the internal carotid artery. Wire was removed and once the system was aspirated and flushed with pressurized heparin bags attached, a control angiogram was performed. The microcatheter and micro wire were then advanced across the occlusion for a second thrombectomy attempt. The second attempt was also local aspiration through the penumbra catheter and use of 4 x 40 solitaire stentreiver. Control angiogram demonstrated proximal embolization of the distal MCA with decreased TICI perfusion. Third attempt was performed using similar technique, local aspiration and solitaire 4 x 40 stentriever. In total 5 passes were made with local aspiration via Ace 64 catheter and 4 x 40 stentriever. The final and sixth pass was performed to re-establish flow through a dominant insular branch, using a 4 x 30 Trevo stentriever and a 64 aspiration. Trevo aspiration with proximal flow control (TRAP) technique was used on the final pass, with reestablished near complete, TICI 2b flow. Catheter and wires were removed from the right-sided  cerebral circulation. JB 1 catheter was readvanced to the aortic arch, and angiogram of the left carotid system was performed including cerebral images. JB 1 catheter was removed. Control angiogram was performed at the right common femoral artery puncture site. The 8 French sheath was exchanged for a standard 9 French sheath at the common femoral artery  puncture site. Patient tolerated the procedure well and remained hemodynamically stable throughout. No complications were encountered and no significant blood loss encountered. FINDINGS: Initial cerebral angiogram Right common carotid artery: Normal course caliber and contour. Mild atherosclerotic changes at the right carotid bulb with no significant stenosis. Minimal tortuosity of the right ICA. Right external carotid artery: Patent with antegrade flow. Right internal carotid artery: Minimal tortuosity of the right internal carotid. Right MCA: Occlusion of the proximal right MCA, with no significant plaque present at the cavernous segment, ophthalmic segment, to the terminus. Ophthalmic artery patent. Posterior communicating artery patent. There is a temporal branch patent just proximal to the MCA occlusion. At the initiation of the case there is no significant collateral flow to the right MCA territory secondary to [facility] branches. Right ACA: A 1 segment patent. A 2 segment perfuses the right sided territory. No significant collateral filling of the right MCA territory upon initiation of the case. After the first pass, there is TICI 2b established. Persistent occlusion of a proximal insular branch with absence of MCA cortical branches. After the second pass, there was more proximal embolization of the clot, with decreased perfusion to the right MCA territory. TICI 1 Third - fifth passes were useful in reestablishing TICI 2a flow. The final and sixth pass targeting large proximal insular branch occlusion was successful in reestablishing TICI 2b flow. Left common  carotid artery:  Normal course caliber and contour. Left external carotid artery: Patent with antegrade flow. Left internal carotid artery: Moderate atherosclerotic changes at the left carotid bulb with no significant narrowing on the lateral view and the anterior view demonstrating approximately 58%. No significant tortuosity of the left carotid system. Left MCA: M1 segment patent. Insular and opercular segments patent. Unremarkable caliber and course of the cortical segments. Typical arterial, capillary/ parenchymal, and venous phase. Left ACA: A 1 segment patent. A 2 segment perfuses the left territory. IMPRESSION: Status post cerebral angiogram with mechanical thrombectomy of proximal right MCA occlusion and reestablished near-complete, TICI 2b flow to the right hemisphere. Left cerebral angiogram demonstrating adequate perfusion. Left cervical angiogram demonstrates moderate carotid bulb disease, measuring approximately 58% stenosis. Signed, Dulcy Fanny. Earleen Newport, DO [facility] Radiology Electronically Signed   By: Corrie Mckusick D.O.   On: 09/27/2016 17:56   Ct Head Code Stroke W/o Cm  Result Date: 09/27/2016 CLINICAL DATA:  Code stroke. 61 year old male with left side weakness and slurred speech. Initial encounter. EXAM: CT HEAD WITHOUT CONTRAST TECHNIQUE: Contiguous axial images were obtained from the base of the skull through the vertex without intravenous contrast. COMPARISON:  Cervical spine MRI 03/30/2014 FINDINGS: Brain: No acute intracranial hemorrhage identified. No midline shift, mass effect, or evidence of intracranial mass lesion. 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 There is some associated right external capsule hypodensity. No superimposed acute cortically based infarct changes are identified. No other cortical encephalomalacia. Gray-white  matter differentiation outside of the area above appears within normal limits. No ventriculomegaly. Vascular: Hyperdense right MCA (series 4, image 27). Skull: No acute osseous abnormality identified. Sinuses/Orbits: Mild mastoid effusion. Adenoid hypertrophy. Paranasal sinuses are clear except for right maxillary mucosal thickening. Other: Mild rightward gaze deviation. Negative orbit and scalp soft tissues otherwise. ASPECTS [facility] Stroke Program Early CT Score) - Ganglionic level infarction (caudate, lentiform nuclei, internal capsule, insula, M1-M3 cortex): 7 - Supraganglionic infarction (M4-M6 cortex): 3 Total score (0-10 with 10 being normal):  10 IMPRESSION: 1. Positive for hyperdense right MCA suspicious for emergent large vessel occlusion. No acute intracranial hemorrhage or changes of acute cortically based infarct are identified. 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 2. ASPECTS is 10. 3. Study discussed by telephone with Dr. Lawana Pai on 09/27/2016 at 1225 hours. CTA is pending. Electronically Signed   By: Genevie Ann M.D.   On: 09/27/2016 12:28   Ir Angio Intra Extracran Sel Com Carotid Innominate Uni L Mod Sed  Result Date: 09/27/2016 INDICATION: 61 year old male presents with emergent large vessel occlusion of the right MCA. Given his high NIH stroke scale, baseline function, and within the window for treatment, he would like to proceed with cerebral angiogram and mechanical thrombectomy. EXAM: IR PERCUTANEOUS ART THORMBECTOMY/INFUSION INTRACRANIAL INCLUDE DIAG ANGIO; IR ANGIO INTRA EXTRACRAN SEL COM CAROTID INNOMINATE UNI LEFT MOD SED COMPARISON:  Noncontrast head CT 09/27/2016, cerebral CT angiogram 09/27/2016 MEDICATIONS: 2.0 g Ancef. The antibiotic was administered within 1 hour of the procedure ANESTHESIA/SEDATION: General endotracheal anesthesia CONTRAST:  190 cc Isovue FLUOROSCOPY TIME:  Fluoroscopy Time: 86 minutes 0 seconds (4316  mGy). COMPLICATIONS: SIR Level A - No therapy, no consequence. TECHNIQUE: Two physicians were involved with this case. Case was initiated with Dr. Estanislado Pandy, and was completed by Dr. Earleen Newport. Both physicians were present for the informed written consent, which was obtained from the patient after a thorough discussion of the procedural risks, benefits and alternatives. Specific risks discussed include: Bleeding, infection, contrast reaction, kidney injury/failure, need for further procedure/surgery, arterial injury or dissection, embolization to new territory, intracranial hemorrhage (10-15% risk), neurologic deterioration, cardiopulmonary collapse, death. All questions were addressed. Maximal Sterile Barrier Technique was utilized including during the procedure including caps, mask, sterile gowns, sterile gloves, sterile drape, hand hygiene and skin antiseptic. A timeout was performed prior to the initiation of the procedure. Dr. Estanislado Pandy initiated the procedure. Eleven blade was used to make a small incision at the skin site in the right inguinal region. A micropuncture needle was used access the right common femoral artery. With excellent arterial blood flow returned, an .018 micro wire was passed through the needle, observed to enter the abdominal aorta under fluoroscopy. The needle was removed, and a micropuncture sheath was placed over the wire. The inner dilator and wire were removed, and an 035 wire was advanced under fluoroscopy into the abdominal aorta. The sheath was removed and a standard 5 [place] vascular sheath was placed. The dilator was removed and the sheath was flushed. A 30F JB-1 diagnostic catheter was advanced over the wire to the proximal descending thoracic aorta. Wire was then removed. Double flush of the catheter was performed. Catheter was then used to select the innominate artery. Angiogram was performed. Using roadmap technique, the catheter was advanced over a roadrunner wire into right  common carotid artery. Formal angiogram was performed. Exchange length Rosen wire was then passed through the diagnostic catheter to the distal common carotid artery and the diagnostic catheter was removed. The 5 French sheath was removed and exchanged for 8 French 55 centimeter BrightTip sheath. Sheath was flushed and attached to pressurized and heparinized saline bag for constant forward flow. Then an 8 [place], 85 cm Flowgate balloon tip catheter was prepared on the back table with inflation of the balloon with 50/50 concentration of dilute contrast. The balloon catheter was then advanced over the wire, positioned into the distal common carotid artery. Copious back flush was performed and the balloon catheter  was attached to heparinized and pressurized saline bag for forward flow. Flowgate balloon catheter was then advanced over the road runner wire into the distal internal carotid artery. Angiogram was performed for road map guide. Microcatheter system was then introduced through an intermediate catheter, Ace 64, and the balloon guide catheter, using a synchro soft 014 wire and a Trevo Provue18 catheter. Microcatheter and intermediate catheter system was advanced into the internal carotid artery, to the level of the occlusion. The micro wire was then carefully advanced through the occluded segment. Microcatheter was then push through the occluded segment and the wire was removed. Blood was then aspirated through the hub of the microcatheter, and a gentle contrast injection was performed confirming intraluminal position. A rotating hemostatic valve was then attached to the back end of the microcatheter, and a pressurized and heparinized saline bag was attached to the catheter. 4 x 40 solitaire device was then selected. Back flush was achieved at the rotating hemostatic valve, and then the device was gently advanced through the microcatheter to the distal end. The retriever was then unsheathed by withdrawing the  microcatheter under fluoroscopy. Once the retriever was completely unsheathed, control angiogram was performed from the balloon catheter. The intermediate Ace 64 catheter was then advanced into the middle cerebral artery for local aspiration. Aspiration was then performed at the tip of the Ace catheter as the retriever was gently and slowly withdrawn with fluoroscopic observation. Once the retriever was entirely removed from the system, free aspiration was confirmed at the hub of the intermediate catheter, with free blood return confirmed. Control angiogram was performed. Given incomplete reperfusion, another attempt was initiated. Has the microcatheter system was advanced under road map fluoroscopic guidance into the right middle cerebral artery, it was recognized that there was prolapse of the system into the aortic arch. The entire balloon catheter, intermediate catheter, microcatheter system had prolapsed into the aortic arch and require removal. Dr Earleen Newport was then present for the remainder of the case. Once the system was double flushed, a JB 1 catheter was advanced into the aortic arch and used to select the common carotid artery. Control angiogram was performed. The catheter was then used to select the common carotid artery with control run performed. The JB 1 catheter and roadrunner wire were then advanced into the proximal internal carotid artery, and after confirming location with a control angiogram, the wire was removed. Rosen wire was then placed into the internal carotid artery and the JB catheter was removed. Again, 8 coaxial system of microcatheter, intermediate Ace 64 catheter, and the balloon guide were advanced over the Rosen wire into the internal carotid artery. Wire was removed and once the system was aspirated and flushed with pressurized heparin bags attached, a control angiogram was performed. The microcatheter and micro wire were then advanced across the occlusion for a second thrombectomy  attempt. The second attempt was also local aspiration through the penumbra catheter and use of 4 x 40 solitaire stentreiver. Control angiogram demonstrated proximal embolization of the distal MCA with decreased TICI perfusion. Third attempt was performed using similar technique, local aspiration and solitaire 4 x 40 stentriever. In total 5 passes were made with local aspiration via Ace 64 catheter and 4 x 40 stentriever. The final and sixth pass was performed to re-establish flow through a dominant insular branch, using a 4 x 30 Trevo stentriever and a 64 aspiration. Trevo aspiration with proximal flow control (TRAP) technique was used on the final pass, with reestablished near complete, TICI  2b flow. Catheter and wires were removed from the right-sided cerebral circulation. JB 1 catheter was readvanced to the aortic arch, and angiogram of the left carotid system was performed including cerebral images. JB 1 catheter was removed. Control angiogram was performed at the right common femoral artery puncture site. The 8 French sheath was exchanged for a standard 9 French sheath at the common femoral artery puncture site. Patient tolerated the procedure well and remained hemodynamically stable throughout. No complications were encountered and no significant blood loss encountered. FINDINGS: Initial cerebral angiogram Right common carotid artery: Normal course caliber and contour. Mild atherosclerotic changes at the right carotid bulb with no significant stenosis. Minimal tortuosity of the right ICA. Right external carotid artery: Patent with antegrade flow. Right internal carotid artery: Minimal tortuosity of the right internal carotid. Right MCA: Occlusion of the proximal right MCA, with no significant plaque present at the cavernous segment, ophthalmic segment, to the terminus. Ophthalmic artery patent. Posterior communicating artery patent. There is a temporal branch patent just proximal to the MCA occlusion. At the  initiation of the case there is no significant collateral flow to the right MCA territory secondary to [facility] branches. Right ACA: A 1 segment patent. A 2 segment perfuses the right sided territory. No significant collateral filling of the right MCA territory upon initiation of the case. After the first pass, there is TICI 2b established. Persistent occlusion of a proximal insular branch with absence of MCA cortical branches. After the second pass, there was more proximal embolization of the clot, with decreased perfusion to the right MCA territory. TICI 1 Third - fifth passes were useful in reestablishing TICI 2a flow. The final and sixth pass targeting large proximal insular branch occlusion was successful in reestablishing TICI 2b flow. Left common carotid artery:  Normal course caliber and contour. Left external carotid artery: Patent with antegrade flow. Left internal carotid artery: Moderate atherosclerotic changes at the left carotid bulb with no significant narrowing on the lateral view and the anterior view demonstrating approximately 58%. No significant tortuosity of the left carotid system. Left MCA: M1 segment patent. Insular and opercular segments patent. Unremarkable caliber and course of the cortical segments. Typical arterial, capillary/ parenchymal, and venous phase. Left ACA: A 1 segment patent. A 2 segment perfuses the left territory. IMPRESSION: Status post cerebral angiogram with mechanical thrombectomy of proximal right MCA occlusion and reestablished near-complete, TICI 2b flow to the right hemisphere. Left cerebral angiogram demonstrating adequate perfusion. Left cervical angiogram demonstrates moderate carotid bulb disease, measuring approximately 58% stenosis. Signed, Dulcy Fanny. Earleen Newport, DO [facility] [place] [facility] Radiology Electronically Signed   By: Corrie Mckusick D.O.   On: 09/27/2016 17:56    Labs:  CBC:  Recent Labs  02/09/16 0935 09/27/16 1205  09/27/16 1241 09/28/16 0421 09/29/16 0508  WBC 5.9 5.7  --  7.0 5.5  HGB 13.2 13.7 12.6* 11.3* 10.4*  HCT 40.0 41.2 37.0* 33.3* 31.4*  PLT 176.0 140*  --  156 136*    COAGS:  Recent Labs  09/27/16 1205  INR 1.10  APTT 29    BMP:  Recent Labs  02/09/16 0935 09/27/16 1241 09/27/16 1809 09/28/16 0421 09/29/16 0508  NA 139 142 140 139 142  K 4.3 4.3 4.5 4.1 4.1  CL 106 107 111 109 110  CO2 31  --  23 22 26   GLUCOSE 105* 85 91 104* 80  BUN 18 18 14 11 11   CALCIUM 9.2  --  8.1* 8.4*  8.5*  CREATININE 1.27 1.20 1.10 1.06 0.87  GFRNONAA  --   --  >60 >60 >60  GFRAA  --   --  >60 >60 >60    LIVER FUNCTION TESTS:  Recent Labs  02/09/16 0935  BILITOT 0.4  AST 25  ALT 11  ALKPHOS 47  PROT 7.2  ALBUMIN 3.8    Assessment and Plan:  CVA Revascularization R MCA doing great Will hear from IR scheduler for 2 week follow up with Dr Earleen Newport  Electronically Signed: Monia Sabal A 09/29/2016, 10:31 AM   I spent a total of 15 Minutes at the the patient's bedside AND on the patient's hospital floor or unit, greater than 50% of which was counseling/coordinating care for CVA; R MCA revasc

## 2016-09-29 NOTE — Progress Notes (Signed)
STROKE TEAM PROGRESS NOTE   SUBJECTIVE (INTERVAL HISTORY) No family is at bedside. Patient is on his baseline. MRI showed right MCA patchy infarcts including BG. LE venous doppler showed left popliteal vein acute DVT. Started on eliquis. TCD bubble study negative for PFO. Pt had hx of PE on anticoagulation for short period of time. Therefore, pt needs to be on long term anticoagulation.     OBJECTIVE Temp:  [97.9 F (36.6 C)-98.5 F (36.9 C)] 98.1 F (36.7 C) (02/15 2145) Pulse Rate:  [48-95] 55 (02/15 2145) Cardiac Rhythm: Sinus bradycardia (02/15 1900) Resp:  [16-20] 18 (02/15 2145) BP: (102-118)/(44-70) 118/55 (02/15 2145) SpO2:  [97 %-99 %] 97 % (02/15 2145)  CBC:   Recent Labs Lab 09/27/16 1205  09/28/16 0421 09/29/16 0508  WBC 5.7  --  7.0 5.5  NEUTROABS 3.4  --   --   --   HGB 13.7  < > 11.3* 10.4*  HCT 41.2  < > 33.3* 31.4*  MCV 97.2  --  95.4 94.6  PLT 140*  --  156 136*  < > = values in this interval not displayed.  Basic Metabolic Panel:   Recent Labs Lab 09/27/16 1809 09/28/16 0421 09/29/16 0508  NA 140 139 142  K 4.5 4.1 4.1  CL 111 109 110  CO2 23 22 26   GLUCOSE 91 104* 80  BUN 14 11 11   CREATININE 1.10 1.06 0.87  CALCIUM 8.1* 8.4* 8.5*  MG 1.7  --   --   PHOS 4.1  --   --     Lipid Panel:     Component Value Date/Time   CHOL 107 09/28/2016 0421   TRIG 85 09/28/2016 0421   HDL 32 (L) 09/28/2016 0421   CHOLHDL 3.3 09/28/2016 0421   VLDL 17 09/28/2016 0421   LDLCALC 58 09/28/2016 0421   HgbA1c:  Lab Results  Component Value Date   HGBA1C 5.7 (H) 09/28/2016   Urine Drug Screen:     Component Value Date/Time   LABOPIA NONE DETECTED 09/28/2016 0638   COCAINSCRNUR NONE DETECTED 09/28/2016 0638   LABBENZ POSITIVE (A) 09/28/2016 0638   AMPHETMU NONE DETECTED 09/28/2016 0638   THCU POSITIVE (A) 09/28/2016 0638   LABBARB NONE DETECTED 09/28/2016 [id number]      IMAGING I have personally reviewed the radiological images below and agree with  the radiology interpretations.  Ct Head Code Stroke W/o Cm 09/27/2016 1. Positive for hyperdense right MCA suspicious for emergent large vessel occlusion. No acute intracranial hemorrhage or changes of acute cortically based infarct are identified. 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 2. ASPECTS is 10.   Ct Angio Head W Or Wo Contrast Ct Angio Neck W Or Wo Contrast 09/27/2016 1. 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 This was relayed via text pager to [facility] on 09/27/2016 at 1230 hours, and later we discussed by telephone. 2. Superimposed right carotid calcified atherosclerosis without hemodynamically significant stenosis. 3. However, there is High-grade (80%, approaching RADIOGRAPHIC STRING SIGN) stenosis at the left ICA origin. 4. Dominant left vertebral artery with up to moderate origin stenosis due to calcified plaque. Non dominant right vertebral artery functionally terminates in PICA.   Cerebral angiogram 09/27/2016 Status post cerebral angiogram with mechanical thrombectomy of proximal right MCA occlusion and reestablished near-complete, TICI 2b flow to the right hemisphere. Left cerebral angiogram demonstrating adequate perfusion. Left cervical angiogram demonstrates moderate carotid  bulb disease, measuring approximately 58% stenosis.   Ct Head Wo Contrast 09/27/2016 1. Post endovascular Neuro-intervention parenchymal staining in the right temporal and parietal lobes. No superimposed cytotoxic edema, acute intracranial hemorrhage or mass effect identified. 2. Small area of chronic encephalomalacia in the right anterior insula / inferior frontal gyrus.   Ct Head Wo Contrast 09/28/2016 Acute RIGHT basal ganglia nonhemorrhagic infarct. Residual RIGHT contrast staining. RIGHT inferior frontal lobe encephalomalacia. Moderate to severe atherosclerosis.   MRI and MRA head   09/28/2016 IMPRESSION: 1. Acute infarctions are present within the right mid caudate body, right posterior lentiform nucleus, intermittently throughout cortex of right insula and temporal operculum, and there are 2 punctate foci in the right parietal cortex. 2. No hemorrhage identified. 3. Background of mild chronic microvascular ischemic changes and parenchymal volume loss of the brain. 4. Widely patent right M1 segment and proximal right M2 segments. There is a long segment of irregularity of the right M2 inferior division in the sylvian fissure with intermittent mild stenosis. 5. 2 mm laterally directed outpouching at the origin of a posterior temporal M3 branch which may represent an occluded branch vessel origin or tiny aneurysm. 6. Short segment of severe stenosis of the origin of a right parietal M3 branch. 7. Otherwise normal MRA of the head.   TTE - Left ventricle: The cavity size was moderately dilated. Systolic   function was moderately reduced. The estimated ejection fraction   was in the range of 35% to 40%. Diffuse hypokinesis worse in the   inferior and inferolateral walls. Doppler parameters are   consistent with abnormal left ventricular relaxation (grade 1   diastolic dysfunction). Doppler parameters are consistent with   indeterminate ventricular filling pressure. - Aortic valve: Transvalvular velocity was within the normal range.   There was no stenosis. There was no regurgitation. - Mitral valve: Transvalvular velocity was within the normal range.   There was no evidence for stenosis. There was trivial   regurgitation. - Left atrium: The atrium was severely dilated. - Right ventricle: The cavity size was normal. Wall thickness was   normal. Systolic function was normal. - Tricuspid valve: There was no regurgitation.  LE venous Doppler - left popliteal acute vs. Time indeterminate DVT but not chronic.   TCD bubble study - negative for PFO   PHYSICAL EXAM  Temp:  [97.9  F (36.6 C)-98.5 F (36.9 C)] 98.1 F (36.7 C) (02/15 2145) Pulse Rate:  [48-95] 55 (02/15 2145) Resp:  [16-20] 18 (02/15 2145) BP: (102-118)/(44-70) 118/55 (02/15 2145) SpO2:  [97 %-99 %] 97 % (02/15 2145)  General - Thin, well developed, not in acute distress.  Ophthalmologic - Fundi not visualized due to noncooperation.  Cardiovascular - Regular rate and rhythm.  Mental Status -  Level of arousal and orientation to time, place, and person were intact. Language including expression, naming, repetition, comprehension was assessed and found intact. Attention span and concentration were normal. Fund of Knowledge was assessed and was intact.  Cranial Nerves II - XII - II - Visual field intact OU. III, IV, VI - Extraocular movements intact. V - Facial sensation intact bilaterally. VII - Facial movement intact bilaterally. VIII - Hearing & vestibular intact bilaterally. X - Palate elevates symmetrically. XI - Chin turning & shoulder shrug intact bilaterally. XII - Tongue protrusion intact.  Motor Strength - The patient's strength was normal in all extremities and pronator drift was absent.  Bulk was normal and fasciculations were absent.   Motor Tone - Muscle  tone was assessed at the neck and appendages and was normal.  Reflexes - The patient's reflexes were 1+ in all extremities and he had no pathological reflexes.  Sensory - Light touch, temperature/pinprick were assessed and were symmetrical.    Coordination - The patient had normal movements in the hands and feet with no ataxia or dysmetria.  Tremor was absent.  Gait and Station - The patient's transfers, posture, gait, station, and turns were observed as normal.   ASSESSMENT/PLAN [name] is a 61 y.o. male with history of hypertension, diabetes, hyperlipidemia, PE, CAD s/p CABG x 4, and smoking presenting with left body hemiparesis. He received IV t-PA on 09/27/2016 at 1239. He was taken to IR where he received  TICI 2b revascularization of R MCA occlusion with mechanical thrombectomy.  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 cardioembolic (low EF, ? afib)  Resultant  L hemiparesis  Code stroke CT hyperdense R MCA emergent LVO. Aspects = 10   CTA head and neck LVO R MCA M1. High-grade L ICA stenosis 80 % at origin. Moderate L VA  origin stenosis  Cerebral anigo R MCA occlusion s/p mechanical thrombectomy with reestablished TICI 2b flow  repeat am repeat - R basal ganglia infarct. Residual R contrast staining. R inferior frontal lobe encephalomalacia. Moderate to severe atherosclerosis   MRI right MCA patchy infarct   MRA unremarkable after mechanical thrombectomy  Lower extremity Doppler  Left popliteal vein acute / age undetermined DVT  TCD bubble study - no PFO  2D Echo  EF 35-40%, Diffuse hypokinesis  TEE and loop cancelled due to pt already on long term anticoagulation and TEE/loop result will not change management.  LDL 58  HgbA1c 5.7  SCDs for VTE prophylaxis Diet Heart Room service appropriate? Yes; Fluid consistency: Thin Diet NPO time specified  aspirin 81 mg daily prior to admission, now on Eliquis for DVT treatment. Will continue eliquis life long due to recurrent DVT/PE. Continue ASA 81mg  for cardiac prevention.  Ongoing aggressive stroke risk factor management  Therapy recommendations:  none  Disposition:  Pending  Left carotid artery stenosis  Asymptomatic  Left ICA/CCA > 80% stenosis on CTA   Bilateral ICA siphons atherosclerosis  Outpatient follow-up with [facility]  Recurrent PE/DVT  Pt had hx of PE and was on anticoagulation for short time  LE venous doppler showed left popliteal vein DVT  TCD bubble study no PFO  Pt is on eliquis for DVT treatment  Long term anticoagulation  indicated for recurrent unprovoked DVT/PE  CAD / MI, s/p CABG, stent  left ventricular dysfunction, EF 35-40%  Bradycardia with frequent PVCs  Avoid beta blocker  Continue ASA on top of eliquis  Hypertension  Home meds - metoprolol  Now on no BP meds  BP stable  Avoid beta blocker due to bradycardia Long-term BP goal normotensive  Hyperlipidemia  Home meds:  Zocor 40, resumed in hospital  LDL 58, goal < 70  Continue statin at discharge  Diabetes  HgbA1c 5.7, goal < 7.0  SSI  CBG monitoring    Tobacco abuse  Current smoker  Smoking cessation counseling provided  Pt is willing to quit  Other Stroke Risk Factors  UDS positive for THC, couselled for quit  Other Active Problems  Hx gastric bypass  Hospital day # 2  Rosalin Hawking, MD  PhD Stroke Neurology 09/29/2016 10:09 PM   To contact Stroke Continuity provider, please refer to [email or web address] After hours, contact General Neurology

## 2016-09-29 NOTE — Evaluation (Signed)
Speech Language Pathology Evaluation Patient Details Name: [name] MRN: [id number] DOB: [birth year] Today's Date: 09/29/2016 Time: [id number] SLP Time Calculation (min) (ACUTE ONLY): 24 min  Problem List:  Patient Active Problem List   Diagnosis Date Noted  . Cerebrovascular accident (CVA) due to embolism of left middle cerebral artery ([place])   . CVA (cerebral vascular accident) ([place]) 09/27/2016  . Acute respiratory failure ([place])   . Generalized anxiety disorder 02/09/2016  . Constipation 01/08/2015  . Other nonspecific abnormal finding of lung field 09/02/2013  . Chronic pain syndrome 09/02/2013  . Vitamin D deficiency 09/03/2009  . CAD, ARTERY BYPASS GRAFT 05/20/2009  . Diabetes mellitus type 2, diet-controlled ([place]) 12/11/2006  . HLD (hyperlipidemia) 12/11/2006  . Iron deficiency anemia 12/11/2006  . TOBACCO ABUSE 12/11/2006  . Essential hypertension 12/11/2006  . MYOCARDIAL INFARCTION, HX OF 12/11/2006  . CARDIOMYOPATHY, ISCHEMIC 12/11/2006  . PULMONARY EMBOLISM, HX OF 12/11/2006   Past Medical History:  Past Medical History:  Diagnosis Date  . Anemia   . Arthritis   . CAD (coronary artery disease)   . Diabetes mellitus   . Hyperlipidemia   . Hypertension   . Left ventricular dysfunction   . Low back pain   . MI (myocardial infarction)   . Obesity   . PE (pulmonary embolism)   . Tobacco abuse    Past Surgical History:  Past Surgical History:  Procedure Laterality Date  . CHOLECYSTECTOMY    . CORONARY ARTERY BYPASS GRAFT    . CORONARY STENT PLACEMENT     3 all in right side  . GASTRIC BYPASS    . HERNIA REPAIR    . IR GENERIC HISTORICAL  09/27/2016   IR ANGIO INTRA EXTRACRAN SEL COM CAROTID INNOMINATE UNI L MOD SED 09/27/2016 Luanne Bras, MD [facility]-INTERV RAD  . IR GENERIC HISTORICAL  09/27/2016   IR PERCUTANEOUS ART THROMBECTOMY/INFUSION INTRACRANIAL INC DIAG ANGIO 09/27/2016 Luanne Bras, MD [facility]INTER[facility]  . RADIOLOGY WITH ANESTHESIA N/A 09/27/2016    Procedure: RADIOLOGY WITH ANESTHESIA;  Surgeon: Luanne Bras, MD;  Location: [place];  Service: Radiology;  Laterality: N/A;   HPI:  61 y.o. male with multiple risk factors for stroke including diabetes, smoking, hypertension and hyperlipidemia. Patient was last seen normal at 1045 on 07/27/17 when he attempted to use his left arm to lower the foot rest of his chair when he noticed that he could not move his arm or his leg. His friends also noticed that he had a left facial droop and dysarthria. Patient was brought to [facility] as a code stroke. Symptoms persisted. CT of head was obtained showing a right dense MCA sign this was confirmed with CT angiogram of head and neck. Patient was quickly started with TPA and patient signed consent for interventional radiology   Assessment / Plan / Recommendation Clinical Impression   Pt appears to have resolved initial s/s of dysarthria and left facial droop as SLE conducted and found well within functional limits for speech (100% intelligible in complex conversation), auditory comprehension and cognition as he received a 26/30 (within normal range) score on the [facility] Cognitive Assessment).  ST not warranted at this time; will s/o for speech services.    SLP Assessment  Patient does not need any further Speech Language Pathology Services    Follow Up Recommendations  None    Frequency and Duration     n/a      SLP Evaluation Cognition  Overall Cognitive Status: Within Functional Limits  for tasks assessed Arousal/Alertness: Awake/alert Orientation Level: Oriented X4 Memory: Appears intact Awareness: Appears intact Problem Solving: Appears intact Safety/Judgment: Appears intact       Comprehension  Auditory Comprehension Overall Auditory Comprehension: Appears within functional limits for tasks assessed Yes/No Questions: Within Functional Limits Commands: Within Functional Limits Conversation: Complex Visual  Recognition/Discrimination Discrimination: Within Function Limits Reading Comprehension Reading Status: Within funtional limits    Expression Expression Primary Mode of Expression: Verbal Verbal Expression Overall Verbal Expression: Appears within functional limits for tasks assessed Initiation: No impairment Level of Generative/Spontaneous Verbalization: Conversation Repetition: No impairment Naming: No impairment Pragmatics: No impairment Non-Verbal Means of Communication: Not applicable Written Expression Dominant Hand: Right Written Expression: Within Functional Limits   Oral / Motor  Oral Motor/Sensory Function Overall Oral Motor/Sensory Function: Within functional limits Motor Speech Overall Motor Speech: Appears within functional limits for tasks assessed Respiration: Within functional limits Phonation: Normal Resonance: Within functional limits Articulation: Within functional limitis Intelligibility: Intelligible Motor Planning: Witnin functional limits Motor Speech Errors: Not applicable             Functional Assessment Tool Used:  (NOMS; clinical judgment) Functional Limitations: Other Speech Language Pathology Other Speech-Language Pathology Functional Limitation [phone]): 0 percent impaired, limited or restricted         Shawnay Bramel,PAT, M.S., CCC-SLP 09/29/2016, 2:16 PM

## 2016-09-29 NOTE — Progress Notes (Signed)
*  PRELIMINARY RESULTS* Vascular Ultrasound Transcranial Doppler with Bubbles has been completed with Dr. Erlinda Hong. There is no evidence of Patent Foramen Ovale.  09/29/2016 2:29 PM Maudry Mayhew, BS, RVT, RDCS, RDMS

## 2016-09-29 NOTE — Progress Notes (Signed)
ANTICOAGULATION CONSULT NOTE - Initial Consult  Pharmacy Consult for Eliquis Indication: DVT  Allergies  Allergen Reactions  . Codeine     REACTION: makes me "looney"    Patient Measurements: Height: 5\' 9"  (175.3 cm) (Simultaneous filing. User may not have seen previous data.) Weight: 178 lb 2.1 oz (80.8 kg) IBW/kg (Calculated) : 70.7  Vital Signs: Temp: 98.2 F (36.8 C) (02/15 0935) Temp Source: Oral (02/15 0935) BP: 104/66 (02/15 0935) Pulse Rate: 69 (02/15 0935)  Labs:  Recent Labs  09/27/16 1205  09/27/16 1241 09/27/16 1809 09/28/16 0421 09/29/16 0508  HGB 13.7  --  12.6*  --  11.3* 10.4*  HCT 41.2  --  37.0*  --  33.3* 31.4*  PLT 140*  --   --   --  156 136*  APTT 29  --   --   --   --   --   LABPROT 14.2  --   --   --   --   --   INR 1.10  --   --   --   --   --   CREATININE  --   < > 1.20 1.10 1.06 0.87  TROPONINI  --   --   --  <0.03 0.03*  --   < > = values in this interval not displayed.  Estimated Creatinine Clearance: 91.4 mL/min (by C-G formula based on SCr of 0.87 mg/dL).   Medical History: Past Medical History:  Diagnosis Date  . Anemia   . Arthritis   . CAD (coronary artery disease)   . Diabetes mellitus   . Hyperlipidemia   . Hypertension   . Left ventricular dysfunction   . Low back pain   . MI (myocardial infarction)   . Obesity   . PE (pulmonary embolism)   . Tobacco abuse     Assessment: CC/HPI: code stroke 2/13  PMH: anemia, OA, CAD, DM, HTN, HLD, PE  Anticoag: h/o PE noted. New LLE DVT to start Eliquis. Hgb down to 10.4. Will watch closely.  Goal of Therapy:  Therapeutic oral anticoagulation   Plan:  Eliquis 10mg  BID x 7d then 5mg  BID Recommend decrease ASA dose.   Khila Papp S. Alford Highland, PharmD, BCPS Clinical Staff Pharmacist Pager [phone]  Eilene Ghazi Stillinger 09/29/2016,1:20 PM

## 2016-09-29 NOTE — Evaluation (Signed)
Occupational Therapy Evaluation Patient Details Name: [name] MRN: [id number] DOB: 02-Sep-1955 Today's Date: 09/29/2016    History of Present Illness Patient presented with CVA on 07/28/2017. He was treated with TPA and had a procedure to rmeove his oclusion. He reports at this time he feels like he has no deficits. He has a left popliteal DVT. Per stroke team he is OK to mobilize. Per nursing he has been up in his room independently. PMH:    Clinical Impression   This 61 yo male admitted with above presents to acute OT at an independent level, acute OT will sign off.     Follow Up Recommendations  No OT follow up    Equipment Recommendations  None recommended by OT       Precautions / Restrictions Precautions Precautions: None Restrictions Weight Bearing Restrictions: No      Mobility Bed Mobility Overal bed mobility: Independent                Transfers Overall transfer level: Independent Equipment used: None                  Balance Overall balance assessment: Independent                                          ADL Overall ADL's : Independent                                             Vision Vision Assessment?: Yes Eye Alignment: Within Functional Limits Ocular Range of Motion: Within Functional Limits Alignment/Gaze Preference: Within Defined Limits Tracking/Visual Pursuits: Able to track stimulus in all quads without difficulty Saccades: Within functional limits Convergence: Within functional limits Visual Fields: No apparent deficits          Pertinent Vitals/Pain Pain Assessment: No/denies pain     Hand Dominance Right   Extremity/Trunk Assessment Upper Extremity Assessment Upper Extremity Assessment: Overall WFL for tasks assessed   Lower Extremity Assessment Lower Extremity Assessment: Overall WFL for tasks assessed (no diffenrece between left and right )       Communication  Communication Communication: No difficulties   Cognition Arousal/Alertness: Awake/alert Behavior During Therapy: WFL for tasks assessed/performed Overall Cognitive Status: Within Functional Limits for tasks assessed                                Home Living Family/patient expects to be discharged to:: Private residence Living Arrangements: Alone Available Help at Discharge: Family Type of Home: House             Bathroom Shower/Tub: Occupational psychologist: Standard     Home Equipment: None   Additional Comments: No steps at home per patient       Prior Functioning/Environment Level of Independence: Independent                       OT Goals(Current goals can be found in the care plan section) Acute Rehab OT Goals Patient Stated Goal: to go home   OT Frequency:                End of Session Equipment Utilized During Treatment:  (  none)  Activity Tolerance: Patient tolerated treatment well Patient left: in bed;with call bell/phone within reach   Time: [id number] OT Time Calculation (min): 9 min Charges:  OT General Charges $OT Visit: 1 Procedure OT Evaluation $OT Eval Low Complexity: 1 Procedure  Almon Register [id number] 09/29/2016, 1:19 PM

## 2016-09-29 NOTE — Progress Notes (Signed)
Physical Therapy Evaluation Patient Details Name: [name] MRN: [id number] DOB: Dec 27, 1955 Today's Date: 09/29/2016   History of Present Illness  Patient presented with CVA on 07/28/2017. He was treated with TPA and had a procedure to rmeove his oclusion. He reports at this time he feels like he has no deficits. He has a left popliteal DVT. Per stroke team he is OK to mobilize. Per nursing he has been up in his room independently. PMH:   Clinical Impression  Patients symptoms appear to have resolved. He was independent with all mobility. All neuro-tests were negative including tracking and peripheral vision. He was able to ambulate with head turns and change speed without loss of balance. He has no need for further skilled therapy.     Follow Up Recommendations No PT follow up    Equipment Recommendations       Recommendations for Other Services       Precautions / Restrictions Precautions Precautions: None Restrictions Weight Bearing Restrictions: No      Mobility  Bed Mobility Overal bed mobility: Independent                Transfers Overall transfer level: Independent Equipment used: None                Ambulation/Gait Ambulation/Gait assistance: Independent         Gait velocity interpretation: >2.62 ft/sec, indicative of independent community ambulator General Gait Details: Patient ambualted 200'. He was able to ambualte with head turns and nodding without loss of balance. He was able to increase speed and decrease speed without difficulty.   Stairs            Wheelchair Mobility    Modified Rankin (Stroke Patients Only) Modified Rankin (Stroke Patients Only) Pre-Morbid Rankin Score: No symptoms Modified Rankin: No significant disability     Balance Overall balance assessment: Independent                                           Pertinent Vitals/Pain Pain Assessment: No/denies pain    Home Living  Family/patient expects to be discharged to:: Private residence Living Arrangements: Alone Available Help at Discharge: Family Type of Home: House         Home Equipment: None Additional Comments: No steps at home per patient     Prior Function Level of Independence: Independent               Hand Dominance   Dominant Hand: Right    Extremity/Trunk Assessment   Upper Extremity Assessment Upper Extremity Assessment: Overall WFL for tasks assessed    Lower Extremity Assessment Lower Extremity Assessment: Overall WFL for tasks assessed (no diffenrece between left and right )       Communication   Communication: No difficulties  Cognition Arousal/Alertness: Awake/alert Behavior During Therapy: WFL for tasks assessed/performed Overall Cognitive Status: Within Functional Limits for tasks assessed                      General Comments      Exercises     Assessment/Plan    PT Assessment Patent does not need any further PT services  PT Problem List            PT Treatment Interventions      PT Goals (Current goals can be found in the Care Plan section)  Acute Rehab PT Goals Patient Stated Goal: to go home  PT Goal Formulation: With patient Time For Goal Achievement: 10/02/16 Potential to Achieve Goals: Good    Frequency     Barriers to discharge        Co-evaluation               End of Session Equipment Utilized During Treatment: Gait belt Activity Tolerance: Patient tolerated treatment well Patient left: in bed;with call bell/phone within reach Nurse Communication: Mobility status         Time: 1040-1100 PT Time Calculation (min) (ACUTE ONLY): 20 min   Charges:   PT Evaluation $PT Eval Low Complexity: 1 Procedure     PT G Codes:        [name] PT DPT  09/29/2016, 1:22 PM

## 2016-09-30 ENCOUNTER — Encounter (HOSPITAL_COMMUNITY)
Admission: EM | Disposition: A | Discharge status: Home / Self Care | Payer: Self-pay | Source: Home / Self Care | Attending: Neurology

## 2016-09-30 DIAGNOSIS — E119 Type 2 diabetes mellitus without complications: Secondary | ICD-10-CM

## 2016-09-30 DIAGNOSIS — I6522 Occlusion and stenosis of left carotid artery: Secondary | ICD-10-CM | POA: Diagnosis present

## 2016-09-30 DIAGNOSIS — I25700 Atherosclerosis of coronary artery bypass graft(s), unspecified, with unstable angina pectoris: Secondary | ICD-10-CM

## 2016-09-30 DIAGNOSIS — F1210 Cannabis abuse, uncomplicated: Secondary | ICD-10-CM | POA: Diagnosis present

## 2016-09-30 DIAGNOSIS — I2510 Atherosclerotic heart disease of native coronary artery without angina pectoris: Secondary | ICD-10-CM | POA: Diagnosis present

## 2016-09-30 DIAGNOSIS — I82432 Acute embolism and thrombosis of left popliteal vein: Secondary | ICD-10-CM | POA: Diagnosis present

## 2016-09-30 DIAGNOSIS — I10 Essential (primary) hypertension: Secondary | ICD-10-CM

## 2016-09-30 DIAGNOSIS — I2699 Other pulmonary embolism without acute cor pulmonale: Secondary | ICD-10-CM | POA: Diagnosis present

## 2016-09-30 DIAGNOSIS — Z86718 Personal history of other venous thrombosis and embolism: Secondary | ICD-10-CM

## 2016-09-30 DIAGNOSIS — I82402 Acute embolism and thrombosis of unspecified deep veins of left lower extremity: Secondary | ICD-10-CM

## 2016-09-30 LAB — BASIC METABOLIC PANEL
Anion gap: 7 (ref 5–15)
BUN: 13 mg/dL (ref 6–20)
CALCIUM: 8.8 mg/dL — AB (ref 8.9–10.3)
CO2: 28 mmol/L (ref 22–32)
Chloride: 105 mmol/L (ref 101–111)
Creatinine, Ser: 1.08 mg/dL (ref 0.61–1.24)
GFR calc Af Amer: >60 mL/min (ref >60)
GFR calc non Af Amer: >60 mL/min (ref >60)
Glucose, Bld: 83 mg/dL (ref 65–99)
Potassium: 3.5 mmol/L (ref 3.5–5.1)
SODIUM: 140 mmol/L (ref 135–145)

## 2016-09-30 LAB — CBC
HCT: 33.5 % — ABNORMAL LOW (ref 39.0–52.0)
Hemoglobin: 10.9 g/dL — ABNORMAL LOW (ref 13.0–17.0)
MCH: 31.1 pg (ref 26.0–34.0)
MCHC: 32.5 g/dL (ref 30.0–36.0)
MCV: 95.4 fL (ref 78.0–100.0)
Platelets: 157 10*3/uL (ref 150–400)
RBC: 3.51 MIL/uL — ABNORMAL LOW (ref 4.22–5.81)
RDW: 13.6 % (ref 11.5–15.5)
WBC: 5.8 10*3/uL (ref 4.0–10.5)

## 2016-09-30 LAB — GLUCOSE, CAPILLARY
Glucose-Capillary: 66 mg/dL (ref 65–99)
Glucose-Capillary: 67 mg/dL (ref 65–99)

## 2016-09-30 SURGERY — ECHOCARDIOGRAM, TRANSESOPHAGEAL
Anesthesia: Moderate Sedation

## 2016-09-30 MED ORDER — SIMVASTATIN 40 MG PO TABS: 40.0000 mg | ORAL_TABLET | Freq: Every day | ORAL | 2 refills | Status: DC

## 2016-09-30 MED ORDER — APIXABAN 5 MG PO TABS: 10.0000 mg | ORAL_TABLET | Freq: Two times a day (BID) | ORAL | 2 refills | Status: DC

## 2016-09-30 MED ORDER — DEXTROSE 50 % IV SOLN
INTRAVENOUS | Status: AC
  Administered 2016-09-30: 50 mL
  Filled 2016-09-30: qty 50

## 2016-09-30 NOTE — Discharge Summary (Signed)
Stroke Discharge Summary  Patient ID: [name]   MRN: [id number]      DOB: Apr 12, 1956  Date of Admission: 09/27/2016 Date of Discharge: 09/30/2016  Attending Physician:  Dennis Hawking, MD, Stroke MD Consultant(s):   Dr. Merrie Patterson, pulmonary/intensive care  Patient's PCP:  Dennis Norris, MD  DISCHARGE DIAGNOSIS:  Principal Problem:   Cerebrovascular accident (CVA) due to embolism of right middle cerebral artery ([name]) - s/ IV tPA and mechanical thrombectomy   DVT, left LE Active Problems:   Diabetes mellitus type 2, diet-controlled ([name])   HLD (hyperlipidemia)   TOBACCO ABUSE   Essential hypertension   MYOCARDIAL INFARCTION, HX OF   CAD, ARTERY BYPASS GRAFT   PULMONARY EMBOLISM, HX OF   CVA (cerebral vascular accident) ([name])   Acute respiratory failure ([name])   Left carotid stenosis   Recurrent pulmonary emboli ([name])   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  Past Medical History:  Diagnosis Date  . Anemia   . Arthritis   . CAD (coronary artery disease)   . Diabetes mellitus   . Hyperlipidemia   . Hypertension   . Left ventricular dysfunction   . Low back pain   . MI (myocardial infarction)   . Obesity   . PE (pulmonary embolism)   . Tobacco abuse    Past Surgical History:  Procedure Laterality Date  . CHOLECYSTECTOMY    . CORONARY ARTERY BYPASS GRAFT    . CORONARY STENT PLACEMENT     3 all in right side  . GASTRIC BYPASS    . HERNIA REPAIR    . IR GENERIC HISTORICAL  09/27/2016   IR ANGIO INTRA EXTRACRAN SEL COM CAROTID INNOMINATE UNI L MOD SED 09/27/2016 Dennis Bras, MD [facility]  . IR GENERIC HISTORICAL  09/27/2016   IR PERCUTANEOUS ART THROMBECTOMY/INFUSION INTRACRANIAL INC DIAG ANGIO 09/27/2016 Dennis Bras, MD [facility]  . RADIOLOGY WITH ANESTHESIA N/A 09/27/2016   Procedure:  RADIOLOGY WITH ANESTHESIA;  Surgeon: Dennis Bras, MD;  Location: [name];  Service: Radiology;  Laterality: N/A;    Allergies as of 09/30/2016      Reactions   Codeine    REACTION: makes me "looney"      Medication List    STOP taking these medications   metoprolol succinate 25 MG 24 hr tablet Commonly known as:  TOPROL-XL   omeprazole 20 MG capsule Commonly known as:  PRILOSEC     TAKE these medications   ALPRAZolam 1 MG tablet Commonly known as:  XANAX TAKE ONE TABLET BY MOUTH THREE TIMES DAILY AS NEEDED FOR ANXIETY   apixaban 5 MG Tabs tablet Commonly known as:  ELIQUIS Take 2 tablets (10 mg total) by mouth 2 (two) times daily.   aspirin 81 MG tablet Take 81 mg by mouth daily.   calcium citrate-vitamin D 500-400 MG-UNIT chewable tablet Chew 1 tablet by mouth 2 (two) times daily.   cyanocobalamin 500 MCG tablet Take 2,500 mcg by mouth daily.   docusate sodium 100 MG capsule Commonly known as:  COLACE Take 100 mg by mouth as needed.   fexofenadine 180 MG tablet Commonly known as:  ALLEGRA Take 180 mg by mouth daily as needed.   HYDROcodone-acetaminophen 10-325 MG tablet Commonly known as:  NORCO Take 1 tablet by mouth every 4 (four) hours as  needed for severe pain.   multivitamin tablet Take 1 tablet by mouth daily.   nitroGLYCERIN 0.3 MG SL tablet Commonly known as:  NITROSTAT Place 1 tablet (0.3 mg total) under the tongue every 5 (five) minutes as needed.   simvastatin 40 MG tablet Commonly known as:  ZOCOR Take 1 tablet (40 mg total) by mouth at bedtime. What changed:  See the new instructions.   Vitamin D 2000 units tablet Take 5,000 Units by mouth daily.       LABORATORY STUDIES CBC    Component Value Date/Time   WBC 5.8 09/30/2016 0405   RBC 3.51 (L) 09/30/2016 0405   HGB 10.9 (L) 09/30/2016 0405   HCT 33.5 (L) 09/30/2016 0405   PLT 157 09/30/2016 0405   MCV 95.4 09/30/2016 0405   MCH 31.1 09/30/2016 0405   MCHC 32.5 09/30/2016  0405   RDW 13.6 09/30/2016 0405   LYMPHSABS 1.5 09/27/2016 1205   MONOABS 0.5 09/27/2016 1205   EOSABS 0.2 09/27/2016 1205   BASOSABS 0.0 09/27/2016 1205   CMP    Component Value Date/Time   NA 140 09/30/2016 0405   K 3.5 09/30/2016 0405   CL 105 09/30/2016 0405   CO2 28 09/30/2016 0405   GLUCOSE 83 09/30/2016 0405   BUN 13 09/30/2016 0405   CREATININE 1.08 09/30/2016 0405   CALCIUM 8.8 (L) 09/30/2016 0405   PROT 7.2 02/09/2016 0935   ALBUMIN 3.8 02/09/2016 0935   AST 25 02/09/2016 0935   ALT 11 02/09/2016 0935   ALKPHOS 47 02/09/2016 0935   BILITOT 0.4 02/09/2016 0935   GFRNONAA >60 09/30/2016 0405   GFRAA >60 09/30/2016 0405   COAGS Lab Results  Component Value Date   INR 1.10 09/27/2016   Lipid Panel    Component Value Date/Time   CHOL 107 09/28/2016 0421   TRIG 85 09/28/2016 0421   HDL 32 (L) 09/28/2016 0421   CHOLHDL 3.3 09/28/2016 0421   VLDL 17 09/28/2016 0421   LDLCALC 58 09/28/2016 0421   HgbA1C  Lab Results  Component Value Date   HGBA1C 5.7 (H) 09/28/2016   Urinalysis    Component Value Date/Time   COLORURINE AMBER BIOCHEMICALS MAY BE AFFECTED BY COLOR (A) 05/07/2008 0828   APPEARANCEUR CLEAR 05/07/2008 0828   LABSPEC >1.030 (H) 05/07/2008 0828   PHURINE 6.0 05/07/2008 0828   GLUCOSEU NEGATIVE 05/07/2008 0828   HGBUR NEGATIVE 05/07/2008 0828   BILIRUBINUR SMALL (A) 05/07/2008 0828   KETONESUR 15 (A) 05/07/2008 0828   PROTEINUR NEGATIVE 05/07/2008 0828   UROBILINOGEN 1.0 05/07/2008 0828   NITRITE NEGATIVE 05/07/2008 0828   LEUKOCYTESUR  05/07/2008 0828    NEGATIVE MICROSCOPIC NOT DONE ON URINES WITH NEGATIVE PROTEIN, BLOOD, LEUKOCYTES, NITRITE, OR GLUCOSE <1000 mg/dL.   Urine Drug Screen     Component Value Date/Time   LABOPIA NONE DETECTED 09/28/2016 0638   COCAINSCRNUR NONE DETECTED 09/28/2016 0638   LABBENZ POSITIVE (A) 09/28/2016 0638   AMPHETMU NONE DETECTED 09/28/2016 0638   THCU POSITIVE (A) 09/28/2016 0638   LABBARB NONE  DETECTED 09/28/2016 [id number]    Alcohol Level No results found for: [facility]   SIGNIFICANT DIAGNOSTIC STUDIES I have personally reviewed the radiological images below and agree with the radiology interpretations.  Ct Head Code Stroke W/o Cm 09/27/2016 1. Positive for hyperdense right MCA suspicious for emergent large vessel occlusion. No acute intracranial hemorrhage or changes of acute cortically based infarct are identified. Suspect a chronic cortically based infarct in the right inferior frontal  gyrus at the anterior insula. 2. ASPECTS is 10.   Ct Angio Head W Or Wo Contrast Ct Angio Neck W Or Wo Contrast 09/27/2016 1. 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 This was relayed via text pager to [facility] on 09/27/2016 at 1230 hours, and later we discussed by telephone. 2. Superimposed right carotid calcified atherosclerosis without hemodynamically significant stenosis. 3. However, there is High-grade (80%, approaching RADIOGRAPHIC STRING SIGN) stenosis at the left ICA origin. 4. Dominant left vertebral artery with up to moderate origin stenosis due to calcified plaque. Non dominant right vertebral artery functionally terminates in PICA.   Cerebral angiogram 09/27/2016 Status post cerebral angiogram with mechanical thrombectomy of proximal right MCA occlusion and reestablished near-complete, TICI 2b flow to the right hemisphere. Left cerebral angiogram demonstrating adequate perfusion. Left cervical angiogram demonstrates moderate carotid bulb disease, measuring approximately 58% stenosis.   Ct Head Wo Contrast 09/27/2016 1. Post endovascular Neuro-intervention parenchymal staining in the right temporal and parietal lobes. No superimposed cytotoxic edema, acute intracranial hemorrhage or mass effect identified. 2. Small area of chronic encephalomalacia in the right anterior insula / inferior frontal gyrus.   Ct Head Wo Contrast 09/28/2016 Acute RIGHT basal ganglia  nonhemorrhagic infarct. Residual RIGHT contrast staining. RIGHT inferior frontal lobe encephalomalacia. Moderate to severe atherosclerosis.   MRI and MRA head  09/28/2016 1. Acute infarctions are present within the right mid caudate body, right posterior lentiform nucleus, intermittently throughout cortex of right insula and temporal operculum, and there are 2 punctate foci in the right parietal cortex. 2. No hemorrhage identified. 3. Background of mild chronic microvascular ischemic changes and parenchymal volume loss of the brain. 4. Widely patent right M1 segment and proximal right M2 segments. There is a long segment of irregularity of the right M2 inferior division in the sylvian fissure with intermittent mild stenosis. 5. 2 mm laterally directed outpouching at the origin of a posterior temporal M3 branch which may represent an occluded branch vessel origin or tiny aneurysm. 6. Short segment of severe stenosis of the origin of a right parietal M3 branch. 7. Otherwise normal MRA of the head.   2-D echocardiogram - Left ventricle: The cavity size was moderately dilated. Systolicfunction was moderately reduced. The estimated ejection fractionwas in the range of 35% to 40%. Diffuse hypokinesis worse in theinferior and inferolateral walls. Doppler parameters areconsistent with abnormal left ventricular relaxation (grade 1diastolic dysfunction). Doppler parameters are consistent withindeterminate ventricular filling pressure. - Aortic valve: Transvalvular velocity was within the normal range.There was no stenosis. There was no regurgitation. - Mitral valve: Transvalvular velocity was within the normal range.There was no evidence for stenosis. There was trivialregurgitation. - Left atrium: The atrium was severely dilated. - Right ventricle: The cavity size was normal. Wall thickness wasnormal. Systolic function was normal. - Tricuspid valve: There was no regurgitation.  LE venous Doppler -  left popliteal acute vs. Time indeterminate DVT but not chronic.   TCD bubble study - negative for PFO     HISTORY OF PRESENT ILLNESS [name] an 61 y.o.malewith multiple risk factors for stroke including diabetes, smoking, hypertension and hyperlipidemia. Patient was last seen normal at 1045 today 09/27/2016 (LKW) when he attempted to use his left arm to lower the foot rest of his chair when he noticed that he could not move his arm or his leg. His friends also noticed that he had a left facial droop and dysarthria. Patient was brought to [facility] as a  code stroke. Symptoms persisted. CT of head was obtained showing a right dense MCA sign this was confirmed with CT angiogram of head and neck. Patient was quickly started with TPA and patient signed consent for interventional radiology. Patient was administered IV t-PA 09/27/2016 at 1239. Cerebral angiogram confirm right MCA occlusion. Initial mechanical thrombectomy path restored TICI 2b flow. 2nd pass to open insular cortical flow resulted in mobilization of the clock proximal would decrease load to TICI 1. Additional passes were successful in reestablishing TICI 2b flow. He was admitted to the neuro ICU for further evaluation and treatment.    HOSPITAL COURSE [name] is a 61 y.o. male with history of hypertension, diabetes, hyperlipidemia, PE, CAD s/p CABG x 4, and smoking presenting with left body hemiparesis. He received IV t-PA on 09/27/2016 at 1239. He was taken to IR where he received TICI 2b revascularization of R MCA occlusion with mechanical thrombectomy.  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 cardioembolic (low EF, ? afib)  Resultant  L hemiparesis  Code stroke CT hyperdense R MCA emergent LVO. Aspects = 10   CTA head and neck LVO  R MCA M1. High-grade L ICA stenosis 80 % at origin. Moderate L VA  origin stenosis  Cerebral anigo R MCA occlusion s/p mechanical thrombectomy with reestablished TICI 2b flow  repeat am repeat - R basal ganglia infarct. Residual R contrast staining. R inferior frontal lobe encephalomalacia. Moderate to severe atherosclerosis   MRI right MCA patchy infarct   MRA unremarkable after mechanical thrombectomy  Lower extremity Doppler  Left popliteal vein acute / age undetermined DVT  TCD bubble study - no PFO  2D Echo  EF 35-40%, Diffuse hypokinesis  TEE and loop cancelled due to pt already on long term anticoagulation and TEE/loop result will not change management.  LDL 58  HgbA1c 5.7  aspirin 81 mg daily prior to admission, now on Eliquis for DVT treatment. Will continue eliquis life long due to recurrent DVT/PE. Continue ASA 81mg  for cardiac prevention.  Ongoing aggressive stroke risk factor management  Therapy recommendations:  none  Disposition:   return home  Left carotid artery stenosis  Asymptomatic  Left ICA/CCA > 80% stenosis on CTA   Bilateral ICA siphons atherosclerosis  Outpatient follow-up with [facility]  Recurrent PE/DVT  Pt had hx of PE and was on anticoagulation for short time  LE venous doppler showed left popliteal vein DVT  TCD bubble study no PFO  Pt is on eliquis for DVT treatment  Long term anticoagulation indicated for recurrent unprovoked DVT/PE  CAD / MI, s/p CABG, stent  left ventricular dysfunction, EF 35-40%  Bradycardia with frequent PVCs  Avoid beta blocker  Continue ASA on top of eliquis  Hypertension  Home meds - metoprolol  Now on no BP meds  BP stable  Avoid beta blocker due to bradycardia  Long-term BP goal normotensive  Hyperlipidemia  Home meds:  Zocor 40, resumed in hospital  LDL 58, goal < 70  Continue statin at discharge  Diabetes  HgbA1c 5.7, goal < 7.0  Tobacco abuse  Current smoker  Smoking  cessation counseling provided  Pt is willing to quit  Other Stroke Risk Factors  UDS positive for THC, couselled for quit  Other Active Problems  Hx gastric bypass   DISCHARGE EXAM Blood pressure (!) 107/46,  pulse (!) 56, temperature 97.9 F (36.6 C), temperature source Oral, resp. rate 16, height 5\' 9"  (1.753 m), weight 80.8 kg (178 lb 2.1 oz), SpO2 100 %. General - Thin, well developed, not in acute distress.  Ophthalmologic - Fundi not visualized due to noncooperation.  Cardiovascular - Regular rate and rhythm.  Mental Status -  Level of arousal and orientation to time, place, and person were intact. Language including expression, naming, repetition, comprehension was assessed and found intact. Attention span and concentration were normal. Fund of Knowledge was assessed and was intact.  Cranial Nerves II - XII - II - Visual field intact OU. III, IV, VI - Extraocular movements intact. V - Facial sensation intact bilaterally. VII - Facial movement intact bilaterally. VIII - Hearing & vestibular intact bilaterally. X - Palate elevates symmetrically. XI - Chin turning & shoulder shrug intact bilaterally. XII - Tongue protrusion intact.  Motor Strength - The patient's strength was normal in all extremities and pronator drift was absent.  Bulk was normal and fasciculations were absent.   Motor Tone - Muscle tone was assessed at the neck and appendages and was normal.  Reflexes - The patient's reflexes were 1+ in all extremities and he had no pathological reflexes.  Sensory - Light touch, temperature/pinprick were assessed and were symmetrical.    Coordination - The patient had normal movements in the hands and feet with no ataxia or dysmetria.  Tremor was absent.  Gait and Station - The patient's transfers, posture, gait, station, and turns were observed as normal.  Discharge Diet   Diet Heart Room service appropriate? Yes; Fluid consistency: Thin  liquids  DISCHARGE PLAN  Disposition:  home  Eliquis (apixaban) daily for secondary stroke prevention given recurrent DVT as well as aspirin 81 mg given cardiac disease  Ongoing risk factor control by Primary Care Physician at time of discharge  Follow-up Dennis Norris, MD in 2 weeks.  Follow-up with [facility] for asymptomatic L carotid stenosis. Referral called in. Office closed message left. Office to call pt with appt date and time.  Follow-up with Dr. Corrie Mckusick, neuro interventional list. IR clinic scheduler will call with appointment date and time  Follow-up with Dr. Rosalin Patterson, Stroke Clinic in 6 weeks, office to schedule an appointment.  40 minutes were spent preparing discharge.  Dennis Hawking, MD PhD Stroke Neurology 10/01/2016 1:33 AM

## 2016-09-30 NOTE — Care Management Important Message (Signed)
Important Message  Patient Details  Name: [name] MRN: [id number] Date of Birth: Jul 20, 1956   Medicare Important Message Given:  Yes    Venora Kautzman 09/30/2016, 1:50 PM

## 2016-09-30 NOTE — Care Management Note (Signed)
Case Management Note  Patient Details  Name: [name] MRN: [id number] Date of Birth: [birth year]  Subjective/Objective:                    Action/Plan: Pt discharging home with self care. No f/u needs per PT/OT and no DME needs.  Pt started on Eliquis. CM did an benefits check that revealed a cost of $124.64 a month d/t not having met his deductible. His cost after meeting his deductible is $42/ month. Pt informed and will f/u with his pharmacy and PCP as needed. Pt provided the 30 day free card.  Pt has transportation home and his brother is going to be staying with him over next several days.   Expected Discharge Date:  09/30/16               Expected Discharge Plan:  [place]  In-House Referral:     Discharge planning Services  CM Consult  Post Acute Care Choice:    Choice offered to:     DME Arranged:    DME Agency:     HH Arranged:    HH Agency:     Status of Service:  Completed, signed off  If discussed at [employer], dates discussed:    Additional Comments:  Pollie Friar, RN 09/30/2016, 3:31 PM

## 2016-09-30 NOTE — Progress Notes (Signed)
Pt being discharged from hospital per orders from MD. Pt educated on discharge instructions. Pt verbalized understanding of instructions. All questions and concerns were addressed. Pt's IV was removed prior to discharge. Pt exited hospital via ambulation. 

## 2016-10-03 ENCOUNTER — Telehealth: Payer: Self-pay | Admitting: *Deleted

## 2016-10-03 NOTE — Telephone Encounter (Signed)
Unable to reach patient at time of TCM Call. Left message for patient to return call when available.  

## 2016-10-03 NOTE — Telephone Encounter (Signed)
Transition Care Management Follow-up Telephone Call  Per Discharge Summary: Date of Admission: 09/27/2016 Date of Discharge: 09/30/2016  Attending Physician:  Rosalin Hawking, MD, Stroke MD Consultant(s):   Dr. Merrie Roof, pulmonary/intensive care  Patient's PCP:  Arnette Norris, MD  DISCHARGE DIAGNOSIS:  Principal Problem:   Cerebrovascular accident (CVA) due to embolism of right middle cerebral artery ([place]) - s/ IV tPA and mechanical thrombectomy   DVT, left LE Active Problems:   Diabetes mellitus type 2, diet-controlled ([place])   HLD (hyperlipidemia)   TOBACCO ABUSE   Essential hypertension   MYOCARDIAL INFARCTION, HX OF   CAD, ARTERY BYPASS GRAFT   PULMONARY EMBOLISM, HX OF   CVA (cerebral vascular accident) ([place])   Acute respiratory failure ([place])   Left carotid stenosis   Recurrent pulmonary emboli ([place])   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 "I'm doing real good."   Do you understand why you were in the hospital? yes   Do you understand the discharge instructions? yes   Where were you discharged to? Home   Items Reviewed:  Medications reviewed: yes  Allergies reviewed: yes  Dietary changes reviewed: yes  Referrals reviewed: yes, neurology referral placed   Functional Questionnaire:   Activities of Daily Living (ADLs):   He states they are independent in the following: ambulation, bathing and hygiene, feeding, continence, grooming, toileting and dressing States they require assistance with the following: none   Any transportation issues/concerns?: no   Any patient concerns? Yes, patient is concerned about cost of Eliquis. He was able to get a discount card and got current rx for free, but he is not sure if he has met his insurance deductible yet and may have to pay  out of pocket for future prescriptions. He plans to cal [facility] to clarify if he has met deductible.   Confirmed importance and date/time of follow-up visits scheduled yes  Provider Appointment booked with Dr. Arnette Norris 10/05/16 @ 11:00am  Confirmed with patient if condition begins to worsen call PCP or go to the ER.  Patient was given the office number and encouraged to call back with question or concerns.  : yes

## 2016-10-05 ENCOUNTER — Encounter: Payer: Self-pay | Admitting: Family Medicine

## 2016-10-05 ENCOUNTER — Ambulatory Visit (INDEPENDENT_AMBULATORY_CARE_PROVIDER_SITE_OTHER): Payer: Medicare HMO | Admitting: Family Medicine

## 2016-10-05 ENCOUNTER — Other Ambulatory Visit: Payer: Self-pay

## 2016-10-05 VITALS — BP 110/62 | HR 66 | Temp 97.9°F | Ht 69.0 in | Wt 184.0 lb

## 2016-10-05 DIAGNOSIS — J9600 Acute respiratory failure, unspecified whether with hypoxia or hypercapnia: Secondary | ICD-10-CM | POA: Diagnosis not present

## 2016-10-05 DIAGNOSIS — I63412 Cerebral infarction due to embolism of left middle cerebral artery: Secondary | ICD-10-CM

## 2016-10-05 DIAGNOSIS — I2510 Atherosclerotic heart disease of native coronary artery without angina pectoris: Secondary | ICD-10-CM

## 2016-10-05 DIAGNOSIS — I82402 Acute embolism and thrombosis of unspecified deep veins of left lower extremity: Secondary | ICD-10-CM

## 2016-10-05 DIAGNOSIS — I2782 Chronic pulmonary embolism: Secondary | ICD-10-CM | POA: Diagnosis not present

## 2016-10-05 DIAGNOSIS — I82509 Chronic embolism and thrombosis of unspecified deep veins of unspecified lower extremity: Secondary | ICD-10-CM | POA: Diagnosis not present

## 2016-10-05 DIAGNOSIS — Z8673 Personal history of transient ischemic attack (TIA), and cerebral infarction without residual deficits: Secondary | ICD-10-CM | POA: Diagnosis not present

## 2016-10-05 DIAGNOSIS — I2699 Other pulmonary embolism without acute cor pulmonale: Secondary | ICD-10-CM

## 2016-10-05 MED ORDER — SIMVASTATIN 80 MG PO TABS: 80.0000 mg | ORAL_TABLET | Freq: Every day | ORAL | Status: DC

## 2016-10-05 MED ORDER — SIMVASTATIN 80 MG PO TABS: 80.0000 mg | ORAL_TABLET | Freq: Every day | ORAL | 1 refills | Status: DC

## 2016-10-05 MED ORDER — ALPRAZOLAM 1 MG PO TABS: ORAL_TABLET | ORAL | 0 refills | Status: DC

## 2016-10-05 MED ORDER — HYDROCODONE-ACETAMINOPHEN 10-325 MG PO TABS: 1.0000 | ORAL_TABLET | ORAL | 0 refills | Status: DC | PRN

## 2016-10-05 NOTE — Assessment & Plan Note (Signed)
On Eliquis

## 2016-10-05 NOTE — Progress Notes (Signed)
Subjective:   Patient ID: [name], male    DOB: 1956/02/15, 61 y.o.   MRN: [id number]  [name] is a pleasant 61 y.o. year old male who presents to clinic today with Hospitalization Follow-up (stroke, DVT back of left leg. was taken off of metoprolol was put on ELIQUIS, cant' take prilosec anymore. )  on 10/05/2016  HPI:  Presented to ER on 09/27/16 after he attempted to use his left arm and leg and noticed he could not move them and he had a facial droop and dysarthria. Notes reviewed.  CT of head showed right dense MCA sign, confirmed on CT angiogram of head and neck. tPA was started, cath.\, thrombectomy. He was then admitted to neuro ICU for evaluation and treatment.  Left ICA/CCA > 80% stenosis on CTA.  Dopplers of LE showed left popliteal DVT. TCD bubble study neg for PFO.  Echo showed EF of 35- 40% Pt has h/o CAD s/p CABG, stent. Beta blocker d/c'd due to bradycardia. Advised to continue statin.  Has no residual deficits. Ct Angio Head W Or Wo Contrast  Result Date: 09/27/2016 CLINICAL DATA:  61 year old male code stroke with suspicion of right MCA occlusion on noncontrast head CT. Initial encounter. EXAM: CT ANGIOGRAPHY HEAD AND NECK TECHNIQUE: Multidetector CT imaging of the head and neck was performed using the standard protocol during bolus administration of intravenous contrast. Multiplanar CT image reconstructions and MIPs were obtained to evaluate the vascular anatomy. Carotid stenosis measurements (when applicable) are obtained utilizing NASCET criteria, using the distal internal carotid diameter as the denominator. CONTRAST:  50 mL Isovue 370 COMPARISON:  Noncontrast head CT 1214 hours today. FINDINGS: CTA NECK Skeleton: Absent maxillary dentition. Lower cervical ACDF hardware. Previous median sternotomy. No acute osseous abnormality identified. Upper chest: Centrilobular and paraseptal emphysema. Previous CABG. No superior mediastinal lymphadenopathy. Other  neck: Negative thyroid, larynx, pharynx, parapharyngeal spaces, retropharyngeal space, sublingual space, submandibular glands and parotid glands. No cervical lymphadenopathy. Aortic arch: 3 vessel arch configuration. Mild to moderate mostly distal arch calcified atherosclerosis. Right carotid system: Negative brachiocephalic artery and right CCA origin. No right CCA stenosis. Calcified plaque at the right carotid bifurcation including in the right ICA origin and bulb, but no significant stenosis. Mildly tortuous cervical right ICA. Left carotid system: Negative left CCA origin. Calcified plaque along the anterior left CCA without stenosis proximal to the bifurcation. Bulky calcified plaque at the left carotid bifurcation resulting in high-grade left ICA origin stenosis (numerically estimated at 80 % with respect to the distal vessel) and approaching a RADIOGRAPHIC STRING SIGN. See series 5, image 120 and series 8, image 114. Calcified plaque continues in the left ICA bulb without additional stenosis. Vertebral arteries: No proximal right subclavian artery or right vertebral artery origins stenosis. Negative cervical right vertebral artery. Calcified plaque in the proximal left subclavian artery without stenosis. Bulky calcified plaque at the left vertebral artery origin resulting in mild to moderate origin stenosis (series 8, image 144). The left vertebral artery is mildly dominant, with no additional stenosis to the skullbase. CTA HEAD Posterior circulation: Dominant distal left vertebral artery primarily supplies the basilar. The non dominant distal right vertebral artery is diminutive beyond the right PICA origin. There is mild left V4 segment calcified plaque without distal left vertebral artery stenosis. Mild irregularity at the vertebrobasilar junction does not appear hemodynamically significant. Mildly tortuous basilar artery without stenosis. SCA and PCA origins are patent. Posterior communicating arteries  are diminutive or absent. Bilateral PCA branches  appear within normal limits. Anterior circulation: Both ICA siphons are patent. There is fairly extensive bilateral siphon calcified plaque, but this does not appear to be hemodynamically significant. Both carotid termini are patent. MCA and ACA origins are normal. Diminutive or absent anterior communicating artery. Bilateral ACA branches, left MCA M1 segment, and left MCA branches appear within normal limits. The right MCA M1 segment is occluded 9-10 mm beyond its origin. See series 11, image 22 and series 9, image 18. While there is no enhancement at the right MCA bifurcationthere is some reconstitution of the right MCA branches as seen on series 9, image 16. Venous sinuses: Appear patent. Anatomic variants: Dominant left vertebral artery. Review of the MIP images confirms the above findings IMPRESSION: 1. 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 This was relayed via text pager to [facility] on 09/27/2016 at 1230 hours, and later we discussed by telephone. 2. Superimposed right carotid calcified atherosclerosis without hemodynamically significant stenosis. 3. However, there is High-grade (80%, approaching RADIOGRAPHIC STRING SIGN) stenosis at the left ICA origin. 4. Dominant left vertebral artery with up to moderate origin stenosis due to calcified plaque. Non dominant right vertebral artery functionally terminates in PICA. Electronically Signed   By: Genevie Ann M.D.   On: 09/27/2016 12:46   Ct Head Wo Contrast  Result Date: 09/28/2016 CLINICAL DATA:  Status post tPA administration EXAM: CT HEAD WITHOUT CONTRAST TECHNIQUE: Contiguous axial images were obtained from the base of the skull through the vertex without intravenous contrast. COMPARISON:  Head CT 09/28/2016 at 5:12 a.m. and 09/27/2016 FINDINGS: Brain: Area of focal hypoattenuation at the posterior right basal ganglia is again seen, consistent with acute infarct. The nearby  date area of contrast staining shows decreased density, as expected. There is no acute hemorrhage. No new evidence of ischemia. No midline shift or mass effect. Vascular: Atherosclerotic calcification of the internal carotid arteries at the skull base. Skull: Incomplete fusion of the posterior C1 ring. Sinuses/Orbits: Atelectatic right maxillary sinus with mild mucosal thickening. Normal orbits Other: None. IMPRESSION: 1. Unchanged appearance of posterior right basal ganglia acute infarct. No acute hemorrhage. 2. Expected decreased density of contrast staining. Electronically Signed   By: Ulyses Jarred M.D.   On: 09/28/2016 15:32   Ct Head Wo Contrast  Result Date: 09/28/2016 CLINICAL DATA:  Followup stroke. EXAM: CT HEAD WITHOUT CONTRAST TECHNIQUE: Contiguous axial images were obtained from the base of the skull through the vertex without intravenous contrast. COMPARISON:  CT HEAD September 27, 2016 FINDINGS: BRAIN: Residual contrast staining RIGHT insula with density in the RIGHT sylvian fissure. Small infarct RIGHT posterior putaminal RIGHT inferior frontal lobe probable encephalomalacia. No definite intraparenchymal hemorrhage. No midline shift or mass effect. Ventricles are normal for patient's age. Basal cisterns are patent. VASCULAR: Moderate to severe calcific atherosclerosis of the carotid siphons. SKULL: No skull fracture. No significant scalp soft tissue swelling. SINUSES/ORBITS: RIGHT paranasal sinus mucosal thickening with atresia compatible with chronic sinusitis. Trace LEFT mastoid effusion. The included ocular globes and orbital contents are non-suspicious. OTHER: Poor dentition. IMPRESSION: Acute RIGHT basal ganglia nonhemorrhagic infarct. Residual RIGHT contrast staining. RIGHT inferior frontal lobe encephalomalacia. Moderate to severe atherosclerosis. Electronically Signed   By: Elon Alas M.D.   On: 09/28/2016 05:32   Ct Head Wo Contrast  Result Date: 09/27/2016 CLINICAL DATA:   61 year old male with ELVO, right MCA occlusion status post endovascular reperfusion. Initial encounter. EXAM: CT HEAD WITHOUT CONTRAST TECHNIQUE: Contiguous axial images were obtained  from the base of the skull through the vertex without intravenous contrast. COMPARISON:  Endovascular images earlier today. CTA head and neck 1227 hours today, and earlier. FINDINGS: Brain: Parenchymal staining in the posterior right temporal lobe, and some of the more posterior right MCA territory including the inferior right parietal lobe. No associated mass effect. No acute intracranial hemorrhage identified. No superimposed right MCA cytotoxic edema identified; chronic right inferior frontal gyrus encephalomalacia. No ventriculomegaly or intracranial mass effect. Vascular: Calcified atherosclerosis at the skull base. Residual intravascular contrast. Skull: No acute osseous abnormality identified. Sinuses/Orbits: Intubated. Stable mild right maxillary sinus mucosal thickening. Other: No acute orbit or scalp soft tissue findings. IMPRESSION: 1. Post endovascular Neuro-intervention parenchymal staining in the right temporal and parietal lobes. No superimposed cytotoxic edema, acute intracranial hemorrhage or mass effect identified. 2. Small area of chronic encephalomalacia in the right anterior insula / inferior frontal gyrus. Electronically Signed   By: Genevie Ann M.D.   On: 09/27/2016 16:39   Ct Angio Neck W Or Wo Contrast  Result Date: 09/27/2016 CLINICAL DATA:  61 year old male code stroke with suspicion of right MCA occlusion on noncontrast head CT. Initial encounter. EXAM: CT ANGIOGRAPHY HEAD AND NECK TECHNIQUE: Multidetector CT imaging of the head and neck was performed using the standard protocol during bolus administration of intravenous contrast. Multiplanar CT image reconstructions and MIPs were obtained to evaluate the vascular anatomy. Carotid stenosis measurements (when applicable) are obtained utilizing NASCET  criteria, using the distal internal carotid diameter as the denominator. CONTRAST:  50 mL Isovue 370 COMPARISON:  Noncontrast head CT 1214 hours today. FINDINGS: CTA NECK Skeleton: Absent maxillary dentition. Lower cervical ACDF hardware. Previous median sternotomy. No acute osseous abnormality identified. Upper chest: Centrilobular and paraseptal emphysema. Previous CABG. No superior mediastinal lymphadenopathy. Other neck: Negative thyroid, larynx, pharynx, parapharyngeal spaces, retropharyngeal space, sublingual space, submandibular glands and parotid glands. No cervical lymphadenopathy. Aortic arch: 3 vessel arch configuration. Mild to moderate mostly distal arch calcified atherosclerosis. Right carotid system: Negative brachiocephalic artery and right CCA origin. No right CCA stenosis. Calcified plaque at the right carotid bifurcation including in the right ICA origin and bulb, but no significant stenosis. Mildly tortuous cervical right ICA. Left carotid system: Negative left CCA origin. Calcified plaque along the anterior left CCA without stenosis proximal to the bifurcation. Bulky calcified plaque at the left carotid bifurcation resulting in high-grade left ICA origin stenosis (numerically estimated at 80 % with respect to the distal vessel) and approaching a RADIOGRAPHIC STRING SIGN. See series 5, image 120 and series 8, image 114. Calcified plaque continues in the left ICA bulb without additional stenosis. Vertebral arteries: No proximal right subclavian artery or right vertebral artery origins stenosis. Negative cervical right vertebral artery. Calcified plaque in the proximal left subclavian artery without stenosis. Bulky calcified plaque at the left vertebral artery origin resulting in mild to moderate origin stenosis (series 8, image 144). The left vertebral artery is mildly dominant, with no additional stenosis to the skullbase. CTA HEAD Posterior circulation: Dominant distal left vertebral artery  primarily supplies the basilar. The non dominant distal right vertebral artery is diminutive beyond the right PICA origin. There is mild left V4 segment calcified plaque without distal left vertebral artery stenosis. Mild irregularity at the vertebrobasilar junction does not appear hemodynamically significant. Mildly tortuous basilar artery without stenosis. SCA and PCA origins are patent. Posterior communicating arteries are diminutive or absent. Bilateral PCA branches appear within normal limits. Anterior circulation: Both ICA siphons are patent. There is  fairly extensive bilateral siphon calcified plaque, but this does not appear to be hemodynamically significant. Both carotid termini are patent. MCA and ACA origins are normal. Diminutive or absent anterior communicating artery. Bilateral ACA branches, left MCA M1 segment, and left MCA branches appear within normal limits. The right MCA M1 segment is occluded 9-10 mm beyond its origin. See series 11, image 22 and series 9, image 18. While there is no enhancement at the right MCA bifurcationthere is some reconstitution of the right MCA branches as seen on series 9, image 16. Venous sinuses: Appear patent. Anatomic variants: Dominant left vertebral artery. Review of the MIP images confirms the above findings IMPRESSION: 1. 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 This was relayed via text pager to [facility] on 09/27/2016 at 1230 hours, and later we discussed by telephone. 2. Superimposed right carotid calcified atherosclerosis without hemodynamically significant stenosis. 3. However, there is High-grade (80%, approaching RADIOGRAPHIC STRING SIGN) stenosis at the left ICA origin. 4. Dominant left vertebral artery with up to moderate origin stenosis due to calcified plaque. Non dominant right vertebral artery functionally terminates in PICA. Electronically Signed   By: Genevie Ann M.D.   On: 09/27/2016 12:46   Mr Jodene Nam Head Wo  Contrast  Result Date: 09/28/2016 CLINICAL DATA:  60 y/o M; left-sided weakness and slurred speech. Right MCA occlusion status post endovascular repair fusion. EXAM: MRI HEAD WITHOUT CONTRAST MRA HEAD WITHOUT CONTRAST TECHNIQUE: Multiplanar, multiecho pulse sequences of the brain and surrounding structures were obtained without intravenous contrast. Angiographic images of the head were obtained using MRA technique without contrast. COMPARISON:  09/28/2016 CT head. 09/27/2016 CT angiogram. 09/27/2016 CT angiogram head. FINDINGS: MRI HEAD FINDINGS Brain: Foci of diffusion restriction are present within the right mid caudate body, right posterior lentiform nucleus, intermittently throughout the cortex of right insula and temporal operculum, and 2 punctate foci in the right parietal cortex compatible with acute infarction. Acute infarctions are associated with T2 hyperintense signal abnormality and minimal local mass effect. There is no susceptibility hypointensity to suggest hemorrhage. There is a background of mild chronic microvascular ischemic changes and parenchymal volume loss of the brain. No hydrocephalus or extra-axial collection. No significant mass effect of the brain. Vascular: Normal flow voids. Skull and upper cervical spine: Normal marrow signal. Sinuses/Orbits: Mild diffuse paranasal sinus disease greatest in the right maxillary sinus. Bilateral mastoid effusions. Orbits are unremarkable. Other: None. MRA HEAD FINDINGS Internal carotid arteries:  Patent. Anterior cerebral arteries:  Patent. Middle cerebral arteries: Patent left. Widely patent right M1 segment and proximal M2 segments. There is irregularity of the right M2 inferior division in the sylvian fissure with intermittent mild stenosis (series 6 image 92- 101) and there is a 2 mm laterally directed outpouching at the origin of a posterior temporal M3 branch (series 6, image 82) which may represent an occluded branch vessel origin or tiny  aneurysm. Short segment of severe stenosis of the origin of a right parietal M3 branch (series 6, image 89). Anterior communicating artery: Patent. Posterior communicating arteries: Patent right. No left identified. Posterior cerebral arteries:  Patent. Basilar artery:  Patent. Vertebral arteries: Patent. Fenestrated right vertebral artery just downstream to PICA origin. IMPRESSION: 1. Acute infarctions are present within the right mid caudate body, right posterior lentiform nucleus, intermittently throughout cortex of right insula and temporal operculum, and there are 2 punctate foci in the right parietal cortex. 2. No hemorrhage identified. 3. Background of mild chronic microvascular ischemic changes  and parenchymal volume loss of the brain. 4. Widely patent right M1 segment and proximal right M2 segments. There is a long segment of irregularity of the right M2 inferior division in the sylvian fissure with intermittent mild stenosis. 5. 2 mm laterally directed outpouching at the origin of a posterior temporal M3 branch which may represent an occluded branch vessel origin or tiny aneurysm. 6. Short segment of severe stenosis of the origin of a right parietal M3 branch. 7. Otherwise normal MRA of the head. Electronically Signed   By: Kristine Garbe M.D.   On: 09/28/2016 23:00   Mr Brain Wo Contrast  Result Date: 09/28/2016 CLINICAL DATA:  61 y/o M; left-sided weakness and slurred speech. Right MCA occlusion status post endovascular repair fusion. EXAM: MRI HEAD WITHOUT CONTRAST MRA HEAD WITHOUT CONTRAST TECHNIQUE: Multiplanar, multiecho pulse sequences of the brain and surrounding structures were obtained without intravenous contrast. Angiographic images of the head were obtained using MRA technique without contrast. COMPARISON:  09/28/2016 CT head. 09/27/2016 CT angiogram. 09/27/2016 CT angiogram head. FINDINGS: MRI HEAD FINDINGS Brain: Foci of diffusion restriction are present within the right mid  caudate body, right posterior lentiform nucleus, intermittently throughout the cortex of right insula and temporal operculum, and 2 punctate foci in the right parietal cortex compatible with acute infarction. Acute infarctions are associated with T2 hyperintense signal abnormality and minimal local mass effect. There is no susceptibility hypointensity to suggest hemorrhage. There is a background of mild chronic microvascular ischemic changes and parenchymal volume loss of the brain. No hydrocephalus or extra-axial collection. No significant mass effect of the brain. Vascular: Normal flow voids. Skull and upper cervical spine: Normal marrow signal. Sinuses/Orbits: Mild diffuse paranasal sinus disease greatest in the right maxillary sinus. Bilateral mastoid effusions. Orbits are unremarkable. Other: None. MRA HEAD FINDINGS Internal carotid arteries:  Patent. Anterior cerebral arteries:  Patent. Middle cerebral arteries: Patent left. Widely patent right M1 segment and proximal M2 segments. There is irregularity of the right M2 inferior division in the sylvian fissure with intermittent mild stenosis (series 6 image 92- 101) and there is a 2 mm laterally directed outpouching at the origin of a posterior temporal M3 branch (series 6, image 82) which may represent an occluded branch vessel origin or tiny aneurysm. Short segment of severe stenosis of the origin of a right parietal M3 branch (series 6, image 89). Anterior communicating artery: Patent. Posterior communicating arteries: Patent right. No left identified. Posterior cerebral arteries:  Patent. Basilar artery:  Patent. Vertebral arteries: Patent. Fenestrated right vertebral artery just downstream to PICA origin. IMPRESSION: 1. Acute infarctions are present within the right mid caudate body, right posterior lentiform nucleus, intermittently throughout cortex of right insula and temporal operculum, and there are 2 punctate foci in the right parietal cortex. 2. No  hemorrhage identified. 3. Background of mild chronic microvascular ischemic changes and parenchymal volume loss of the brain. 4. Widely patent right M1 segment and proximal right M2 segments. There is a long segment of irregularity of the right M2 inferior division in the sylvian fissure with intermittent mild stenosis. 5. 2 mm laterally directed outpouching at the origin of a posterior temporal M3 branch which may represent an occluded branch vessel origin or tiny aneurysm. 6. Short segment of severe stenosis of the origin of a right parietal M3 branch. 7. Otherwise normal MRA of the head. Electronically Signed   By: Kristine Garbe M.D.   On: 09/28/2016 23:00   Dg Chest Port 1 View  Result Date:  09/28/2016 CLINICAL DATA:  Respiratory failure. EXAM: PORTABLE CHEST 1 VIEW COMPARISON:  09/27/2016 . FINDINGS: Endotracheal tube in stable position. Prior CABG. Heart size stable. No focal infiltrate. No pleural effusion or pneumothorax. Prior cervical spine fusion. IMPRESSION: 1. Endotracheal tube in stable position. 2. Prior CABG. Heart size stable. No acute cardiopulmonary disease identified . Chest is stable from prior exam. Electronically Signed   By: Marcello Moores  Register   On: 09/28/2016 07:53   Dg Chest Port 1 View  Result Date: 09/27/2016 CLINICAL DATA:  Endotracheal tube placement.  Initial encounter. EXAM: PORTABLE CHEST 1 VIEW COMPARISON:  Chest radiograph performed 09/02/2013 FINDINGS: The patient's endotracheal tube is seen ending 5 cm above the carina. The lungs are well-aerated and clear. There is no evidence of focal opacification, pleural effusion or pneumothorax. The cardiomediastinal silhouette is normal in size. The patient is status post median sternotomy. No acute osseous abnormalities are seen. IMPRESSION: 1. Endotracheal tube seen ending 5 cm above the carina. 2. No acute cardiopulmonary process seen. Electronically Signed   By: Garald Balding M.D.   On: 09/27/2016 18:32   Ir  Percutaneous Art Thrombectomy/infusion Intracranial Inc Diag Angio  Result Date: 09/27/2016 INDICATION: 61 year old male presents with emergent large vessel occlusion of the right MCA. Given his high NIH stroke scale, baseline function, and within the window for treatment, he would like to proceed with cerebral angiogram and mechanical thrombectomy. EXAM: IR PERCUTANEOUS ART THORMBECTOMY/INFUSION INTRACRANIAL INCLUDE DIAG ANGIO; IR ANGIO INTRA EXTRACRAN SEL COM CAROTID INNOMINATE UNI LEFT MOD SED COMPARISON:  Noncontrast head CT 09/27/2016, cerebral CT angiogram 09/27/2016 MEDICATIONS: 2.0 g Ancef. The antibiotic was administered within 1 hour of the procedure ANESTHESIA/SEDATION: General endotracheal anesthesia CONTRAST:  190 cc Isovue FLUOROSCOPY TIME:  Fluoroscopy Time: 86 minutes 0 seconds (4316 mGy). COMPLICATIONS: SIR Level A - No therapy, no consequence. TECHNIQUE: Two physicians were involved with this case. Case was initiated with Dr. Estanislado Pandy, and was completed by Dr. Earleen Newport. Both physicians were present for the informed written consent, which was obtained from the patient after a thorough discussion of the procedural risks, benefits and alternatives. Specific risks discussed include: Bleeding, infection, contrast reaction, kidney injury/failure, need for further procedure/surgery, arterial injury or dissection, embolization to new territory, intracranial hemorrhage (10-15% risk), neurologic deterioration, cardiopulmonary collapse, death. All questions were addressed. Maximal Sterile Barrier Technique was utilized including during the procedure including caps, mask, sterile gowns, sterile gloves, sterile drape, hand hygiene and skin antiseptic. A timeout was performed prior to the initiation of the procedure. Dr. Estanislado Pandy initiated the procedure. Eleven blade was used to make a small incision at the skin site in the right inguinal region. A micropuncture needle was used access the right common femoral  artery. With excellent arterial blood flow returned, an .018 micro wire was passed through the needle, observed to enter the abdominal aorta under fluoroscopy. The needle was removed, and a micropuncture sheath was placed over the wire. The inner dilator and wire were removed, and an 035 wire was advanced under fluoroscopy into the abdominal aorta. The sheath was removed and a standard 5 [place] vascular sheath was placed. The dilator was removed and the sheath was flushed. A 89F JB-1 diagnostic catheter was advanced over the wire to the proximal descending thoracic aorta. Wire was then removed. Double flush of the catheter was performed. Catheter was then used to select the innominate artery. Angiogram was performed. Using roadmap technique, the catheter was advanced over a roadrunner wire into right common carotid artery. Formal angiogram  was performed. Exchange length Rosen wire was then passed through the diagnostic catheter to the distal common carotid artery and the diagnostic catheter was removed. The 5 French sheath was removed and exchanged for 8 French 55 centimeter BrightTip sheath. Sheath was flushed and attached to pressurized and heparinized saline bag for constant forward flow. Then an 8 [place], 85 cm Flowgate balloon tip catheter was prepared on the back table with inflation of the balloon with 50/50 concentration of dilute contrast. The balloon catheter was then advanced over the wire, positioned into the distal common carotid artery. Copious back flush was performed and the balloon catheter was attached to heparinized and pressurized saline bag for forward flow. Flowgate balloon catheter was then advanced over the road runner wire into the distal internal carotid artery. Angiogram was performed for road map guide. Microcatheter system was then introduced through an intermediate catheter, Ace 64, and the balloon guide catheter, using a synchro soft 014 wire and a Trevo Provue18 catheter. Microcatheter  and intermediate catheter system was advanced into the internal carotid artery, to the level of the occlusion. The micro wire was then carefully advanced through the occluded segment. Microcatheter was then push through the occluded segment and the wire was removed. Blood was then aspirated through the hub of the microcatheter, and a gentle contrast injection was performed confirming intraluminal position. A rotating hemostatic valve was then attached to the back end of the microcatheter, and a pressurized and heparinized saline bag was attached to the catheter. 4 x 40 solitaire device was then selected. Back flush was achieved at the rotating hemostatic valve, and then the device was gently advanced through the microcatheter to the distal end. The retriever was then unsheathed by withdrawing the microcatheter under fluoroscopy. Once the retriever was completely unsheathed, control angiogram was performed from the balloon catheter. The intermediate Ace 64 catheter was then advanced into the middle cerebral artery for local aspiration. Aspiration was then performed at the tip of the Ace catheter as the retriever was gently and slowly withdrawn with fluoroscopic observation. Once the retriever was entirely removed from the system, free aspiration was confirmed at the hub of the intermediate catheter, with free blood return confirmed. Control angiogram was performed. Given incomplete reperfusion, another attempt was initiated. Has the microcatheter system was advanced under road map fluoroscopic guidance into the right middle cerebral artery, it was recognized that there was prolapse of the system into the aortic arch. The entire balloon catheter, intermediate catheter, microcatheter system had prolapsed into the aortic arch and require removal. Dr Earleen Newport was then present for the remainder of the case. Once the system was double flushed, a JB 1 catheter was advanced into the aortic arch and used to select the common  carotid artery. Control angiogram was performed. The catheter was then used to select the common carotid artery with control run performed. The JB 1 catheter and roadrunner wire were then advanced into the proximal internal carotid artery, and after confirming location with a control angiogram, the wire was removed. Rosen wire was then placed into the internal carotid artery and the JB catheter was removed. Again, 8 coaxial system of microcatheter, intermediate Ace 64 catheter, and the balloon guide were advanced over the Rosen wire into the internal carotid artery. Wire was removed and once the system was aspirated and flushed with pressurized heparin bags attached, a control angiogram was performed. The microcatheter and micro wire were then advanced across the occlusion for a second thrombectomy attempt. The second attempt  was also local aspiration through the penumbra catheter and use of 4 x 40 solitaire stentreiver. Control angiogram demonstrated proximal embolization of the distal MCA with decreased TICI perfusion. Third attempt was performed using similar technique, local aspiration and solitaire 4 x 40 stentriever. In total 5 passes were made with local aspiration via Ace 64 catheter and 4 x 40 stentriever. The final and sixth pass was performed to re-establish flow through a dominant insular branch, using a 4 x 30 Trevo stentriever and a 64 aspiration. Trevo aspiration with proximal flow control (TRAP) technique was used on the final pass, with reestablished near complete, TICI 2b flow. Catheter and wires were removed from the right-sided cerebral circulation. JB 1 catheter was readvanced to the aortic arch, and angiogram of the left carotid system was performed including cerebral images. JB 1 catheter was removed. Control angiogram was performed at the right common femoral artery puncture site. The 8 French sheath was exchanged for a standard 9 French sheath at the common femoral artery puncture site.  Patient tolerated the procedure well and remained hemodynamically stable throughout. No complications were encountered and no significant blood loss encountered. FINDINGS: Initial cerebral angiogram Right common carotid artery: Normal course caliber and contour. Mild atherosclerotic changes at the right carotid bulb with no significant stenosis. Minimal tortuosity of the right ICA. Right external carotid artery: Patent with antegrade flow. Right internal carotid artery: Minimal tortuosity of the right internal carotid. Right MCA: Occlusion of the proximal right MCA, with no significant plaque present at the cavernous segment, ophthalmic segment, to the terminus. Ophthalmic artery patent. Posterior communicating artery patent. There is a temporal branch patent just proximal to the MCA occlusion. At the initiation of the case there is no significant collateral flow to the right MCA territory secondary to [facility] branches. Right ACA: A 1 segment patent. A 2 segment perfuses the right sided territory. No significant collateral filling of the right MCA territory upon initiation of the case. After the first pass, there is TICI 2b established. Persistent occlusion of a proximal insular branch with absence of MCA cortical branches. After the second pass, there was more proximal embolization of the clot, with decreased perfusion to the right MCA territory. TICI 1 Third - fifth passes were useful in reestablishing TICI 2a flow. The final and sixth pass targeting large proximal insular branch occlusion was successful in reestablishing TICI 2b flow. Left common carotid artery:  Normal course caliber and contour. Left external carotid artery: Patent with antegrade flow. Left internal carotid artery: Moderate atherosclerotic changes at the left carotid bulb with no significant narrowing on the lateral view and the anterior view demonstrating approximately 58%. No significant tortuosity of the left carotid system. Left MCA: M1 segment  patent. Insular and opercular segments patent. Unremarkable caliber and course of the cortical segments. Typical arterial, capillary/ parenchymal, and venous phase. Left ACA: A 1 segment patent. A 2 segment perfuses the left territory. IMPRESSION: Status post cerebral angiogram with mechanical thrombectomy of proximal right MCA occlusion and reestablished near-complete, TICI 2b flow to the right hemisphere. Left cerebral angiogram demonstrating adequate perfusion. Left cervical angiogram demonstrates moderate carotid bulb disease, measuring approximately 58% stenosis. Signed, Dulcy Fanny. Earleen Newport, DO [facility] Electronically Signed   By: Corrie Mckusick D.O.   On: 09/27/2016 17:56   Ct Head Code Stroke W/o Cm  Result Date: 09/27/2016 CLINICAL DATA:  Code stroke. 61 year old male with left side weakness and slurred speech. Initial encounter. EXAM: CT HEAD  WITHOUT CONTRAST TECHNIQUE: Contiguous axial images were obtained from the base of the skull through the vertex without intravenous contrast. COMPARISON:  Cervical spine MRI 03/30/2014 FINDINGS: Brain: No acute intracranial hemorrhage identified. No midline shift, mass effect, or evidence of intracranial mass lesion. 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 There is some associated right external capsule hypodensity. No superimposed acute cortically based infarct changes are identified. No other cortical encephalomalacia. Gray-white matter differentiation outside of the area above appears within normal limits. No ventriculomegaly. Vascular: Hyperdense right MCA (series 4, image 27). Skull: No acute osseous abnormality identified. Sinuses/Orbits: Mild mastoid effusion. Adenoid hypertrophy. Paranasal sinuses are clear except for right maxillary mucosal thickening. Other: Mild rightward gaze deviation.  Negative orbit and scalp soft tissues otherwise. ASPECTS [facility] Stroke Program Early CT Score) - Ganglionic level infarction (caudate, lentiform nuclei, internal capsule, insula, M1-M3 cortex): 7 - Supraganglionic infarction (M4-M6 cortex): 3 Total score (0-10 with 10 being normal): 10 IMPRESSION: 1. Positive for hyperdense right MCA suspicious for emergent large vessel occlusion. No acute intracranial hemorrhage or changes of acute cortically based infarct are identified. 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 2. ASPECTS is 10. 3. Study discussed by telephone with Dr. Lawana Pai on 09/27/2016 at 1225 hours. CTA is pending. Electronically Signed   By: Genevie Ann M.D.   On: 09/27/2016 12:28   Ir Angio Intra Extracran Sel Com Carotid Innominate Uni L Mod Sed  Result Date: 09/27/2016 INDICATION: 61 year old male presents with emergent large vessel occlusion of the right MCA. Given his high NIH stroke scale, baseline function, and within the window for treatment, he would like to proceed with cerebral angiogram and mechanical thrombectomy. EXAM: IR PERCUTANEOUS ART THORMBECTOMY/INFUSION INTRACRANIAL INCLUDE DIAG ANGIO; IR ANGIO INTRA EXTRACRAN SEL COM CAROTID INNOMINATE UNI LEFT MOD SED COMPARISON:  Noncontrast head CT 09/27/2016, cerebral CT angiogram 09/27/2016 MEDICATIONS: 2.0 g Ancef. The antibiotic was administered within 1 hour of the procedure ANESTHESIA/SEDATION: General endotracheal anesthesia CONTRAST:  190 cc Isovue FLUOROSCOPY TIME:  Fluoroscopy Time: 86 minutes 0 seconds (4316 mGy). COMPLICATIONS: SIR Level A - No therapy, no consequence. TECHNIQUE: Two physicians were involved with this case. Case was initiated with Dr. Estanislado Pandy, and was completed by Dr. Earleen Newport. Both physicians were present for the informed written consent, which was obtained from the patient after a thorough discussion of the procedural risks, benefits and alternatives. Specific  risks discussed include: Bleeding, infection, contrast reaction, kidney injury/failure, need for further procedure/surgery, arterial injury or dissection, embolization to new territory, intracranial hemorrhage (10-15% risk), neurologic deterioration, cardiopulmonary collapse, death. All questions were addressed. Maximal Sterile Barrier Technique was utilized including during the procedure including caps, mask, sterile gowns, sterile gloves, sterile drape, hand hygiene and skin antiseptic. A timeout was performed prior to the initiation of the procedure. Dr. Estanislado Pandy initiated the procedure. Eleven blade was used to make a small incision at the skin site in the right inguinal region. A micropuncture needle was used access the right common femoral artery. With excellent arterial blood flow returned, an .018 micro wire was passed through the needle, observed to enter the abdominal aorta under fluoroscopy. The needle was removed, and a micropuncture sheath was placed over the wire. The inner dilator and wire were removed, and an 035 wire was advanced under fluoroscopy into the abdominal aorta. The sheath was removed and a standard 5 [place] vascular sheath was  placed. The dilator was removed and the sheath was flushed. A 71F JB-1 diagnostic catheter was advanced over the wire to the proximal descending thoracic aorta. Wire was then removed. Double flush of the catheter was performed. Catheter was then used to select the innominate artery. Angiogram was performed. Using roadmap technique, the catheter was advanced over a roadrunner wire into right common carotid artery. Formal angiogram was performed. Exchange length Rosen wire was then passed through the diagnostic catheter to the distal common carotid artery and the diagnostic catheter was removed. The 5 French sheath was removed and exchanged for 8 French 55 centimeter BrightTip sheath. Sheath was flushed and attached to pressurized and heparinized saline bag for  constant forward flow. Then an 8 [place], 85 cm Flowgate balloon tip catheter was prepared on the back table with inflation of the balloon with 50/50 concentration of dilute contrast. The balloon catheter was then advanced over the wire, positioned into the distal common carotid artery. Copious back flush was performed and the balloon catheter was attached to heparinized and pressurized saline bag for forward flow. Flowgate balloon catheter was then advanced over the road runner wire into the distal internal carotid artery. Angiogram was performed for road map guide. Microcatheter system was then introduced through an intermediate catheter, Ace 64, and the balloon guide catheter, using a synchro soft 014 wire and a Trevo Provue18 catheter. Microcatheter and intermediate catheter system was advanced into the internal carotid artery, to the level of the occlusion. The micro wire was then carefully advanced through the occluded segment. Microcatheter was then push through the occluded segment and the wire was removed. Blood was then aspirated through the hub of the microcatheter, and a gentle contrast injection was performed confirming intraluminal position. A rotating hemostatic valve was then attached to the back end of the microcatheter, and a pressurized and heparinized saline bag was attached to the catheter. 4 x 40 solitaire device was then selected. Back flush was achieved at the rotating hemostatic valve, and then the device was gently advanced through the microcatheter to the distal end. The retriever was then unsheathed by withdrawing the microcatheter under fluoroscopy. Once the retriever was completely unsheathed, control angiogram was performed from the balloon catheter. The intermediate Ace 64 catheter was then advanced into the middle cerebral artery for local aspiration. Aspiration was then performed at the tip of the Ace catheter as the retriever was gently and slowly withdrawn with fluoroscopic  observation. Once the retriever was entirely removed from the system, free aspiration was confirmed at the hub of the intermediate catheter, with free blood return confirmed. Control angiogram was performed. Given incomplete reperfusion, another attempt was initiated. Has the microcatheter system was advanced under road map fluoroscopic guidance into the right middle cerebral artery, it was recognized that there was prolapse of the system into the aortic arch. The entire balloon catheter, intermediate catheter, microcatheter system had prolapsed into the aortic arch and require removal. Dr Earleen Newport was then present for the remainder of the case. Once the system was double flushed, a JB 1 catheter was advanced into the aortic arch and used to select the common carotid artery. Control angiogram was performed. The catheter was then used to select the common carotid artery with control run performed. The JB 1 catheter and roadrunner wire were then advanced into the proximal internal carotid artery, and after confirming location with a control angiogram, the wire was removed. Rosen wire was then placed into the internal carotid artery and the JB catheter  was removed. Again, 8 coaxial system of microcatheter, intermediate Ace 64 catheter, and the balloon guide were advanced over the Rosen wire into the internal carotid artery. Wire was removed and once the system was aspirated and flushed with pressurized heparin bags attached, a control angiogram was performed. The microcatheter and micro wire were then advanced across the occlusion for a second thrombectomy attempt. The second attempt was also local aspiration through the penumbra catheter and use of 4 x 40 solitaire stentreiver. Control angiogram demonstrated proximal embolization of the distal MCA with decreased TICI perfusion. Third attempt was performed using similar technique, local aspiration and solitaire 4 x 40 stentriever. In total 5 passes were made with local  aspiration via Ace 64 catheter and 4 x 40 stentriever. The final and sixth pass was performed to re-establish flow through a dominant insular branch, using a 4 x 30 Trevo stentriever and a 64 aspiration. Trevo aspiration with proximal flow control (TRAP) technique was used on the final pass, with reestablished near complete, TICI 2b flow. Catheter and wires were removed from the right-sided cerebral circulation. JB 1 catheter was readvanced to the aortic arch, and angiogram of the left carotid system was performed including cerebral images. JB 1 catheter was removed. Control angiogram was performed at the right common femoral artery puncture site. The 8 French sheath was exchanged for a standard 9 French sheath at the common femoral artery puncture site. Patient tolerated the procedure well and remained hemodynamically stable throughout. No complications were encountered and no significant blood loss encountered. FINDINGS: Initial cerebral angiogram Right common carotid artery: Normal course caliber and contour. Mild atherosclerotic changes at the right carotid bulb with no significant stenosis. Minimal tortuosity of the right ICA. Right external carotid artery: Patent with antegrade flow. Right internal carotid artery: Minimal tortuosity of the right internal carotid. Right MCA: Occlusion of the proximal right MCA, with no significant plaque present at the cavernous segment, ophthalmic segment, to the terminus. Ophthalmic artery patent. Posterior communicating artery patent. There is a temporal branch patent just proximal to the MCA occlusion. At the initiation of the case there is no significant collateral flow to the right MCA territory secondary to [facility] branches. Right ACA: A 1 segment patent. A 2 segment perfuses the right sided territory. No significant collateral filling of the right MCA territory upon initiation of the case. After the first pass, there is TICI 2b established. Persistent occlusion of a  proximal insular branch with absence of MCA cortical branches. After the second pass, there was more proximal embolization of the clot, with decreased perfusion to the right MCA territory. TICI 1 Third - fifth passes were useful in reestablishing TICI 2a flow. The final and sixth pass targeting large proximal insular branch occlusion was successful in reestablishing TICI 2b flow. Left common carotid artery:  Normal course caliber and contour. Left external carotid artery: Patent with antegrade flow. Left internal carotid artery: Moderate atherosclerotic changes at the left carotid bulb with no significant narrowing on the lateral view and the anterior view demonstrating approximately 58%. No significant tortuosity of the left carotid system. Left MCA: M1 segment patent. Insular and opercular segments patent. Unremarkable caliber and course of the cortical segments. Typical arterial, capillary/ parenchymal, and venous phase. Left ACA: A 1 segment patent. A 2 segment perfuses the left territory. IMPRESSION: Status post cerebral angiogram with mechanical thrombectomy of proximal right MCA occlusion and reestablished near-complete, TICI 2b flow to the right hemisphere. Left cerebral angiogram demonstrating adequate perfusion. Left cervical  angiogram demonstrates moderate carotid bulb disease, measuring approximately 58% stenosis. Signed, Dulcy Fanny. Earleen Newport, DO [facility]iology Electronically Signed   By: Corrie Mckusick D.O.   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   Review of Systems  Constitutional: Negative.   HENT: Negative.   Eyes: Negative.   Respiratory: Negative.   Cardiovascular: Negative.   Gastrointestinal: Negative.   Endocrine: Negative.   Genitourinary: Negative.   Musculoskeletal: Negative.   Skin: Negative.   Allergic/Immunologic: Negative.     Neurological: Negative.   Hematological: Negative.   Psychiatric/Behavioral: Negative.   All other systems reviewed and are negative.      Objective:    BP 110/62   Pulse 66   Temp 97.9 F (36.6 C)   Ht 5\' 9"  (1.753 m)   Wt 184 lb (83.5 kg)   SpO2 98%   BMI 27.17 kg/m    Physical Exam  General:  pleasant male in no acute distress Eyes:  PERRL Ears:  External ear exam shows no significant lesions or deformities.  TMs normal bilaterally Hearing is grossly normal bilaterally. Nose:  External nasal examination shows no deformity or inflammation. Nasal mucosa are pink and moist without lesions or exudates. Mouth:  Oral mucosa and oropharynx without lesions or exudates.  Teeth in good repair. Neck:  no carotid bruit or thyromegaly no cervical or supraclavicular lymphadenopathy  Lungs:  Normal respiratory effort, chest expands symmetrically. Lungs are clear to auscultation, no crackles or wheezes. Heart:  Normal rate and regular rhythm. S1 and S2 normal without gallop, murmur, click, rub or other extra sounds. Abdomen:  Bowel sounds positive,abdomen soft and non-tender without masses, organomegaly or hernias noted. Pulses:  R and L posterior tibial pulses are full and equal bilaterally  Extremities:  no edema  Psych:  Good eye contact, not anxious or depressed appearing Neuro:  CN II- XII intact, normal grip strength, no facial droop or dysarthria.       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

## 2016-10-05 NOTE — Addendum Note (Signed)
Addended by: Lucille Passy on: 10/05/2016 11:24 AM   Modules accepted: Orders

## 2016-10-05 NOTE — Assessment & Plan Note (Signed)
On lifelong eliquis 

## 2016-10-05 NOTE — Assessment & Plan Note (Addendum)
S/p tpa and thrombectomy. No residual deficits. Continue statin. ASA Has follow up with neurology, Dr. Mechele Claude on 12/26/16 and with IR, Dr. Estanislado Pandy on 10/28/16.

## 2016-10-10 ENCOUNTER — Other Ambulatory Visit: Payer: Self-pay | Admitting: *Deleted
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
## 2016-10-11 ENCOUNTER — Encounter: Payer: Self-pay | Admitting: *Deleted

## 2016-10-11 ENCOUNTER — Other Ambulatory Visit: Payer: Self-pay | Admitting: *Deleted

## 2016-10-11 NOTE — Patient Outreach (Signed)
[facility]) Care Management  10/11/2016  MOSHE TURCHETTA Mar 12, 1956 [id number]  EMMI-Stroke referral: Return call from patient. Patient was advised of reason for call. HIPPA verification received.   Patient states he is exposed to smoke daily because he is smoking. States he was down to 1/2 pack daily but now back to 1 pack daily while recuperating. States he tries to keep hands busy. Voices that he has spoken with MD regarding smoking cessation. States he currently wants to stop cold [place]. Voices he has literature on smoking cessation & does not need anymore at this time.  Patient voices that he understands- continuing to smoke increases his risk of having another stroke.    States he was recently hospitalized with left sided weakness & paralysis. Voices that he received IVtpa & thrombectomy of left lower leg. Voices that he is free of all symptoms now. States able to walk without problems. Voices that he is aware of stroke symptoms & will call 911 if they occur as he & roommate did with previous symptoms.   Patient voices that he has attended hospital follow up appointment with primary care provider and has set appointment with neurologist & interventional radiology. Voices no problem with transportation. States he has gotten prescriptions filled & is taking medications as prescribed. Voices understanding of importance of medication adherence.   EMMI call completed.   Plan: Send EMMI-educational Stroke information-Reducing stroke risk. Close case.   Sherrin Daisy, RN BSN [place] Management Coordinator [facility] Care Management  [phone]

## 2016-10-28 ENCOUNTER — Ambulatory Visit (HOSPITAL_COMMUNITY): Payer: Medicare HMO

## 2016-11-07 ENCOUNTER — Ambulatory Visit (HOSPITAL_COMMUNITY)
Admission: RE | Admit: 2016-11-07 | Discharge: 2016-11-07 | Disposition: A | Discharge status: Home / Self Care | Payer: Medicare HMO | Source: Ambulatory Visit | Attending: Radiology | Admitting: Radiology

## 2016-11-07 DIAGNOSIS — I6523 Occlusion and stenosis of bilateral carotid arteries: Secondary | ICD-10-CM | POA: Diagnosis not present

## 2016-11-07 DIAGNOSIS — I63412 Cerebral infarction due to embolism of left middle cerebral artery: Secondary | ICD-10-CM

## 2016-11-07 HISTORY — PX: IR GENERIC HISTORICAL: IMG1180011

## 2016-11-08 ENCOUNTER — Other Ambulatory Visit (HOSPITAL_COMMUNITY): Payer: Self-pay | Admitting: Interventional Radiology

## 2016-11-08 DIAGNOSIS — I639 Cerebral infarction, unspecified: Secondary | ICD-10-CM

## 2016-11-08 DIAGNOSIS — I771 Stricture of artery: Secondary | ICD-10-CM

## 2016-11-10 ENCOUNTER — Encounter (HOSPITAL_COMMUNITY): Payer: Self-pay | Admitting: Interventional Radiology

## 2016-11-14 ENCOUNTER — Encounter: Payer: Self-pay | Admitting: Family Medicine

## 2016-11-14 ENCOUNTER — Ambulatory Visit (INDEPENDENT_AMBULATORY_CARE_PROVIDER_SITE_OTHER): Payer: Medicare HMO | Admitting: Family Medicine

## 2016-11-14 VITALS — BP 112/70 | HR 54 | Temp 98.0°F | Ht 72.0 in | Wt 179.2 lb

## 2016-11-14 DIAGNOSIS — I63411 Cerebral infarction due to embolism of right middle cerebral artery: Secondary | ICD-10-CM

## 2016-11-14 DIAGNOSIS — G894 Chronic pain syndrome: Secondary | ICD-10-CM | POA: Diagnosis not present

## 2016-11-14 DIAGNOSIS — I10 Essential (primary) hypertension: Secondary | ICD-10-CM | POA: Diagnosis not present

## 2016-11-14 DIAGNOSIS — I2510 Atherosclerotic heart disease of native coronary artery without angina pectoris: Secondary | ICD-10-CM | POA: Diagnosis not present

## 2016-11-14 DIAGNOSIS — E785 Hyperlipidemia, unspecified: Secondary | ICD-10-CM

## 2016-11-14 DIAGNOSIS — E538 Deficiency of other specified B group vitamins: Secondary | ICD-10-CM

## 2016-11-14 DIAGNOSIS — Z0000 Encounter for general adult medical examination without abnormal findings: Secondary | ICD-10-CM | POA: Insufficient documentation

## 2016-11-14 DIAGNOSIS — R6882 Decreased libido: Secondary | ICD-10-CM | POA: Diagnosis not present

## 2016-11-14 DIAGNOSIS — I82402 Acute embolism and thrombosis of unspecified deep veins of left lower extremity: Secondary | ICD-10-CM | POA: Diagnosis not present

## 2016-11-14 DIAGNOSIS — R69 Illness, unspecified: Secondary | ICD-10-CM | POA: Diagnosis not present

## 2016-11-14 DIAGNOSIS — E119 Type 2 diabetes mellitus without complications: Secondary | ICD-10-CM | POA: Diagnosis not present

## 2016-11-14 LAB — VITAMIN B12

## 2016-11-14 LAB — TESTOSTERONE: TESTOSTERONE: 559.13 ng/dL (ref 300.00–890.00)

## 2016-11-14 MED ORDER — APIXABAN 5 MG PO TABS: 10.0000 mg | ORAL_TABLET | Freq: Two times a day (BID) | ORAL | 2 refills | Status: DC

## 2016-11-14 MED ORDER — HYDROCODONE-ACETAMINOPHEN 10-325 MG PO TABS: 1.0000 | ORAL_TABLET | ORAL | 0 refills | Status: DC | PRN

## 2016-11-14 MED ORDER — ALPRAZOLAM 1 MG PO TABS: ORAL_TABLET | ORAL | 0 refills | Status: DC

## 2016-11-14 NOTE — Assessment & Plan Note (Signed)
Diet controlled.  

## 2016-11-14 NOTE — Assessment & Plan Note (Signed)
Well controlled on current dose of zocor.  No changes made to rxs. 

## 2016-11-14 NOTE — Assessment & Plan Note (Signed)
Discussed with pt. We can certainly check his testosterone today but I did explain to him that I would be hesistant to place him on testosterone given his h/o DVT/PE. He agrees with this but would still like to know. Order placed.

## 2016-11-14 NOTE — Assessment & Plan Note (Signed)
On lifelong eliquis

## 2016-11-14 NOTE — Assessment & Plan Note (Signed)
The patients weight, height, BMI and visual acuity have been recorded in the chart I have made referrals, counseling and provided education to the patient based review of the above and I have provided the pt with a written personalized care plan for preventive services.  

## 2016-11-14 NOTE — Assessment & Plan Note (Signed)
Well controlled. No changes made to rxs today. 

## 2016-11-14 NOTE — Progress Notes (Signed)
Subjective:   Patient ID: [name], male    DOB: February 21, 1956, 61 y.o.   MRN: [id number]  [name] is a pleasant 61 y.o. year old male who presents to clinic today with Medicare Wellness (?? ABOUT LIBIDO ISSUES)  and follow up of chronic medical conditions on 11/14/2016  HPI:  I have personally reviewed the Medicare Annual Wellness questionnaire and have noted 1. The patient's medical and social history 2. Their use of alcohol, tobacco or illicit drugs 3. Their current medications and supplements 4. The patient's functional ability including ADL's, fall risks, home safety risks and hearing or visual             impairment. 5. Diet and physical activities 6. Evidence for depression or mood disorders  End of life wishes discussed and updated in Social History.  The roster of all physicians providing medical care to patient - is listed in the CareTeams section of the chart.  Pneumovax 10/31/12 Td 07/23/08 Colonoscopy 05/28/15  Diabetes- has been diet controlled. Lab Results  Component Value Date   HGBA1C 5.7 (H) 09/28/2016   He has had some "decreased libido."  He can get an erection but often cannot "complete the job." He feels he is in a great relationship.  He is very attracted to his girlfriend.  He wonders if his testosterone is low.  Recurrent DVT/PE- since embolic stroke in 04/5092, is now on life long anticoagulation on eliquis.  He has also been compliant with Zocor 80 mg daily.  Lab Results  Component Value Date   CHOL 107 09/28/2016   HDL 32 (L) 09/28/2016   LDLCALC 58 09/28/2016   LDLDIRECT 54.0 02/10/2015   TRIG 85 09/28/2016   CHOLHDL 3.3 09/28/2016   Lab Results  Component Value Date   ALT 11 02/09/2016   AST 25 02/09/2016   ALKPHOS 47 02/09/2016   BILITOT 0.4 02/09/2016   Current Outpatient Prescriptions on File Prior to Visit  Medication Sig Dispense Refill  . ALPRAZolam (XANAX) 1 MG tablet TAKE ONE TABLET BY MOUTH THREE TIMES DAILY AS  NEEDED FOR ANXIETY 90 tablet 0  . apixaban (ELIQUIS) 5 MG TABS tablet Take 2 tablets (10 mg total) by mouth 2 (two) times daily. 60 tablet 2  . aspirin 81 MG tablet Take 81 mg by mouth daily.      . Calcium Citrate-Vitamin D 500-400 MG-UNIT CHEW Chew 1 tablet by mouth 2 (two) times daily.      . Cholecalciferol (VITAMIN D) 2000 UNITS tablet Take 5,000 Units by mouth daily.     . cyanocobalamin 500 MCG tablet Take 5,000 mcg by mouth daily.     . fexofenadine (ALLEGRA) 180 MG tablet Take 180 mg by mouth daily as needed.    Marland Kitchen HYDROcodone-acetaminophen (NORCO) 10-325 MG tablet Take 1 tablet by mouth every 4 (four) hours as needed for severe pain. 150 tablet 0  . Multiple Vitamin (MULTIVITAMIN) tablet Take 1 tablet by mouth daily.      . nitroGLYCERIN (NITROSTAT) 0.3 MG SL tablet Place 1 tablet (0.3 mg total) under the tongue every 5 (five) minutes as needed. 90 tablet 3  . simvastatin (ZOCOR) 80 MG tablet Take 1 tablet (80 mg total) by mouth at bedtime. 90 tablet 1   No current facility-administered medications on file prior to visit.     Allergies  Allergen Reactions  . Codeine     REACTION: makes me "looney"    Past Medical History:  Diagnosis Date  .  Anemia   . Arthritis   . CAD (coronary artery disease)   . Diabetes mellitus   . Hyperlipidemia   . Hypertension   . Left ventricular dysfunction   . Low back pain   . MI (myocardial infarction)   . Obesity   . PE (pulmonary embolism)   . Tobacco abuse     Past Surgical History:  Procedure Laterality Date  . CHOLECYSTECTOMY    . CORONARY ARTERY BYPASS GRAFT    . CORONARY STENT PLACEMENT     3 all in right side  . GASTRIC BYPASS    . HERNIA REPAIR    . IR GENERIC HISTORICAL  09/27/2016   IR ANGIO INTRA EXTRACRAN SEL COM CAROTID INNOMINATE UNI L MOD SED 09/27/2016 Luanne Bras, MD [facility]  . IR GENERIC HISTORICAL  09/27/2016   IR PERCUTANEOUS ART THROMBECTOMY/INFUSION INTRACRANIAL INC DIAG ANGIO 09/27/2016 Luanne Bras, MD [facility]  . IR GENERIC HISTORICAL  11/07/2016   IR RADIOLOGIST EVAL & MGMT 11/07/2016 [facility]  . RADIOLOGY WITH ANESTHESIA N/A 09/27/2016   Procedure: RADIOLOGY WITH ANESTHESIA;  Surgeon: Luanne Bras, MD;  Location: [place];  Service: Radiology;  Laterality: N/A;    Family History  Problem Relation Age of Onset  . Emphysema Mother   . Heart attack Father   . Heart disease Daughter   . Heart disease Brother   . Arthritis Brother   . Hemophilia Brother   . Colon cancer Neg Hx     Social History   Social History  . Marital status: Widowed    Spouse name: N/A  . Number of children: N/A  . Years of education: N/A   Occupational History  . Not on file.   Social History Main Topics  . Smoking status: Current Every Day Smoker    Packs/day: 1.00    Years: 40.00    Types: Cigarettes  . Smokeless tobacco: Former Systems developer  . Alcohol use No  . Drug use: No  . Sexual activity: Yes   Other Topics Concern  . Not on file   Social History Narrative   Would desire CPR.   Does not desire long term life support or feeding tubes.         The PMH, PSH, Social History, Family History, Medications, and allergies have been reviewed in [facility], and have been updated if relevant.   Review of Systems  Constitutional: Negative.   HENT: Negative.   Eyes: Negative.   Respiratory: Negative.   Cardiovascular: Negative.   Gastrointestinal: Negative.   Endocrine: Negative.   Genitourinary: Negative.   Musculoskeletal: Negative.   Skin: Negative.   Allergic/Immunologic: Negative.   Neurological: Negative.   Hematological: Negative.   Psychiatric/Behavioral: Negative.   All other systems reviewed and are negative.      Objective:    BP 112/70   Pulse (!) 54   Temp 98 F (36.7 C)   Ht 6' (1.829 m)   Wt 179 lb 4 oz (81.3 kg)   SpO2 96%   BMI 24.31 kg/m   Wt Readings from Last 3 Encounters:  11/14/16 179 lb 4 oz (81.3 kg)  10/05/16 184 lb (83.5 kg)    09/27/16 178 lb 2.1 oz (80.8 kg)    Physical Exam   General:  pleasant male in no acute distress Eyes:  PERRL Ears:  External ear exam shows no significant lesions or deformities.  TMs normal bilaterally Hearing is grossly normal bilaterally. Nose:  External nasal examination shows no  deformity or inflammation. Nasal mucosa are pink and moist without lesions or exudates. Mouth:  Oral mucosa and oropharynx without lesions or exudates.  Teeth in good repair. Neck:  no carotid bruit or thyromegaly no cervical or supraclavicular lymphadenopathy  Lungs:  Normal respiratory effort, chest expands symmetrically. Lungs are clear to auscultation, no crackles or wheezes. Heart:  Normal rate and regular rhythm. S1 and S2 normal without gallop, murmur, click, rub or other extra sounds. Abdomen:  Bowel sounds positive,abdomen soft and non-tender without masses, organomegaly or hernias noted. 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

## 2016-11-15 ENCOUNTER — Ambulatory Visit (HOSPITAL_COMMUNITY)
Admission: RE | Admit: 2016-11-15 | Discharge: 2016-11-15 | Disposition: A | Discharge status: Home / Self Care | Payer: Medicare HMO | Source: Ambulatory Visit | Attending: Interventional Radiology | Admitting: Interventional Radiology

## 2016-11-15 DIAGNOSIS — I6523 Occlusion and stenosis of bilateral carotid arteries: Secondary | ICD-10-CM | POA: Diagnosis not present

## 2016-11-15 DIAGNOSIS — I771 Stricture of artery: Secondary | ICD-10-CM | POA: Diagnosis not present

## 2016-11-15 DIAGNOSIS — I639 Cerebral infarction, unspecified: Secondary | ICD-10-CM | POA: Diagnosis not present

## 2016-11-15 LAB — VAS US CAROTID
LCCADDIAS: 24 cm/s
LCCADSYS: 60 cm/s
LEFT ECA DIAS: -15 cm/s
LEFT VERTEBRAL DIAS: -14 cm/s
LICADSYS: -81 cm/s
LICAPDIAS: -28 cm/s
Left CCA prox dias: 25 cm/s
Left CCA prox sys: 86 cm/s
Left ICA dist dias: -36 cm/s
Left ICA prox sys: -102 cm/s
RCCAPDIAS: 15 cm/s
RCCAPSYS: 79 cm/s
RIGHT ECA DIAS: -14 cm/s
RIGHT VERTEBRAL DIAS: -10 cm/s
Right cca dist sys: -71 cm/s

## 2016-11-15 NOTE — Progress Notes (Signed)
VASCULAR LAB PRELIMINARY  PRELIMINARY  PRELIMINARY  PRELIMINARY  Carotid duplex completed.    Preliminary report: Bilateral:  1-39% ICA stenosis.  Vertebral artery flow is antegrade.     Cloie Wooden, [place], RVS 11/15/2016, 11:02 AM

## 2016-11-17 ENCOUNTER — Telehealth: Payer: Self-pay

## 2016-11-17 NOTE — Telephone Encounter (Signed)
Yes he is cleared to have a dental cleaning.

## 2016-11-17 NOTE — Telephone Encounter (Signed)
Left VM for [facility] that   per Dr Deborra Medina Mr Adduci is cleared to have dental cleaning

## 2016-11-17 NOTE — Telephone Encounter (Signed)
[name] with [employer] [employer] [facility] said pt is scheduled for dental cleaning and has advised her that he had a stroke and was started on Eliquis; [name] needs medical clearance for dental cleaning. Last annual 11/14/16. [name] request cb.

## 2016-11-18 ENCOUNTER — Telehealth (HOSPITAL_COMMUNITY): Payer: Self-pay

## 2016-11-18 NOTE — Telephone Encounter (Signed)
Called, left message for pt to call back. AW

## 2016-11-21 ENCOUNTER — Telehealth (HOSPITAL_COMMUNITY): Payer: Self-pay

## 2016-11-21 NOTE — Telephone Encounter (Signed)
Pt agreed to f/u in 6 months with a us carotid. AW 

## 2016-12-13 ENCOUNTER — Other Ambulatory Visit: Payer: Self-pay | Admitting: Family Medicine

## 2016-12-13 MED ORDER — ALPRAZOLAM 1 MG PO TABS: ORAL_TABLET | ORAL | 0 refills | Status: DC

## 2016-12-13 MED ORDER — HYDROCODONE-ACETAMINOPHEN 10-325 MG PO TABS: 1.0000 | ORAL_TABLET | ORAL | 0 refills | Status: DC | PRN

## 2016-12-13 NOTE — Telephone Encounter (Signed)
Both last filled 11-14-16 ALPRAZOLAM #90  Hydrocodone #150  Last OV [facility] 11-14-16 No Future OV

## 2016-12-13 NOTE — Telephone Encounter (Signed)
Spoke to pt. Rxs up front ready for pickup. 

## 2016-12-26 ENCOUNTER — Ambulatory Visit: Payer: Medicare HMO | Admitting: Neurology

## 2017-01-03 ENCOUNTER — Encounter: Payer: Self-pay | Admitting: Family Medicine

## 2017-01-03 ENCOUNTER — Ambulatory Visit (INDEPENDENT_AMBULATORY_CARE_PROVIDER_SITE_OTHER): Payer: Medicare HMO | Admitting: Family Medicine

## 2017-01-03 DIAGNOSIS — K219 Gastro-esophageal reflux disease without esophagitis: Secondary | ICD-10-CM

## 2017-01-03 NOTE — Assessment & Plan Note (Signed)
Improved with short course of PPI. Advised okay to continue this as needed. It can decrease absorption of some of his rxs so should be taken separately. The patient indicates understanding of these issues and agrees with the plan. Call or return to clinic prn if these symptoms worsen or fail to improve as anticipated.

## 2017-01-03 NOTE — Progress Notes (Signed)
Subjective:   Patient ID: [name], male    DOB: 1956/06/22, 61 y.o.   MRN: [id number]  [name] is a pleasant 61 y.o. year old male who presents to clinic today with Gastroesophageal Reflux  on 01/03/2017  HPI:  GERD- Has had intermittent GERD symptoms on and off since starting eliquis.  Was afraid to take anything but recently started taking OTC prilosec 20 mg daily.  Symptoms have already improved. Cutting back on coffee as well.  No black or blood stools.  No nausea or vomiting.  Current Outpatient Prescriptions on File Prior to Visit  Medication Sig Dispense Refill  . ALPRAZolam (XANAX) 1 MG tablet TAKE ONE TABLET BY MOUTH THREE TIMES DAILY AS NEEDED FOR ANXIETY 90 tablet 0  . apixaban (ELIQUIS) 5 MG TABS tablet Take 2 tablets (10 mg total) by mouth 2 (two) times daily. 60 tablet 2  . aspirin 81 MG tablet Take 81 mg by mouth daily.      . Calcium Citrate-Vitamin D 500-400 MG-UNIT CHEW Chew 1 tablet by mouth 2 (two) times daily.      . Cholecalciferol (VITAMIN D) 2000 UNITS tablet Take 5,000 Units by mouth daily.     . cyanocobalamin 500 MCG tablet Take 5,000 mcg by mouth daily.     . fexofenadine (ALLEGRA) 180 MG tablet Take 180 mg by mouth daily as needed.    Marland Kitchen HYDROcodone-acetaminophen (NORCO) 10-325 MG tablet Take 1 tablet by mouth every 4 (four) hours as needed for severe pain. 150 tablet 0  . Multiple Vitamin (MULTIVITAMIN) tablet Take 1 tablet by mouth daily.      . nitroGLYCERIN (NITROSTAT) 0.3 MG SL tablet Place 1 tablet (0.3 mg total) under the tongue every 5 (five) minutes as needed. 90 tablet 3  . polyethylene glycol powder (GLYCOLAX/MIRALAX) powder Take 1 Container by mouth once. 1/2 SCOP DAILY    . simvastatin (ZOCOR) 80 MG tablet Take 1 tablet (80 mg total) by mouth at bedtime. 90 tablet 1   No current facility-administered medications on file prior to visit.     Allergies  Allergen Reactions  . Codeine     REACTION: makes me "looney"    Past  Medical History:  Diagnosis Date  . Anemia   . Arthritis   . CAD (coronary artery disease)   . Diabetes mellitus   . Hyperlipidemia   . Hypertension   . Left ventricular dysfunction   . Low back pain   . MI (myocardial infarction) ([place])   . Obesity   . PE (pulmonary embolism)   . Tobacco abuse     Past Surgical History:  Procedure Laterality Date  . CHOLECYSTECTOMY    . CORONARY ARTERY BYPASS GRAFT    . CORONARY STENT PLACEMENT     3 all in right side  . GASTRIC BYPASS    . HERNIA REPAIR    . IR GENERIC HISTORICAL  09/27/2016   IR ANGIO INTRA EXTRACRAN SEL COM CAROTID INNOMINATE UNI L MOD SED 09/27/2016 Luanne Bras, MD [facility]  . IR GENERIC HISTORICAL  09/27/2016   IR PERCUTANEOUS ART THROMBECTOMY/INFUSION INTRACRANIAL INC DIAG ANGIO 09/27/2016 Luanne Bras, MD [facility]  . IR GENERIC HISTORICAL  11/07/2016   IR RADIOLOGIST EVAL & MGMT 11/07/2016 [facility]  . RADIOLOGY WITH ANESTHESIA N/A 09/27/2016   Procedure: RADIOLOGY WITH ANESTHESIA;  Surgeon: Luanne Bras, MD;  Location: [place];  Service: Radiology;  Laterality: N/A;    Family History  Problem Relation Age  of Onset  . Emphysema Mother   . Heart attack Father   . Heart disease Daughter   . Heart disease Brother   . Arthritis Brother   . Hemophilia Brother   . Colon cancer Neg Hx     Social History   Social History  . Marital status: Widowed    Spouse name: N/A  . Number of children: N/A  . Years of education: N/A   Occupational History  . Not on file.   Social History Main Topics  . Smoking status: Current Every Day Smoker    Packs/day: 1.00    Years: 40.00    Types: Cigarettes  . Smokeless tobacco: Former Systems developer  . Alcohol use No  . Drug use: No  . Sexual activity: Yes   Other Topics Concern  . Not on file   Social History Narrative   Would desire CPR.   Does not desire long term life support or feeding tubes.         The PMH, PSH, Social History, Family History,  Medications, and allergies have been reviewed in [facility], and have been updated if relevant.   Review of Systems  Constitutional: Negative.   Gastrointestinal: Positive for abdominal pain. Negative for abdominal distention, anal bleeding, blood in stool, constipation, diarrhea, nausea and rectal pain.  All other systems reviewed and are negative.      Objective:    BP 120/70   Pulse (!) 40   Temp 97.7 F (36.5 C)   Wt 179 lb (81.2 kg)   SpO2 96%   BMI 24.28 kg/m    Physical Exam  Constitutional: He is oriented to person, place, and time. He appears well-developed and well-nourished. No distress.  HENT:  Head: Normocephalic and atraumatic.  Eyes: Conjunctivae are normal.  Cardiovascular: Normal rate.   Pulmonary/Chest: Effort normal.  Abdominal: Soft. Bowel sounds are normal. He exhibits no distension. There is no tenderness. There is no rebound.  Musculoskeletal: Normal range of motion.  Neurological: He is alert and oriented to person, place, and time. No cranial nerve deficit.  Skin: Skin is warm and dry. He is not diaphoretic.  Psychiatric: He has a normal mood and affect. His behavior is normal. Judgment and thought content normal.  Nursing note and vitals reviewed.         Assessment & Plan:   Gastroesophageal reflux disease, esophagitis presence not specified No Follow-up on file.

## 2017-01-03 NOTE — Progress Notes (Signed)
Pre visit review using our clinic review tool, if applicable. No additional management support is needed unless otherwise documented below in the visit note. 

## 2017-01-03 NOTE — Patient Instructions (Signed)
Great to see you.  Let's try prilosec every other day.  Keep me updated.

## 2017-01-12 ENCOUNTER — Other Ambulatory Visit: Payer: Self-pay

## 2017-01-16 ENCOUNTER — Other Ambulatory Visit: Payer: Self-pay | Admitting: Family Medicine

## 2017-01-16 MED ORDER — ALPRAZOLAM 1 MG PO TABS: ORAL_TABLET | ORAL | 0 refills | Status: DC

## 2017-01-16 MED ORDER — HYDROCODONE-ACETAMINOPHEN 10-325 MG PO TABS: 1.0000 | ORAL_TABLET | ORAL | 0 refills | Status: DC | PRN

## 2017-01-16 NOTE — Telephone Encounter (Signed)
RX placed in blue folder ready for pick up, patient aware.

## 2017-01-16 NOTE — Telephone Encounter (Signed)
Alprazolam last filled on 12/13/16 #90 Hydrocodone last filled on 12/13/16 #150 Last OV 01/03/17

## 2017-01-19 ENCOUNTER — Encounter: Payer: Self-pay | Admitting: Family Medicine

## 2017-01-20 ENCOUNTER — Encounter: Payer: Self-pay | Admitting: Family Medicine

## 2017-01-20 DIAGNOSIS — Z79899 Other long term (current) drug therapy: Secondary | ICD-10-CM | POA: Diagnosis not present

## 2017-01-20 DIAGNOSIS — Z79891 Long term (current) use of opiate analgesic: Secondary | ICD-10-CM | POA: Diagnosis not present

## 2017-02-20 ENCOUNTER — Other Ambulatory Visit: Payer: Self-pay | Admitting: Family Medicine

## 2017-02-21 MED ORDER — HYDROCODONE-ACETAMINOPHEN 10-325 MG PO TABS: 1.0000 | ORAL_TABLET | ORAL | 0 refills | Status: DC | PRN

## 2017-02-21 MED ORDER — ALPRAZOLAM 1 MG PO TABS: ORAL_TABLET | ORAL | 0 refills | Status: DC

## 2017-02-21 NOTE — Telephone Encounter (Signed)
RX placed in blue folder ready for pick up, patient aware.

## 2017-02-21 NOTE — Telephone Encounter (Signed)
anax last filled on 01/16/17 [place] 01/16/17  LAST OV 01/03/17

## 2017-03-06 NOTE — Patient Outreach (Signed)
Confirmed - 3 attempts to reach patient. mRS will be recorded as 7 due to unable to reach.

## 2017-03-07 ENCOUNTER — Ambulatory Visit: Payer: Medicare HMO | Admitting: Neurology

## 2017-03-13 ENCOUNTER — Other Ambulatory Visit: Payer: Self-pay

## 2017-03-13 NOTE — Telephone Encounter (Signed)
This encounter was created in error - please disregard.

## 2017-03-13 NOTE — Patient Outreach (Signed)
3 telephone outreach attempts were made on 01/06/17, 01/10/17 & 01/12/17. Left messages but no call was returned. mRS = 7

## 2017-03-13 NOTE — Addendum Note (Signed)
Addended by: Virgel Manifold on: 03/13/2017 04:45 PM   Modules accepted: Level of Service, SmartSet

## 2017-03-17 ENCOUNTER — Other Ambulatory Visit: Payer: Self-pay | Admitting: Family Medicine

## 2017-03-17 NOTE — Telephone Encounter (Signed)
Pt left v/m requesting status of refill for eliquis. Left v/m for pt to ck with [employer].

## 2017-03-22 ENCOUNTER — Other Ambulatory Visit: Payer: Self-pay | Admitting: Family Medicine

## 2017-03-22 MED ORDER — ALPRAZOLAM 1 MG PO TABS: ORAL_TABLET | ORAL | 0 refills | Status: DC

## 2017-03-22 MED ORDER — HYDROCODONE-ACETAMINOPHEN 10-325 MG PO TABS: 1.0000 | ORAL_TABLET | ORAL | 0 refills | Status: DC | PRN

## 2017-03-22 NOTE — Telephone Encounter (Signed)
Last refill 02/21/17 Last OV 01/03/17 Ok to refill?

## 2017-04-13 ENCOUNTER — Other Ambulatory Visit: Payer: Self-pay | Admitting: *Deleted

## 2017-04-13 MED ORDER — APIXABAN 5 MG PO TABS: ORAL_TABLET | ORAL | 0 refills | Status: DC

## 2017-04-13 NOTE — Telephone Encounter (Signed)
Pt called Triage line requesting refill of eliquis, refilled once since he has a f/u with Dr. Deborra Medina next week

## 2017-04-18 ENCOUNTER — Ambulatory Visit (INDEPENDENT_AMBULATORY_CARE_PROVIDER_SITE_OTHER): Payer: Medicare HMO | Admitting: Family Medicine

## 2017-04-18 ENCOUNTER — Encounter: Payer: Self-pay | Admitting: Family Medicine

## 2017-04-18 VITALS — BP 110/62 | HR 67 | Temp 98.0°F | Resp 16 | Ht 72.0 in | Wt 175.8 lb

## 2017-04-18 DIAGNOSIS — G894 Chronic pain syndrome: Secondary | ICD-10-CM | POA: Diagnosis not present

## 2017-04-18 DIAGNOSIS — I63412 Cerebral infarction due to embolism of left middle cerebral artery: Secondary | ICD-10-CM | POA: Diagnosis not present

## 2017-04-18 DIAGNOSIS — E119 Type 2 diabetes mellitus without complications: Secondary | ICD-10-CM | POA: Diagnosis not present

## 2017-04-18 DIAGNOSIS — E785 Hyperlipidemia, unspecified: Secondary | ICD-10-CM

## 2017-04-18 MED ORDER — HYDROCODONE-ACETAMINOPHEN 10-325 MG PO TABS: 1.0000 | ORAL_TABLET | ORAL | 0 refills | Status: DC | PRN

## 2017-04-18 MED ORDER — APIXABAN 5 MG PO TABS: ORAL_TABLET | ORAL | 0 refills | Status: DC

## 2017-04-18 MED ORDER — ALPRAZOLAM 1 MG PO TABS: ORAL_TABLET | ORAL | 0 refills | Status: DC

## 2017-04-18 NOTE — Assessment & Plan Note (Signed)
Continue current dose of statin 

## 2017-04-18 NOTE — Assessment & Plan Note (Signed)
Followed by neuro. On eliquis. Has appt tomorrow.

## 2017-04-18 NOTE — Assessment & Plan Note (Signed)
UDS and pain contract UTD. Three rxs of norco printed and given to pt.

## 2017-04-18 NOTE — Progress Notes (Signed)
Subjective:   Patient ID: Patterson Dennis, male    DOB: 1956-06-23, 61 y.o.   MRN: [id number]  [name] is a pleasant 61 y.o. year old male who presents to clinic today with Follow-up (DM, cholesterol, HTN, Back, Hip, Knee pain)  on 04/18/2017  HPI:  Chronic pain- back, hip, knee.  Feels narcotics take the edge off.  Indication for chronic opioid: DDD/DJD Medication and dose Norco 10/325- 1 tab every 4 hours as needed for pain # pills per month: 150 Last UDS date: 09/28/16 Pain contract signed (Y/N): Y Date narcotic database last reviewed (include red flags): 04/18/17   Diabetes- has been diet controlled.  Lab Results  Component Value Date   HGBA1C 5.7 (H) 09/28/2016     Recurrent DVT/PE- since embolic stroke in 01/7340, is now on life long anticoagulation on eliquis.  He has also been compliant with Zocor 80 mg daily.  Lab Results  Component Value Date   CHOL 107 09/28/2016   HDL 32 (L) 09/28/2016   LDLCALC 58 09/28/2016   LDLDIRECT 54.0 02/10/2015   TRIG 85 09/28/2016   CHOLHDL 3.3 09/28/2016   Lab Results  Component Value Date   ALT 11 02/09/2016   AST 25 02/09/2016   ALKPHOS 47 02/09/2016   BILITOT 0.4 02/09/2016   Current Outpatient Prescriptions on File Prior to Visit  Medication Sig Dispense Refill  . aspirin 81 MG tablet Take 81 mg by mouth daily.      . Calcium Citrate-Vitamin D 500-400 MG-UNIT CHEW Chew 1 tablet by mouth 2 (two) times daily.      . Cholecalciferol (VITAMIN D) 2000 UNITS tablet Take 5,000 Units by mouth daily.     . cyanocobalamin 500 MCG tablet Take 5,000 mcg by mouth daily.     . fexofenadine (ALLEGRA) 180 MG tablet Take 180 mg by mouth daily as needed.    . Multiple Vitamin (MULTIVITAMIN) tablet Take 1 tablet by mouth daily.      . nitroGLYCERIN (NITROSTAT) 0.3 MG SL tablet Place 1 tablet (0.3 mg total) under the tongue every 5 (five) minutes as needed. 90 tablet 3  . omeprazole (PRILOSEC) 20 MG capsule Take 20 mg by mouth daily.      . polyethylene glycol powder (GLYCOLAX/MIRALAX) powder Take 1 Container by mouth once. 1/2 SCOP DAILY    . simvastatin (ZOCOR) 80 MG tablet Take 1 tablet (80 mg total) by mouth at bedtime. 90 tablet 1   No current facility-administered medications on file prior to visit.     Allergies  Allergen Reactions  . Codeine     REACTION: makes me "looney"    Past Medical History:  Diagnosis Date  . Anemia   . Arthritis   . CAD (coronary artery disease)   . Diabetes mellitus   . Hyperlipidemia   . Hypertension   . Left ventricular dysfunction   . Low back pain   . MI (myocardial infarction) ([place])   . Obesity   . PE (pulmonary embolism)   . Tobacco abuse     Past Surgical History:  Procedure Laterality Date  . CHOLECYSTECTOMY    . CORONARY ARTERY BYPASS GRAFT    . CORONARY STENT PLACEMENT     3 all in right side  . GASTRIC BYPASS    . HERNIA REPAIR    . IR GENERIC HISTORICAL  09/27/2016   IR ANGIO INTRA EXTRACRAN SEL COM CAROTID INNOMINATE UNI L MOD SED 09/27/2016 Luanne Bras, MD [facility]  .  IR GENERIC HISTORICAL  09/27/2016   IR PERCUTANEOUS ART THROMBECTOMY/INFUSION INTRACRANIAL INC DIAG ANGIO 09/27/2016 Luanne Bras, MD [facility]  . IR GENERIC HISTORICAL  11/07/2016   IR RADIOLOGIST EVAL & MGMT 11/07/2016 [facility]  . RADIOLOGY WITH ANESTHESIA N/A 09/27/2016   Procedure: RADIOLOGY WITH ANESTHESIA;  Surgeon: Luanne Bras, MD;  Location: [place];  Service: Radiology;  Laterality: N/A;    Family History  Problem Relation Age of Onset  . Emphysema Mother   . Heart attack Father   . Heart disease Daughter   . Heart disease Brother   . Arthritis Brother   . Hemophilia Brother   . Colon cancer Neg Hx     Social History   Social History  . Marital status: Widowed    Spouse name: N/A  . Number of children: N/A  . Years of education: N/A   Occupational History  . Not on file.   Social History Main Topics  . Smoking status: Current Every Day  Smoker    Packs/day: 1.00    Years: 40.00    Types: Cigarettes  . Smokeless tobacco: Former Systems developer  . Alcohol use No  . Drug use: No  . Sexual activity: Yes   Other Topics Concern  . Not on file   Social History Narrative   Would desire CPR.   Does not desire long term life support or feeding tubes.         The PMH, PSH, Social History, Family History, Medications, and allergies have been reviewed in [facility], and have been updated if relevant.    Review of Systems  HENT: Negative.   Eyes: Negative.   Respiratory: Negative.   Cardiovascular: Negative.   Gastrointestinal: Negative.   Endocrine: Negative.   Genitourinary: Negative.   Musculoskeletal: Negative.   Allergic/Immunologic: Negative.   Neurological: Negative.   Hematological: Negative.   Psychiatric/Behavioral: Negative.   All other systems reviewed and are negative.      Objective:    BP 110/62   Pulse 67   Temp 98 F (36.7 C) (Oral)   Resp 16   Ht 6' (1.829 m)   Wt 175 lb 12.8 oz (79.7 kg)   SpO2 96%   BMI 23.84 kg/m    Physical Exam  Constitutional: He is oriented to person, place, and time. He appears well-developed and well-nourished. No distress.  HENT:  Head: Normocephalic and atraumatic.  Eyes: Conjunctivae are normal.  Cardiovascular: Normal rate.   Pulmonary/Chest: Effort normal.  Musculoskeletal: Normal range of motion. He exhibits no edema.  Neurological: He is alert and oriented to person, place, and time. No cranial nerve deficit.  Skin: Skin is warm and dry. He is not diaphoretic.  Psychiatric: He has a normal mood and affect. His behavior is normal. Judgment and thought content normal.  Nursing note and vitals reviewed.         Assessment & Plan:   No diagnosis found. No Follow-up on file.

## 2017-04-18 NOTE — Assessment & Plan Note (Signed)
Diet controlled. a1c UTD.

## 2017-04-19 ENCOUNTER — Ambulatory Visit (INDEPENDENT_AMBULATORY_CARE_PROVIDER_SITE_OTHER): Payer: Medicare HMO | Admitting: Neurology

## 2017-04-19 ENCOUNTER — Encounter: Payer: Self-pay | Admitting: Neurology

## 2017-04-19 VITALS — BP 101/58 | HR 71 | Ht 72.0 in | Wt 175.4 lb

## 2017-04-19 DIAGNOSIS — E785 Hyperlipidemia, unspecified: Secondary | ICD-10-CM | POA: Diagnosis not present

## 2017-04-19 DIAGNOSIS — Z951 Presence of aortocoronary bypass graft: Secondary | ICD-10-CM | POA: Diagnosis not present

## 2017-04-19 DIAGNOSIS — I82432 Acute embolism and thrombosis of left popliteal vein: Secondary | ICD-10-CM | POA: Diagnosis not present

## 2017-04-19 DIAGNOSIS — Z86711 Personal history of pulmonary embolism: Secondary | ICD-10-CM | POA: Diagnosis not present

## 2017-04-19 DIAGNOSIS — I63412 Cerebral infarction due to embolism of left middle cerebral artery: Secondary | ICD-10-CM | POA: Diagnosis not present

## 2017-04-19 NOTE — Patient Instructions (Addendum)
-   continue eliquis and ASA and zocor for stroke and heart prevention - quit smoking - Follow up with your primary care physician for stroke risk factor modification. Recommend maintain blood pressure goal <140/80, diabetes with hemoglobin A1c goal below 7.0% and lipids with LDL cholesterol goal below 70 mg/dL.  - check BP at home  - healthy diet and regular exercise - follow up with Dr. Estanislado Pandy for carotid stenosis monitoring - follow up in 6 months.

## 2017-04-20 DIAGNOSIS — Z951 Presence of aortocoronary bypass graft: Secondary | ICD-10-CM | POA: Insufficient documentation

## 2017-04-20 DIAGNOSIS — Z86711 Personal history of pulmonary embolism: Secondary | ICD-10-CM | POA: Insufficient documentation

## 2017-04-20 NOTE — Progress Notes (Signed)
STROKE NEUROLOGY FOLLOW UP NOTE  NAME: DAXTER PAULE DOB: 11-09-1955  REASON FOR VISIT: stroke follow up HISTORY FROM: pt and chart  Today we had the pleasure of seeing PRASHANT GLOSSER in follow-up at our Neurology Clinic. Pt was accompanied by no one.   History Summary Mr.Noe J Culpis a 61 y.o.malewith history of hypertension, diabetes, hyperlipidemia, PE, CAD s/p CABG x 4,and smoking admitted on 09/27/16 for left body hemiparesis. HereceivedIV t-PA. CT head showed hyperdense right MCA and CTA head and neck showed LVO with right M1 and proximal left ICA 80% stenosis. He was also taken to IR where he received TICI 2b revascularization of R MCA occlusion with mechanical thrombectomy. The left ICA was calculated at 58% stenosis. MRI showed right MCA patchy infarcts with old right inferior frontal lobe encephalomalacia. EF 35-40%. LDL 58 and A1C 5.7. Stroke etiology not sure, large vessel atherosclerosis (carotid stenosis, CAD s/p CABG) vs.cardioembolic (low EF, undiagnosed afib). However, his LE venous doppler showed left popliteal vein acute/age undetermined DVT, but seems not chronic. TCD bubble study no PFO. Due to recurrent PE/DVT, he was put on long term AC with eliquis. ASA 81mg  continued due to CAD s/p CABG. Zocor continued.   Interval History During the interval time, the patient has been doing well. BP today on the low side, 101/58. On eliquis and ASA, no bleeding. Still smoking, and trying to quit. Smoke cessation again provided. Followed up with Dr. Estanislado Pandy and had CUS done in 11/2016 showed no significant left ICA stenosis. Otherwise, no complains.   REVIEW OF SYSTEMS: Full 14 system review of systems performed and notable only for those listed below and in HPI above, all others are negative:  Constitutional:  Appetite change Cardiovascular:  Ear/Nose/Throat:   Skin:  Eyes:   Respiratory:   Gastroitestinal:   Genitourinary:  Hematology/Lymphatic:   Endocrine:    Musculoskeletal:   Allergy/Immunology:   Neurological:   Psychiatric:  Sleep:   The following represents the patient's updated allergies and side effects list: Allergies  Allergen Reactions  . Codeine     REACTION: makes me "looney"    The neurologically relevant items on the patient's problem list were reviewed on today's visit.  Neurologic Examination  A problem focused neurological exam (12 or more points of the single system neurologic examination, vital signs counts as 1 point, cranial nerves count for 8 points) was performed.  Blood pressure (!) 101/58, pulse 71, height 6' (1.829 m), weight 175 lb 6.4 oz (79.6 kg).  General - Well nourished, well developed, in no apparent distress.  Ophthalmologic - Sharp disc margins OU.   Cardiovascular - Regular rate and rhythm with no murmur.  Mental Status -  Level of arousal and orientation to time, place, and person were intact. Language including expression, naming, repetition, comprehension was assessed and found intact. Attention span and concentration were normal. Fund of Knowledge was assessed and was intact.  Cranial Nerves II - XII - II - Visual field intact OU. III, IV, VI - Extraocular movements intact. V - Facial sensation intact bilaterally. VII - Facial movement intact bilaterally. VIII - Hearing & vestibular intact bilaterally. X - Palate elevates symmetrically. XI - Chin turning & shoulder shrug intact bilaterally. XII - Tongue protrusion intact.  Motor Strength - The patient's strength was normal in all extremities and pronator drift was absent.  Bulk was normal and fasciculations were absent.   Motor Tone - Muscle tone was assessed at the neck and appendages and  was normal.  Reflexes - The patient's reflexes were 1+ in all extremities and he had no pathological reflexes.  Sensory - Light touch, temperature/pinprick, vibration and proprioception, and Romberg testing were assessed and were normal.     Coordination - The patient had normal movements in the hands and feet with no ataxia or dysmetria.  Tremor was absent.  Gait and Station - The patient's transfers, posture, gait, station, and turns were observed as normal.   Functional score  mRS = 0   0 - No symptoms.   1 - No significant disability. Able to carry out all usual activities, despite some symptoms.   2 - Slight disability. Able to look after own affairs without assistance, but unable to carry out all previous activities.   3 - Moderate disability. Requires some help, but able to walk unassisted.   4 - Moderately severe disability. Unable to attend to own bodily needs without assistance, and unable to walk unassisted.   5 - Severe disability. Requires constant nursing care and attention, bedridden, incontinent.   6 - Dead.   NIH Stroke Scale = 0   Data reviewed: I personally reviewed the images and agree with the radiology interpretations.  Ct Head Code Stroke W/o Cm 09/27/2016 1. Positive for hyperdense right MCA suspicious for emergent large vessel occlusion. No acute intracranial hemorrhage or changes of acute cortically based infarct are identified. 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 2. ASPECTS is 10.   Ct Angio Head W Or Wo Contrast Ct Angio Neck W Or Wo Contrast 09/27/2016 1. 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 This was relayed via text pager to [facility] on 09/27/2016 at 1230 hours, and later we discussed by telephone. 2. Superimposed right carotid calcified atherosclerosis without hemodynamically significant stenosis. 3. However, there is High-grade (80%, approaching RADIOGRAPHIC STRING SIGN) stenosis at the left ICA origin. 4. Dominant left vertebral artery with up to moderate origin stenosis due to calcified plaque. Non dominant right vertebral artery functionally terminates in PICA.   Cerebral  angiogram 09/27/2016 Status post cerebral angiogram with mechanical thrombectomy of proximal right MCA occlusion and reestablished near-complete, TICI 2b flow to the right hemisphere. Left cerebral angiogram demonstrating adequate perfusion. Left cervical angiogram demonstrates moderate carotid bulb disease, measuring approximately 58% stenosis.   Ct Head Wo Contrast 09/27/2016 1. Post endovascular Neuro-intervention parenchymal staining in the right temporal and parietal lobes. No superimposed cytotoxic edema, acute intracranial hemorrhage or mass effect identified. 2. Small area of chronic encephalomalacia in the right anterior insula / inferior frontal gyrus.   Ct Head Wo Contrast 09/28/2016 Acute RIGHT basal ganglia nonhemorrhagic infarct. Residual RIGHT contrast staining. RIGHT inferior frontal lobe encephalomalacia. Moderate to severe atherosclerosis.   MRI and MRA head  09/28/2016 1. Acute infarctions are present within the right mid caudate body, right posterior lentiform nucleus, intermittently throughout cortex of right insula and temporal operculum, and there are 2 punctate foci in the right parietal cortex. 2. No hemorrhage identified. 3. Background of mild chronic microvascular ischemic changes and parenchymal volume loss of the brain. 4. Widely patent right M1 segment and proximal right M2 segments. There is a long segment of irregularity of the right M2 inferior division in the sylvian fissure with intermittent mild stenosis. 5. 2 mm laterally directed outpouching at the origin of a posterior temporal M3 branch which may represent an occluded branch vessel origin or tiny aneurysm. 6. Short  segment of severe stenosis of the origin of a right parietal M3 branch. 7. Otherwise normal MRA of the head.   2-D echocardiogram -Left ventricle: The cavity size was moderately dilated. Systolicfunction was moderately reduced. The estimated ejection fractionwas in the range of 35% to 40%.  Diffuse hypokinesis worse in theinferior and inferolateral walls. Doppler parameters areconsistent with abnormal left ventricular relaxation (grade 1diastolic dysfunction). Doppler parameters are consistent withindeterminate ventricular filling pressure. - Aortic valve: Transvalvular velocity was within the normal range.There was no stenosis. There was no regurgitation. - Mitral valve: Transvalvular velocity was within the normal range.There was no evidence for stenosis. There was trivialregurgitation. - Left atrium: The atrium was severely dilated. - Right ventricle: The cavity size was normal. Wall thickness wasnormal. Systolic function was normal. - Tricuspid valve: There was no regurgitation.  LE venous Doppler - left popliteal acute vs. Time indeterminate DVT but not chronic.   TCD bubble study - negative for PFO  Component     Latest Ref Rng & Units 09/28/2016 11/14/2016  Cholesterol     0 - 200 mg/dL 107   Triglycerides     <150 mg/dL 85   HDL Cholesterol     >40 mg/dL 32 (L)   Total CHOL/HDL Ratio     RATIO 3.3   VLDL     0 - 40 mg/dL 17   LDL (calc)     0 - 99 mg/dL 58   Hemoglobin A1C     4.8 - 5.6 % 5.7 (H)   Mean Plasma Glucose     mg/dL 117   Vitamin B12     211 - 911 pg/mL  >1500 (H)    Assessment: As you may recall, he is a 61 y.o. Caucasian male with PMH of hypertension, diabetes, hyperlipidemia, PE, CAD s/p CABG x 4,and smoking admitted on 09/27/16 for left body hemiparesis. HereceivedIV t-PA. CT head showed hyperdense right MCA and CTA head and neck showed LVO with right M1 and proximal left ICA 80% stenosis. He was also taken to IR where he received TICI 2b revascularization of R MCA occlusion with mechanical thrombectomy.The left ICA was calculated at 58% stenosis. MRI showed right MCA patchy infarcts with old right inferior frontal lobe encephalomalacia. EF 35-40%. LDL 58 and A1C 5.7. Stroke etiology not sure, large vessel atherosclerosis (carotid  stenosis, CAD s/p CABG) vs.cardioembolic (low EF, undiagnosed afib). However, his LE venous doppler showed left popliteal vein acute/age undetermined DVT, but seems not chronic. TCD bubble study no PFO. Due to recurrent PE/DVT, he was put on long term AC with eliquis. ASA 81mg  continued due to CAD s/p CABG. Zocor continued. During the interval time, the patient has been doing well. Still smoking. Followed with Dr. Estanislado Pandy, repeat CUS showed no significant left ICA stenosis.    Plan:  - continue eliquis and ASA and zocor for stroke and heart prevention - quit smoking - Follow up with your primary care physician for stroke risk factor modification. Recommend maintain blood pressure goal <140/80, diabetes with hemoglobin A1c goal below 7.0% and lipids with LDL cholesterol goal below 70 mg/dL.  - check BP at home  - healthy diet and regular exercise - follow up with Dr. Estanislado Pandy for left carotid stenosis monitoring - follow up in 6 months.   No orders of the defined types were placed in this encounter.   No orders of the defined types were placed in this encounter.   Patient Instructions  - continue eliquis and ASA and zocor  for stroke and heart prevention - quit smoking - Follow up with your primary care physician for stroke risk factor modification. Recommend maintain blood pressure goal <140/80, diabetes with hemoglobin A1c goal below 7.0% and lipids with LDL cholesterol goal below 70 mg/dL.  - check BP at home  - healthy diet and regular exercise - follow up with Dr. Estanislado Pandy for carotid stenosis monitoring - follow up in 6 months.     Rosalin Hawking, MD PhD [facility] [address] [place], [place] [address] [phone]

## 2017-05-19 ENCOUNTER — Telehealth: Payer: Self-pay | Admitting: Family Medicine

## 2017-05-19 NOTE — Telephone Encounter (Signed)
Received this message on Deborra Medina patient.  Eliquis runs out Monday.  Needs assistance or transition to other anticoagulant. Can you touch base with pt see if willing to transition to coumadin or see if he qualifies for pt assistance? Will cc PCP

## 2017-05-19 NOTE — Telephone Encounter (Signed)
Spoke with patient.  He has enough to pay for 1 more month of Eliquis and is interested in [place] helping him apply for the patient assistance program to carry him through the end of the year.  He did not do well and was taken off of coumadin in the past and would like not to have to go back to that if at all possible.    I have spoken with Larene Beach bridges, CMA, who will take over working on this with patient next week while I am out on vacay as she is familiar with this program and what needs to be done.  Patient aware that Larene Beach will call him next week.  Copy of blank application placed on Shannon's desk.  Thanks.

## 2017-05-22 ENCOUNTER — Other Ambulatory Visit: Payer: Self-pay | Admitting: Family Medicine

## 2017-05-22 ENCOUNTER — Other Ambulatory Visit: Payer: Self-pay | Admitting: *Deleted

## 2017-05-22 MED ORDER — ALPRAZOLAM 1 MG PO TABS: ORAL_TABLET | ORAL | 0 refills | Status: DC

## 2017-05-22 NOTE — Telephone Encounter (Signed)
RX request  Alprazolam #[id number] last filled 04/18/17-last office visit-04/18/17-next office visit-07/19/17

## 2017-05-23 NOTE — Telephone Encounter (Signed)
Called in Rx to pharm/thx dmf

## 2017-05-30 NOTE — Telephone Encounter (Signed)
Application filled out and waiting for Dr Deborra Medina to sign. Will fax when ready.

## 2017-06-02 NOTE — Telephone Encounter (Signed)
Application faxed

## 2017-06-07 ENCOUNTER — Other Ambulatory Visit (HOSPITAL_COMMUNITY): Payer: Self-pay | Admitting: Interventional Radiology

## 2017-06-07 ENCOUNTER — Telehealth (HOSPITAL_COMMUNITY): Payer: Self-pay

## 2017-06-07 NOTE — Telephone Encounter (Signed)
Called to schedule 6 month f/u us carotid. Left message for pt to return call. AW 

## 2017-06-08 NOTE — Telephone Encounter (Signed)
Spoke to pt. He was approved for the remainder of the year. Should receive med by 06-16-17. Michela Pitcher we can call [phone] if he does not. Once he spends 3% out of pocket next year, we can do a new application.

## 2017-06-20 ENCOUNTER — Telehealth: Payer: Self-pay

## 2017-06-20 NOTE — Telephone Encounter (Signed)
Spoke to pt. He should receive it in the mail. Gave him the number to call and check on the shipment.

## 2017-06-20 NOTE — Telephone Encounter (Signed)
Copied from [place]. Topic: General - Other >> Jun 20, 2017  8:53 AM Celedonio Savage L wrote: Pt calling to see if he need to pick up ELIQUIS up at the office or the pharmacy

## 2017-06-22 ENCOUNTER — Other Ambulatory Visit: Payer: Self-pay

## 2017-06-22 MED ORDER — ALPRAZOLAM 1 MG PO TABS: ORAL_TABLET | ORAL | 2 refills | Status: DC

## 2017-06-22 NOTE — Telephone Encounter (Signed)
Requesting: Alprazolam Contract: Yes UDS: 6.07.2018 Low-risk next screen 12.07.2018 Last OV: 9.04.2018 Next OV: 12.05.2018 Last Refill: 10.09.2018   Please advise

## 2017-06-23 NOTE — Telephone Encounter (Signed)
Called in to pharm/thx dmf

## 2017-07-19 ENCOUNTER — Ambulatory Visit: Payer: Medicare HMO | Admitting: Family Medicine

## 2017-07-20 ENCOUNTER — Encounter: Payer: Self-pay | Admitting: Family Medicine

## 2017-07-20 ENCOUNTER — Ambulatory Visit: Payer: Medicare HMO | Admitting: Family Medicine

## 2017-07-20 VITALS — BP 120/58 | HR 79 | Temp 98.0°F | Ht 72.0 in | Wt 170.8 lb

## 2017-07-20 DIAGNOSIS — F1120 Opioid dependence, uncomplicated: Secondary | ICD-10-CM | POA: Insufficient documentation

## 2017-07-20 DIAGNOSIS — R52 Pain, unspecified: Secondary | ICD-10-CM

## 2017-07-20 DIAGNOSIS — G894 Chronic pain syndrome: Secondary | ICD-10-CM

## 2017-07-20 MED ORDER — HYDROCODONE-ACETAMINOPHEN 10-325 MG PO TABS: 1.0000 | ORAL_TABLET | ORAL | 0 refills | Status: DC | PRN

## 2017-07-20 NOTE — Patient Instructions (Signed)
Great to see you.  Happy birthday!!  [name]!

## 2017-07-20 NOTE — Progress Notes (Signed)
Subjective:   Patient ID: [name], male    DOB: 06/12/1956, 61 y.o.   MRN: [id number]  [name] is a pleasant 61 y.o. year old male who presents to clinic today with Follow-up (Patient is here today for a 3 month F/U.  He has had coffee with creamer and splenda this am. He is requesting a refill of Hydrocodone.  )  on 07/20/2017  HPI:  Chronic pain- back, hip, knee.  Feels narcotics take the edge off.  Indication for chronic opioid: DDD/DJD Medication and dose Norco 10/325- 1 tab every 4 hours as needed for pain # pills per month: 150 Last UDS date: 09/28/16 Pain contract signed (Y/N): Y Date narcotic database last reviewed (include red flags): 07/20/17   Diabetes- has been diet controlled.  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  He has also been compliant with Zocor 80 mg daily.  Lab Results  Component Value Date   CHOL 107 09/28/2016   HDL 32 (L) 09/28/2016   LDLCALC 58 09/28/2016   LDLDIRECT 54.0 02/10/2015   TRIG 85 09/28/2016   CHOLHDL 3.3 09/28/2016   Lab Results  Component Value Date   ALT 11 02/09/2016   AST 25 02/09/2016   ALKPHOS 47 02/09/2016   BILITOT 0.4 02/09/2016   Current Outpatient Medications on File Prior to Visit  Medication Sig Dispense Refill  . ALPRAZolam (XANAX) 1 MG tablet TAKE ONE TABLET BY MOUTH THREE TIMES DAILY AS NEEDED FOR ANXIETY 90 tablet 2  . apixaban (ELIQUIS) 5 MG TABS tablet TAKE (2) TABLETS BY MOUTH TWICE DAILY. 60 tablet 0  . aspirin 81 MG tablet Take 81 mg by mouth daily.      . Calcium Citrate-Vitamin D 500-400 MG-UNIT CHEW Chew 1 tablet by mouth 2 (two) times daily.      . Cholecalciferol (VITAMIN D) 2000 UNITS tablet Take 5,000 Units by mouth daily.     . cyanocobalamin 500 MCG tablet Take 5,000 mcg by mouth daily.     . fexofenadine (ALLEGRA) 180 MG tablet Take 180 mg by mouth daily  as needed.    . Multiple Vitamin (MULTIVITAMIN) tablet Take 1 tablet by mouth daily.      . nitroGLYCERIN (NITROSTAT) 0.3 MG SL tablet Place 1 tablet (0.3 mg total) under the tongue every 5 (five) minutes as needed. 90 tablet 3  . omeprazole (PRILOSEC) 20 MG capsule Take 20 mg by mouth daily.    . polyethylene glycol powder (GLYCOLAX/MIRALAX) powder Take 1 Container by mouth once. 1/2 SCOP DAILY    . simvastatin (ZOCOR) 80 MG tablet TAKE ONE TABLET BY MOUTH AT BEDTIME. 90 tablet 1   No current facility-administered medications on file prior to visit.     Allergies  Allergen Reactions  . Codeine     REACTION: makes me "looney"    Past Medical History:  Diagnosis Date  . Anemia   . Arthritis   . CAD (coronary artery disease)   . Diabetes mellitus   . Hyperlipidemia   . Hypertension   . Left ventricular dysfunction   . Low back pain   . MI (myocardial infarction) ([place])   . Obesity   . PE (pulmonary embolism)   . Stroke ([place])   . Tobacco abuse     Past Surgical History:  Procedure Laterality Date  . CHOLECYSTECTOMY    .  CORONARY ARTERY BYPASS GRAFT    . CORONARY STENT PLACEMENT     3 all in right side  . GASTRIC BYPASS    . HERNIA REPAIR    . IR GENERIC HISTORICAL  09/27/2016   IR ANGIO INTRA EXTRACRAN SEL COM CAROTID INNOMINATE UNI L MOD SED 09/27/2016 Luanne Bras, MD [facility]-INTERV RAD  . IR GENERIC HISTORICAL  09/27/2016   IR PERCUTANEOUS ART THROMBECTOMY/INFUSION INTRACRANIAL INC DIAG ANGIO 09/27/2016 Luanne Bras, MD [facility]INTER[facility]  . IR GENERIC HISTORICAL  11/07/2016   IR RADIOLOGIST EVAL & MGMT 11/07/2016 [facility]  . RADIOLOGY WITH ANESTHESIA N/A 09/27/2016   Procedure: RADIOLOGY WITH ANESTHESIA;  Surgeon: Luanne Bras, MD;  Location: [facility];  Service: Radiology;  Laterality: N/A;    Family History  Problem Relation Age of Onset  . Emphysema Mother   . Heart attack Father   . Heart disease Daughter   . Heart disease Brother   . Arthritis Brother   .  Hemophilia Brother   . Colon cancer Neg Hx     Social History   Socioeconomic History  . Marital status: Widowed    Spouse name: Not on file  . Number of children: Not on file  . Years of education: Not on file  . Highest education level: Not on file  Social Needs  . Financial resource strain: Not on file  . Food insecurity - worry: Not on file  . Food insecurity - inability: Not on file  . Transportation needs - medical: Not on file  . Transportation needs - non-medical: Not on file  Occupational History  . Not on file  Tobacco Use  . Smoking status: Current Every Day Smoker    Packs/day: 1.00    Years: 40.00    Pack years: 40.00    Types: Cigarettes  . Smokeless tobacco: Former Network engineer and Sexual Activity  . Alcohol use: No  . Drug use: No  . Sexual activity: Yes  Other Topics Concern  . Not on file  Social History Narrative   Would desire CPR.   Does not desire long term life support or feeding tubes.         The PMH, PSH, Social History, Family History, Medications, and allergies have been reviewed in [facility], and have been updated if relevant.    Review of Systems  HENT: Negative.   Eyes: Negative.   Respiratory: Negative.   Cardiovascular: Negative.   Gastrointestinal: Negative.   Endocrine: Negative.   Genitourinary: Negative.   Musculoskeletal: Negative.   Allergic/Immunologic: Negative.   Neurological: Negative.   Hematological: Negative.   Psychiatric/Behavioral: Negative.   All other systems reviewed and are negative.      Objective:    BP (!) 120/58 (BP Location: Left Arm, Patient Position: Sitting, Cuff Size: Normal)   Pulse 79   Temp 98 F (36.7 C) (Oral)   Ht 6' (1.829 m)   Wt 170 lb 12.8 oz (77.5 kg)   SpO2 100%   BMI 23.16 kg/m    Physical Exam  Constitutional: He is oriented to person, place, and time. He appears well-developed and well-nourished. No distress.  HENT:  Head: Normocephalic and atraumatic.  Eyes:  Conjunctivae are normal.  Cardiovascular: Normal rate.  Pulmonary/Chest: Effort normal.  Musculoskeletal: Normal range of motion. He exhibits no edema.  Neurological: He is alert and oriented to person, place, and time. No cranial nerve deficit.  Skin: Skin is warm and dry. He is not diaphoretic.  Psychiatric: He  has a normal mood and affect. His behavior is normal. Judgment and thought content normal.  Nursing note and vitals reviewed.         Assessment & Plan:   Chronic pain syndrome  Pain management No Follow-up on file.

## 2017-07-24 ENCOUNTER — Ambulatory Visit: Payer: Medicare HMO | Admitting: Family Medicine

## 2017-07-26 NOTE — Assessment & Plan Note (Signed)
Has followed pain contract, UDS up to date. Rxs printed and given to pt. See HPI

## 2017-07-28 ENCOUNTER — Telehealth (HOSPITAL_COMMUNITY): Payer: Self-pay

## 2017-07-28 NOTE — Telephone Encounter (Signed)
Called to schedule f/u us carotid, left message for pt to return call. AW 

## 2017-09-20 ENCOUNTER — Telehealth (HOSPITAL_COMMUNITY): Payer: Self-pay

## 2017-09-20 NOTE — Telephone Encounter (Signed)
Called to schedule f/u US carotid, no answer, left message. AW

## 2017-09-21 ENCOUNTER — Other Ambulatory Visit: Payer: Self-pay | Admitting: Family Medicine

## 2017-09-21 ENCOUNTER — Other Ambulatory Visit: Payer: Self-pay

## 2017-09-21 MED ORDER — ALPRAZOLAM 1 MG PO TABS: ORAL_TABLET | ORAL | 2 refills | Status: DC

## 2017-09-22 ENCOUNTER — Other Ambulatory Visit: Payer: Self-pay

## 2017-09-22 MED ORDER — APIXABAN 5 MG PO TABS: ORAL_TABLET | ORAL | 2 refills | Status: DC

## 2017-10-03 ENCOUNTER — Other Ambulatory Visit: Payer: Self-pay

## 2017-10-03 ENCOUNTER — Telehealth: Payer: Self-pay | Admitting: Family Medicine

## 2017-10-03 MED ORDER — APIXABAN 5 MG PO TABS: ORAL_TABLET | ORAL | 2 refills | Status: DC

## 2017-10-03 NOTE — Telephone Encounter (Signed)
Copied from [place] [phone]. Topic: Quick Communication - See Telephone Encounter >> Oct 03, 2017  1:36 PM Aurelio Brash B wrote: CRM for notification. See Telephone encounter for:  Quita Skye from [facility] called for clarification on the pts  apixaban (ELIQUIS) 5 MG TABS tablet.  He needs clarification on quantity and on the pts  instructions. [place] contact number is [phone] 10/03/17.

## 2017-10-03 NOTE — Telephone Encounter (Signed)
Corrected qty on Rx to reflect what pt is taking and resent to pharm with note to deactivate previous Rx/thx dmf

## 2017-10-17 ENCOUNTER — Ambulatory Visit: Payer: Medicare HMO | Admitting: Nurse Practitioner

## 2017-10-18 ENCOUNTER — Other Ambulatory Visit: Payer: Self-pay

## 2017-10-18 ENCOUNTER — Ambulatory Visit: Payer: Medicare HMO | Admitting: Behavioral Health

## 2017-10-18 ENCOUNTER — Ambulatory Visit: Payer: Medicare HMO | Admitting: Family Medicine

## 2017-10-18 ENCOUNTER — Other Ambulatory Visit: Payer: Self-pay | Admitting: Family Medicine

## 2017-10-18 DIAGNOSIS — Z125 Encounter for screening for malignant neoplasm of prostate: Secondary | ICD-10-CM

## 2017-10-18 DIAGNOSIS — E119 Type 2 diabetes mellitus without complications: Secondary | ICD-10-CM

## 2017-10-18 DIAGNOSIS — E785 Hyperlipidemia, unspecified: Secondary | ICD-10-CM

## 2017-10-18 MED ORDER — NITROGLYCERIN 0.3 MG SL SUBL: 0.3000 mg | SUBLINGUAL_TABLET | SUBLINGUAL | 3 refills | Status: DC | PRN

## 2017-10-18 MED ORDER — HYDROCODONE-ACETAMINOPHEN 10-325 MG PO TABS: 1.0000 | ORAL_TABLET | ORAL | 0 refills | Status: DC | PRN

## 2017-10-18 MED ORDER — APIXABAN 5 MG PO TABS: 5.0000 mg | ORAL_TABLET | Freq: Two times a day (BID) | ORAL | 2 refills | Status: DC

## 2017-10-18 NOTE — Telephone Encounter (Signed)
Printed Rx for March & April for TA to sign/thx dmf

## 2017-10-18 NOTE — Progress Notes (Unsigned)
Subjective:   Patient ID: [name], male    DOB: [birth year], 62 y.o.   MRN: [id number]  [name] is a pleasant 62 y.o. year old male who presents to clinic today with No chief complaint on file.  on 10/18/2017  HPI:    Current Outpatient Medications on File Prior to Visit  Medication Sig Dispense Refill  . ALPRAZolam (XANAX) 1 MG tablet TAKE ONE TABLET BY MOUTH THREE TIMES DAILY AS NEEDED FOR ANXIETY 90 tablet 2  . apixaban (ELIQUIS) 5 MG TABS tablet TAKE (2) TABLETS BY MOUTH TWICE DAILY. 120 tablet 2  . aspirin 81 MG tablet Take 81 mg by mouth daily.      . Calcium Citrate-Vitamin D 500-400 MG-UNIT CHEW Chew 1 tablet by mouth 2 (two) times daily.      . Cholecalciferol (VITAMIN D) 2000 UNITS tablet Take 5,000 Units by mouth daily.     . cyanocobalamin 500 MCG tablet Take 5,000 mcg by mouth daily.     . fexofenadine (ALLEGRA) 180 MG tablet Take 180 mg by mouth daily as needed.    . Multiple Vitamin (MULTIVITAMIN) tablet Take 1 tablet by mouth daily.      . nitroGLYCERIN (NITROSTAT) 0.3 MG SL tablet Place 1 tablet (0.3 mg total) under the tongue every 5 (five) minutes as needed. 90 tablet 3  . omeprazole (PRILOSEC) 20 MG capsule Take 20 mg by mouth daily.    . polyethylene glycol powder (GLYCOLAX/MIRALAX) powder Take 1 Container by mouth once. 1/2 SCOP DAILY    . simvastatin (ZOCOR) 80 MG tablet TAKE ONE TABLET BY MOUTH AT BEDTIME. 90 tablet 1   No current facility-administered medications on file prior to visit.     Allergies  Allergen Reactions  . Codeine     REACTION: makes me "looney"    Past Medical History:  Diagnosis Date  . Anemia   . Arthritis   . CAD (coronary artery disease)   . Diabetes mellitus   . Hyperlipidemia   . Hypertension   . Left ventricular dysfunction   . Low back pain   . MI (myocardial infarction) ([place])   . Obesity   . PE (pulmonary embolism)   . Stroke ([place])   . Tobacco abuse     Past Surgical History:  Procedure Laterality  Date  . CHOLECYSTECTOMY    . CORONARY ARTERY BYPASS GRAFT    . CORONARY STENT PLACEMENT     3 all in right side  . GASTRIC BYPASS    . HERNIA REPAIR    . IR GENERIC HISTORICAL  09/27/2016   IR ANGIO INTRA EXTRACRAN SEL COM CAROTID INNOMINATE UNI L MOD SED 09/27/2016 Luanne Bras, MD [facility]  . IR GENERIC HISTORICAL  09/27/2016   IR PERCUTANEOUS ART THROMBECTOMY/INFUSION INTRACRANIAL INC DIAG ANGIO 09/27/2016 Luanne Bras, MD [facility]  . IR GENERIC HISTORICAL  11/07/2016   IR RADIOLOGIST EVAL & MGMT 11/07/2016 [facility]  . RADIOLOGY WITH ANESTHESIA N/A 09/27/2016   Procedure: RADIOLOGY WITH ANESTHESIA;  Surgeon: Luanne Bras, MD;  Location: [place];  Service: Radiology;  Laterality: N/A;    Family History  Problem Relation Age of Onset  . Emphysema Mother   . Heart attack Father   . Heart disease Daughter   . Heart disease Brother   . Arthritis Brother   . Hemophilia Brother   . Colon cancer Neg Hx     Social History   Socioeconomic History  . Marital status: Widowed  Spouse name: Not on file  . Number of children: Not on file  . Years of education: Not on file  . Highest education level: Not on file  Social Needs  . Financial resource strain: Not on file  . Food insecurity - worry: Not on file  . Food insecurity - inability: Not on file  . Transportation needs - medical: Not on file  . Transportation needs - non-medical: Not on file  Occupational History  . Not on file  Tobacco Use  . Smoking status: Current Every Day Smoker    Packs/day: 1.00    Years: 40.00    Pack years: 40.00    Types: Cigarettes  . Smokeless tobacco: Former Network engineer and Sexual Activity  . Alcohol use: No  . Drug use: No  . Sexual activity: Yes  Other Topics Concern  . Not on file  Social History Narrative   Would desire CPR.   Does not desire long term life support or feeding tubes.         The PMH, PSH, Social History, Family History, Medications, and  allergies have been reviewed in [facility], and have been updated if relevant.   Review of Systems     Objective:    There were no vitals taken for this visit.   Physical Exam        Assessment & Plan:   No diagnosis found. No Follow-up on file.

## 2017-11-17 NOTE — Progress Notes (Signed)
Subjective:   [name] is a 62 y.o. male who presents for Medicare Annual/Subsequent preventive examination.  Review of Systems: No ROS.  Medicare Wellness Visit. Additional risk factors are reflected in the social history.  Cardiac Risk Factors include: advanced age (>39men, >16 women);diabetes mellitus;dyslipidemia;hypertension;male gender;smoking/ tobacco exposure Sleep patterns: Takes xanax. Sleeps about 8 hrs. Home Safety/Smoke Alarms: Feels safe in home. Smoke alarms in place.  Living environment; residence and Firearm Safety: No issues with stairs. Lives alone.  Seat Belt Safety/Bike Helmet: Wears seat belt.  Male:   CCS- last 06/02/15.recall 5 yrs    PSA-  Lab Results  Component Value Date   PSA 0.59 11/11/2014   PSA 0.45 10/31/2013   PSA 0.67 10/31/2012       Objective:    Vitals: BP 136/70 (BP Location: Left Arm, Patient Position: Sitting, Cuff Size: Normal)   Pulse (!) 50   Ht 6' (1.829 m)   Wt 173 lb 6.4 oz (78.7 kg)   SpO2 97%   BMI 23.52 kg/m   Body mass index is 23.52 kg/m.  Wt Readings from Last 3 Encounters:  11/22/17 173 lb 6.4 oz (78.7 kg)  07/20/17 170 lb 12.8 oz (77.5 kg)  04/19/17 175 lb 6.4 oz (79.6 kg)   Temp Readings from Last 3 Encounters:  07/20/17 98 F (36.7 C) (Oral)  04/18/17 98 F (36.7 C) (Oral)  01/03/17 97.7 F (36.5 C)   BP Readings from Last 3 Encounters:  11/22/17 136/70  07/20/17 (!) 120/58  04/19/17 (!) 101/58   Pulse Readings from Last 3 Encounters:  11/22/17 (!) 50  07/20/17 79  04/19/17 71   **PCP made aware of HR. Will address in visit today**  Advanced Directives 11/22/2017 11/12/2015 05/19/2015 12/24/2014  Does Patient Have a Medical Advance Directive? No No No No  Would patient like information on creating a medical advance directive? No - Patient declined Yes - Educational materials given No - patient declined information No - patient declined information    Tobacco Social History   Tobacco Use    Smoking Status Current Every Day Smoker  . Packs/day: 1.00  . Years: 40.00  . Pack years: 40.00  . Types: Cigarettes  Smokeless Tobacco Former Systems developer     Ready to quit: Yes Counseling given: Yes   Clinical Intake: Pain : No/denies pain    Past Medical History:  Diagnosis Date  . Anemia   . Arthritis   . CAD (coronary artery disease)   . Diabetes mellitus   . Hyperlipidemia   . Hypertension   . Left ventricular dysfunction   . Low back pain   . MI (myocardial infarction) ([place])   . Obesity   . PE (pulmonary embolism)   . Stroke ([place])   . Tobacco abuse    Past Surgical History:  Procedure Laterality Date  . CHOLECYSTECTOMY    . CORONARY ARTERY BYPASS GRAFT    . CORONARY STENT PLACEMENT     3 all in right side  . GASTRIC BYPASS    . HERNIA REPAIR    . IR GENERIC HISTORICAL  09/27/2016   IR ANGIO INTRA EXTRACRAN SEL COM CAROTID INNOMINATE UNI L MOD SED 09/27/2016 Luanne Bras, MD [facility]  . IR GENERIC HISTORICAL  09/27/2016   IR PERCUTANEOUS ART THROMBECTOMY/INFUSION INTRACRANIAL INC DIAG ANGIO 09/27/2016 Luanne Bras, MD [facility]  . IR GENERIC HISTORICAL  11/07/2016   IR RADIOLOGIST EVAL & MGMT 11/07/2016 [facility]  . RADIOLOGY WITH ANESTHESIA  N/A 09/27/2016   Procedure: RADIOLOGY WITH ANESTHESIA;  Surgeon: Luanne Bras, MD;  Location: [place];  Service: Radiology;  Laterality: N/A;   Family History  Problem Relation Age of Onset  . Emphysema Mother   . Heart attack Father   . Heart disease Daughter   . Heart disease Brother   . Arthritis Brother   . Hemophilia Brother   . Colon cancer Neg Hx    Social History   Socioeconomic History  . Marital status: Widowed    Spouse name: Not on file  . Number of children: Not on file  . Years of education: Not on file  . Highest education level: Not on file  Occupational History  . Not on file  Social Needs  . Financial resource strain: Not on file  . Food insecurity:    Worry: Not on file     Inability: Not on file  . Transportation needs:    Medical: Not on file    Non-medical: Not on file  Tobacco Use  . Smoking status: Current Every Day Smoker    Packs/day: 1.00    Years: 40.00    Pack years: 40.00    Types: Cigarettes  . Smokeless tobacco: Former Network engineer and Sexual Activity  . Alcohol use: No  . Drug use: Yes    Types: Marijuana  . Sexual activity: Yes  Lifestyle  . Physical activity:    Days per week: Not on file    Minutes per session: Not on file  . Stress: Not on file  Relationships  . Social connections:    Talks on phone: Not on file    Gets together: Not on file    Attends religious service: Not on file    Active member of club or organization: Not on file    Attends meetings of clubs or organizations: Not on file    Relationship status: Not on file  Other Topics Concern  . Not on file  Social History Narrative   Would desire CPR.   Does not desire long term life support or feeding tubes.          Outpatient Encounter Medications as of 11/22/2017  Medication Sig  . ALPRAZolam (XANAX) 1 MG tablet TAKE ONE TABLET BY MOUTH THREE TIMES DAILY AS NEEDED FOR ANXIETY  . apixaban (ELIQUIS) 5 MG TABS tablet Take 1 tablet (5 mg total) by mouth 2 (two) times daily.  Marland Kitchen aspirin 81 MG tablet Take 81 mg by mouth daily.    . Calcium Citrate-Vitamin D 500-400 MG-UNIT CHEW Chew 1 tablet by mouth 2 (two) times daily.    . Cholecalciferol (VITAMIN D) 2000 UNITS tablet Take 5,000 Units by mouth daily.   . cyanocobalamin 500 MCG tablet Take 5,000 mcg by mouth daily.   . fexofenadine (ALLEGRA) 180 MG tablet Take 180 mg by mouth daily as needed.  Marland Kitchen HYDROcodone-acetaminophen (NORCO) 10-325 MG tablet Take 1 tablet by mouth every 4 (four) hours as needed for severe pain.  . Multiple Vitamin (MULTIVITAMIN) tablet Take 1 tablet by mouth daily.    . nitroGLYCERIN (NITROSTAT) 0.3 MG SL tablet Place 1 tablet (0.3 mg total) under the tongue every 5 (five) minutes as  needed.  Marland Kitchen omeprazole (PRILOSEC) 20 MG capsule Take 20 mg by mouth daily.  . polyethylene glycol powder (GLYCOLAX/MIRALAX) powder Take 1 Container by mouth once. 1/2 SCOP DAILY  . simvastatin (ZOCOR) 80 MG tablet TAKE ONE TABLET BY MOUTH AT BEDTIME.   No facility-administered  encounter medications on file as of 11/22/2017.     Activities of Daily Living In your present state of health, do you have any difficulty performing the following activities: 11/22/2017 07/20/2017  Hearing? N N  Vision? N N  Comment Wears glasses Last eye exam 09/2017 -  Difficulty concentrating or making decisions? N N  Walking or climbing stairs? N N  Dressing or bathing? N N  Doing errands, shopping? N N  Preparing Food and eating ? N -  Using the Toilet? N -  In the past six months, have you accidently leaked urine? N -  Do you have problems with loss of bowel control? N -  Managing your Medications? N -  Managing your Finances? N -  Housekeeping or managing your Housekeeping? N -  Some recent data might be hidden    Patient Care Team: Lucille Passy, MD as PCP - Cyndia Diver, MD as Consulting Physician (Cardiology) Luanne Bras, MD as Consulting Physician (Interventional Radiology)   Assessment:   This is a routine wellness examination for [place]. Physical assessment deferred to PCP.   Exercise Activities and Dietary recommendations Current Exercise Habits: The patient does not participate in regular exercise at present, Exercise limited by: None identified Diet (meal preparation, eat out, water intake, caffeinated beverages, dairy products, fruits and vegetables): 24 hour recall Breakfast: fast food biscuits or burritos Lunch: [place] sandwich or ham Dinner:   [employer]ers frozen dinner  Goals    . DIET - EAT MORE FRUITS AND VEGETABLES    . Quit Smoking       Fall Risk Fall Risk  11/22/2017 11/14/2016 11/12/2015 11/11/2014 11/06/2013  Falls in the past year? No No No No No      Depression Screen PHQ 2/9 Scores 11/22/2017 11/14/2016 11/14/2016 11/12/2015  PHQ - 2 Score 0 0 0 0  PHQ- 9 Score - 0 0 -    Cognitive Function MMSE - Mini Mental State Exam 11/22/2017 11/12/2015  Orientation to time 5 5  Orientation to Place 5 5  Registration 3 3  Attention/ Calculation 5 0  Recall 2 3  Language- name 2 objects 2 0  Language- repeat 1 1  Language- follow 3 step command 3 3  Language- read & follow direction 1 0  Write a sentence 1 0  Copy design 1 0  Total score 29 20        Immunization History  Administered Date(s) Administered  . Influenza Split 05/04/2011, 04/30/2012  . Influenza Whole 05/15/2006  . Influenza, Quadrivalent, Recombinant, Inj, Pf 06/14/2017  . Influenza,inj,Quad PF,6+ Mos 04/29/2013, 06/05/2014, 05/12/2015  . Pneumococcal Polysaccharide-23 05/15/2004, 10/31/2012  . Td 07/23/2008    Screening Tests Health Maintenance  Topic Date Due  . URINE MICROALBUMIN  05/11/2016  . OPHTHALMOLOGY EXAM  08/29/2016  . HEMOGLOBIN A1C  03/28/2017  . FOOT EXAM  11/14/2017  . INFLUENZA VACCINE  03/15/2018  . TETANUS/TDAP  07/23/2018  . COLONOSCOPY  05/27/2020  . PNEUMOCOCCAL POLYSACCHARIDE VACCINE  Completed  . Hepatitis C Screening  Completed  . HIV Screening  Completed       Plan:   Follow up with PCP today as scheduled.  Continue to eat heart healthy diet (full of fruits, vegetables, whole grains, lean protein, water--limit salt, fat, and sugar intake) and increase physical activity as tolerated.  Continue doing brain stimulating activities (puzzles, reading, adult coloring books, staying active) to keep memory sharp.   Consider stopping smoking.    I have personally reviewed  and noted the following in the patient's chart:   . Medical and social history . Use of alcohol, tobacco or illicit drugs  . Current medications and supplements . Functional ability and status . Nutritional status . Physical activity . Advanced  directives . List of other physicians . Hospitalizations, surgeries, and ER visits in previous 12 months . Vitals . Screenings to include cognitive, depression, and falls . Referrals and appointments  In addition, I have reviewed and discussed with patient certain preventive protocols, quality metrics, and best practice recommendations. A written personalized care plan for preventive services as well as general preventive health recommendations were provided to patient.     Shela Nevin, [place]  11/22/2017

## 2017-11-20 ENCOUNTER — Encounter: Payer: Medicare HMO | Admitting: Family Medicine

## 2017-11-21 ENCOUNTER — Telehealth: Payer: Self-pay

## 2017-11-21 NOTE — Telephone Encounter (Signed)
I called pt to offer him a sooner appt with Janett Billow, NP since he is on the wait list. No answer, left a message asking him to call me back. If pt calls back, please discuss with him and schedule as appropriate.

## 2017-11-22 ENCOUNTER — Ambulatory Visit (INDEPENDENT_AMBULATORY_CARE_PROVIDER_SITE_OTHER): Payer: Medicare HMO | Admitting: Family Medicine

## 2017-11-22 ENCOUNTER — Ambulatory Visit (INDEPENDENT_AMBULATORY_CARE_PROVIDER_SITE_OTHER): Payer: Medicare HMO | Admitting: Behavioral Health

## 2017-11-22 ENCOUNTER — Encounter: Payer: Self-pay | Admitting: Behavioral Health

## 2017-11-22 ENCOUNTER — Encounter: Payer: Self-pay | Admitting: Family Medicine

## 2017-11-22 VITALS — BP 136/70 | HR 50 | Ht 72.0 in | Wt 173.4 lb

## 2017-11-22 VITALS — BP 122/62 | HR 54 | Temp 98.4°F | Ht 70.0 in | Wt 173.4 lb

## 2017-11-22 DIAGNOSIS — E785 Hyperlipidemia, unspecified: Secondary | ICD-10-CM | POA: Diagnosis not present

## 2017-11-22 DIAGNOSIS — E119 Type 2 diabetes mellitus without complications: Secondary | ICD-10-CM

## 2017-11-22 DIAGNOSIS — Z0000 Encounter for general adult medical examination without abnormal findings: Secondary | ICD-10-CM | POA: Diagnosis not present

## 2017-11-22 DIAGNOSIS — Z86718 Personal history of other venous thrombosis and embolism: Secondary | ICD-10-CM | POA: Diagnosis not present

## 2017-11-22 DIAGNOSIS — Z716 Tobacco abuse counseling: Secondary | ICD-10-CM

## 2017-11-22 DIAGNOSIS — I2510 Atherosclerotic heart disease of native coronary artery without angina pectoris: Secondary | ICD-10-CM | POA: Diagnosis not present

## 2017-11-22 DIAGNOSIS — F17200 Nicotine dependence, unspecified, uncomplicated: Secondary | ICD-10-CM | POA: Diagnosis not present

## 2017-11-22 LAB — POCT UA - MICROALBUMIN: Microalbumin Ur, POC: 10 mg/L

## 2017-11-22 LAB — POCT GLYCOSYLATED HEMOGLOBIN (HGB A1C): Hemoglobin A1C: 5.8

## 2017-11-22 MED ORDER — HYDROCODONE-ACETAMINOPHEN 10-325 MG PO TABS: 1.0000 | ORAL_TABLET | ORAL | 0 refills | Status: DC | PRN

## 2017-11-22 MED ORDER — NICOTINE 14 MG/24HR TD PT24: 14.0000 mg | MEDICATED_PATCH | Freq: Every day | TRANSDERMAL | 0 refills | Status: DC

## 2017-11-22 MED ORDER — SIMVASTATIN 80 MG PO TABS: 80.0000 mg | ORAL_TABLET | Freq: Every day | ORAL | 1 refills | Status: DC

## 2017-11-22 NOTE — Assessment & Plan Note (Signed)
Ready to quit smoking. eRx sent for nicotine patches. Smoking cessation instruction/counseling given:  counseled patient on the dangers of tobacco use, advised patient to stop smoking, and reviewed strategies to maximize success

## 2017-11-22 NOTE — Assessment & Plan Note (Signed)
Well controlled. No changes made to rxs. 

## 2017-11-22 NOTE — Assessment & Plan Note (Signed)
On lifelong anticoagulation.  

## 2017-11-22 NOTE — Progress Notes (Signed)
Subjective:   Patient ID: [name], male    DOB: 14-Jul-1956, 62 y.o.   MRN: [id number]  [name] is a pleasant 62 y.o. year old male who presents to clinic today with Follow-up (Patient is here today to F/U after AWV.  He had coffee with equal and creamer this am.  He is requesting a prescription for nicotine patches.  He is needing refill for his pain medication for May and June.)  and follow up of chronic medical conditions on 11/22/2017  HPI:  Annual medicare wellness visit with Glenard Haring this morning.  Health Maintenance  Topic Date Due  . FOOT EXAM  11/14/2017  . INFLUENZA VACCINE  03/15/2018  . HEMOGLOBIN A1C  05/24/2018  . TETANUS/TDAP  07/23/2018  . OPHTHALMOLOGY EXAM  10/23/2018  . URINE MICROALBUMIN  11/23/2018  . COLONOSCOPY  05/27/2020  . PNEUMOCOCCAL POLYSACCHARIDE VACCINE  Completed  . Hepatitis C Screening  Completed  . HIV Screening  Completed    Diabetes- diet controlled. 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  He has also been compliant with Zocor 80 mg daily.  Lab Results  Component Value Date   CHOL 107 09/28/2016   HDL 32 (L) 09/28/2016   LDLCALC 58 09/28/2016   LDLDIRECT 54.0 02/10/2015   TRIG 85 09/28/2016   CHOLHDL 3.3 09/28/2016   Lab Results  Component Value Date   ALT 11 02/09/2016   AST 25 02/09/2016   ALKPHOS 47 02/09/2016   BILITOT 0.4 02/09/2016   Current Outpatient Medications on File Prior to Visit  Medication Sig Dispense Refill  . ALPRAZolam (XANAX) 1 MG tablet TAKE ONE TABLET BY MOUTH THREE TIMES DAILY AS NEEDED FOR ANXIETY 90 tablet 2  . apixaban (ELIQUIS) 5 MG TABS tablet Take 1 tablet (5 mg total) by mouth 2 (two) times daily. 60 tablet 2  . aspirin 81 MG tablet Take 81 mg by mouth daily.      . Calcium Citrate-Vitamin D 500-400 MG-UNIT CHEW Chew 1 tablet by mouth 2 (two) times daily.      .  Cholecalciferol (VITAMIN D) 2000 UNITS tablet Take 5,000 Units by mouth daily.     . cyanocobalamin 500 MCG tablet Take 5,000 mcg by mouth daily.     . fexofenadine (ALLEGRA) 180 MG tablet Take 180 mg by mouth daily as needed.    . Multiple Vitamin (MULTIVITAMIN) tablet Take 1 tablet by mouth daily.      . nitroGLYCERIN (NITROSTAT) 0.3 MG SL tablet Place 1 tablet (0.3 mg total) under the tongue every 5 (five) minutes as needed. 90 tablet 3  . omeprazole (PRILOSEC) 20 MG capsule Take 20 mg by mouth daily.    . polyethylene glycol powder (GLYCOLAX/MIRALAX) powder Take 1 Container by mouth once. 1/2 SCOP DAILY    . simvastatin (ZOCOR) 80 MG tablet TAKE ONE TABLET BY MOUTH AT BEDTIME. 90 tablet 1   No current facility-administered medications on file prior to visit.     Allergies  Allergen Reactions  . Codeine     REACTION: makes me "looney"    Past Medical History:  Diagnosis Date  . Anemia   . Arthritis   . CAD (coronary artery disease)   . Diabetes mellitus   . Hyperlipidemia   . Hypertension   . Left ventricular dysfunction   . Low back pain   .  MI (myocardial infarction) ([place])   . Obesity   . PE (pulmonary embolism)   . Stroke ([place])   . Tobacco abuse     Past Surgical History:  Procedure Laterality Date  . CHOLECYSTECTOMY    . CORONARY ARTERY BYPASS GRAFT    . CORONARY STENT PLACEMENT     3 all in right side  . GASTRIC BYPASS    . HERNIA REPAIR    . IR GENERIC HISTORICAL  09/27/2016   IR ANGIO INTRA EXTRACRAN SEL COM CAROTID INNOMINATE UNI L MOD SED 09/27/2016 Luanne Bras, MD [facility]  . IR GENERIC HISTORICAL  09/27/2016   IR PERCUTANEOUS ART THROMBECTOMY/INFUSION INTRACRANIAL INC DIAG ANGIO 09/27/2016 Luanne Bras, MD [facility]  . IR GENERIC HISTORICAL  11/07/2016   IR RADIOLOGIST EVAL & MGMT 11/07/2016 [facility]  . RADIOLOGY WITH ANESTHESIA N/A 09/27/2016   Procedure: RADIOLOGY WITH ANESTHESIA;  Surgeon: Luanne Bras, MD;  Location: [place];   Service: Radiology;  Laterality: N/A;    Family History  Problem Relation Age of Onset  . Emphysema Mother   . Heart attack Father   . Heart disease Daughter   . Heart disease Brother   . Arthritis Brother   . Hemophilia Brother   . Colon cancer Neg Hx     Social History   Socioeconomic History  . Marital status: Widowed    Spouse name: Not on file  . Number of children: Not on file  . Years of education: Not on file  . Highest education level: Not on file  Occupational History  . Not on file  Social Needs  . Financial resource strain: Not on file  . Food insecurity:    Worry: Not on file    Inability: Not on file  . Transportation needs:    Medical: Not on file    Non-medical: Not on file  Tobacco Use  . Smoking status: Current Every Day Smoker    Packs/day: 1.00    Years: 40.00    Pack years: 40.00    Types: Cigarettes  . Smokeless tobacco: Former Network engineer and Sexual Activity  . Alcohol use: No  . Drug use: Yes    Types: Marijuana  . Sexual activity: Yes  Lifestyle  . Physical activity:    Days per week: Not on file    Minutes per session: Not on file  . Stress: Not on file  Relationships  . Social connections:    Talks on phone: Not on file    Gets together: Not on file    Attends religious service: Not on file    Active member of club or organization: Not on file    Attends meetings of clubs or organizations: Not on file    Relationship status: Not on file  . Intimate partner violence:    Fear of current or ex partner: Not on file    Emotionally abused: Not on file    Physically abused: Not on file    Forced sexual activity: Not on file  Other Topics Concern  . Not on file  Social History Narrative   Would desire CPR.   Does not desire long term life support or feeding tubes.         The PMH, PSH, Social History, Family History, Medications, and allergies have been reviewed in [facility], and have been updated if relevant.   Review of  Systems  Constitutional: Negative.   HENT: Negative.   Eyes: Negative.   Respiratory:  Negative.   Cardiovascular: Negative.   Gastrointestinal: Negative.   Endocrine: Negative.   Genitourinary: Negative.   Musculoskeletal: Negative.   Skin: Negative.   Allergic/Immunologic: Negative.   Neurological: Negative.   Hematological: Negative.   Psychiatric/Behavioral: Negative.   All other systems reviewed and are negative.      Objective:    BP 122/62 (BP Location: Left Arm, Patient Position: Sitting, Cuff Size: Normal)   Pulse (!) 54   Temp 98.4 F (36.9 C) (Oral)   Ht 5\' 10"  (1.778 m)   Wt 173 lb 6.4 oz (78.7 kg)   SpO2 97%   BMI 24.88 kg/m   Wt Readings from Last 3 Encounters:  11/22/17 173 lb 6.4 oz (78.7 kg)  11/22/17 173 lb 6.4 oz (78.7 kg)  07/20/17 170 lb 12.8 oz (77.5 kg)    Physical Exam  General:  pleasant male in no acute distress Eyes:  PERRL Ears:  External ear exam shows no significant lesions or deformities.  TMs normal bilaterally Hearing is grossly normal bilaterally. Nose:  External nasal examination shows no deformity or inflammation. Nasal mucosa are pink and moist without lesions or exudates. Mouth:  Oral mucosa and oropharynx without lesions or exudates.  Teeth in good repair. Neck:  no carotid bruit or thyromegaly no cervical or supraclavicular lymphadenopathy  Lungs:  Normal respiratory effort, chest expands symmetrically. Lungs are clear to auscultation, no crackles or wheezes. Heart:  Normal rate and regular rhythm. S1 and S2 normal without gallop, murmur, click, rub or other extra sounds. Abdomen:  Bowel sounds positive,abdomen soft and non-tender without masses, organomegaly or hernias noted. Pulses:  R and L posterior tibial pulses are full and equal bilaterally  Extremities:  no edema  Psych:  Good eye contact, not anxious or depressed appearing     Assessment & Plan:   Diabetes mellitus type 2, diet-controlled ([place]) - Plan: POCT HgB  A1C, POCT UA - Microalbumin  Hyperlipidemia, unspecified hyperlipidemia type  Coronary artery disease involving native coronary artery without angina pectoris, unspecified whether native or transplanted heart No follow-ups on file.

## 2017-11-22 NOTE — Patient Instructions (Signed)
Great to see you. Please return in June 2019 for pain management.

## 2017-11-22 NOTE — Patient Instructions (Signed)
Continue to eat heart healthy diet (full of fruits, vegetables, whole grains, lean protein, water--limit salt, fat, and sugar intake) and increase physical activity as tolerated.  Continue doing brain stimulating activities (puzzles, reading, adult coloring books, staying active) to keep memory sharp.   Consider stopping smoking.   [name] , Thank you for taking time to come for your Medicare Wellness Visit. I appreciate your ongoing commitment to your health goals. Please review the following plan we discussed and let me know if I can assist you in the future.   These are the goals we discussed: Goals    . DIET - EAT MORE FRUITS AND VEGETABLES    . Quit Smoking       This is a list of the screening recommended for you and due dates:  Health Maintenance  Topic Date Due  . Urine Protein Check  05/11/2016  . Eye exam for diabetics  08/29/2016  . Hemoglobin A1C  03/28/2017  . Complete foot exam   11/14/2017  . Flu Shot  03/15/2018  . Tetanus Vaccine  07/23/2018  . Colon Cancer Screening  05/27/2020  . Pneumococcal vaccine  Completed  .  Hepatitis C: One time screening is recommended by Center for Disease Control  (CDC) for  adults born from [birth year] through 1964.   Completed  . HIV Screening  Completed    Health Maintenance, Male A healthy lifestyle and preventive care is important for your health and wellness. Ask your health care provider about what schedule of regular examinations is right for you. What should I know about weight and diet? Eat a Healthy Diet  Eat plenty of vegetables, fruits, whole grains, low-fat dairy products, and lean protein.  Do not eat a lot of foods high in solid fats, added sugars, or salt.  Maintain a Healthy Weight Regular exercise can help you achieve or maintain a healthy weight. You should:  Do at least 150 minutes of exercise each week. The exercise should increase your heart rate and make you sweat (moderate-intensity exercise).  Do  strength-training exercises at least twice a week.  Watch Your Levels of Cholesterol and Blood Lipids  Have your blood tested for lipids and cholesterol every 5 years starting at 61 years of age. If you are at high risk for heart disease, you should start having your blood tested when you are 61 years old. You may need to have your cholesterol levels checked more often if: ? Your lipid or cholesterol levels are high. ? You are older than 60 years of age. ? You are at high risk for heart disease.  What should I know about cancer screening? Many types of cancers can be detected early and may often be prevented. Lung Cancer  You should be screened every year for lung cancer if: ? You are a current smoker who has smoked for at least 30 years. ? You are a former smoker who has quit within the past 15 years.  Talk to your health care provider about your screening options, when you should start screening, and how often you should be screened.  Colorectal Cancer  Routine colorectal cancer screening usually begins at 61 years of age and should be repeated every 5-10 years until you are 61 years old. You may need to be screened more often if early forms of precancerous polyps or small growths are found. Your health care provider may recommend screening at an earlier age if you have risk factors for colon cancer.  Your  health care provider may recommend using home test kits to check for hidden blood in the stool.  A small camera at the end of a tube can be used to examine your colon (sigmoidoscopy or colonoscopy). This checks for the earliest forms of colorectal cancer.  Prostate and Testicular Cancer  Depending on your age and overall health, your health care provider may do certain tests to screen for prostate and testicular cancer.  Talk to your health care provider about any symptoms or concerns you have about testicular or prostate cancer.  Skin Cancer  Check your skin from head to toe  regularly.  Tell your health care provider about any new moles or changes in moles, especially if: ? There is a change in a mole's size, shape, or color. ? You have a mole that is larger than a pencil eraser.  Always use sunscreen. Apply sunscreen liberally and repeat throughout the day.  Protect yourself by wearing long sleeves, pants, a wide-brimmed hat, and sunglasses when outside.  What should I know about heart disease, diabetes, and high blood pressure?  If you are 35-87 years of age, have your blood pressure checked every 3-5 years. If you are 50 years of age or older, have your blood pressure checked every year. You should have your blood pressure measured twice-once when you are at a hospital or clinic, and once when you are not at a hospital or clinic. Record the average of the two measurements. To check your blood pressure when you are not at a hospital or clinic, you can use: ? An automated blood pressure machine at a pharmacy. ? A home blood pressure monitor.  Talk to your health care provider about your target blood pressure.  If you are between 81-26 years old, ask your health care provider if you should take aspirin to prevent heart disease.  Have regular diabetes screenings by checking your fasting blood sugar level. ? If you are at a normal weight and have a low risk for diabetes, have this test once every three years after the age of 13. ? If you are overweight and have a high risk for diabetes, consider being tested at a younger age or more often.  A one-time screening for abdominal aortic aneurysm (AAA) by ultrasound is recommended for men aged 40-75 years who are current or former smokers. What should I know about preventing infection? Hepatitis B If you have a higher risk for hepatitis B, you should be screened for this virus. Talk with your health care provider to find out if you are at risk for hepatitis B infection. Hepatitis C Blood testing is recommended  for:  Everyone born from [birth year] through 1964.  Anyone with known risk factors for hepatitis C.  Sexually Transmitted Diseases (STDs)  You should be screened each year for STDs including gonorrhea and chlamydia if: ? You are sexually active and are younger than 61 years of age. ? You are older than 61 years of age and your health care provider tells you that you are at risk for this type of infection. ? Your sexual activity has changed since you were last screened and you are at an increased risk for chlamydia or gonorrhea. Ask your health care provider if you are at risk.  Talk with your health care provider about whether you are at high risk of being infected with HIV. Your health care provider may recommend a prescription medicine to help prevent HIV infection.  What else can I do?  Schedule regular health, dental, and eye exams.  Stay current with your vaccines (immunizations).  Do not use any tobacco products, such as cigarettes, chewing tobacco, and e-cigarettes. If you need help quitting, ask your health care provider.  Limit alcohol intake to no more than 2 drinks per day. One drink equals 12 ounces of beer, 5 ounces of wine, or 1 ounces of hard liquor.  Do not use street drugs.  Do not share needles.  Ask your health care provider for help if you need support or information about quitting drugs.  Tell your health care provider if you often feel depressed.  Tell your health care provider if you have ever been abused or do not feel safe at home. This information is not intended to replace advice given to you by your health care provider. Make sure you discuss any questions you have with your health care provider. Document Released: 01/28/2008 Document Revised: 03/30/2016 Document Reviewed: 05/05/2015 Elsevier Interactive Patient Education  2018 [employer] with Quitting Smoking Quitting smoking is a physical and mental challenge. You will face cravings,  withdrawal symptoms, and temptation. Before quitting, work with your health care provider to make a plan that can help you cope. Preparation can help you quit and keep you from giving in. How can I cope with cravings? Cravings usually last for 5-10 minutes. If you get through it, the craving will pass. Consider taking the following actions to help you cope with cravings:  Keep your mouth busy: ? Chew sugar-free gum. ? Suck on hard candies or a straw. ? Brush your teeth.  Keep your hands and body busy: ? Immediately change to a different activity when you feel a craving. ? Squeeze or play with a ball. ? Do an activity or a hobby, like making bead jewelry, practicing needlepoint, or working with wood. ? Mix up your normal routine. ? Take a short exercise break. Go for a quick walk or run up and down stairs. ? Spend time in public places where smoking is not allowed.  Focus on doing something kind or helpful for someone else.  Call a friend or family member to talk during a craving.  Join a support group.  Call a quit line, such as 1-800-QUIT-NOW.  Talk with your health care provider about medicines that might help you cope with cravings and make quitting easier for you.  How can I deal with withdrawal symptoms? Your body may experience negative effects as it tries to get used to not having nicotine in the system. These effects are called withdrawal symptoms. They may include:  Feeling hungrier than normal.  Trouble concentrating.  Irritability.  Trouble sleeping.  Feeling depressed.  Restlessness and agitation.  Craving a cigarette.  To manage withdrawal symptoms:  Avoid places, people, and activities that trigger your cravings.  Remember why you want to quit.  Get plenty of sleep.  Avoid coffee and other caffeinated drinks. These may worsen some of your symptoms.  How can I handle social situations? Social situations can be difficult when you are quitting  smoking, especially in the first few weeks. To manage this, you can:  Avoid parties, bars, and other social situations where people might be smoking.  Avoid alcohol.  Leave right away if you have the urge to smoke.  Explain to your family and friends that you are quitting smoking. Ask for understanding and support.  Plan activities with friends or family where smoking is not an option.  What are some  ways I can cope with stress? Wanting to smoke may cause stress, and stress can make you want to smoke. Find ways to manage your stress. Relaxation techniques can help. For example:  Breathe slowly and deeply, in through your nose and out through your mouth.  Listen to soothing, relaxing music.  Talk with a family member or friend about your stress.  Light a candle.  Soak in a bath or take a shower.  Think about a peaceful place.  What are some ways I can prevent weight gain? Be aware that many people gain weight after they quit smoking. However, not everyone does. To keep from gaining weight, have a plan in place before you quit and stick to the plan after you quit. Your plan should include:  Having healthy snacks. When you have a craving, it may help to: ? Eat plain popcorn, crunchy carrots, celery, or other cut vegetables. ? Chew sugar-free gum.  Changing how you eat: ? Eat small portion sizes at meals. ? Eat 4-6 small meals throughout the day instead of 1-2 large meals a day. ? Be mindful when you eat. Do not watch television or do other things that might distract you as you eat.  Exercising regularly: ? Make time to exercise each day. If you do not have time for a long workout, do short bouts of exercise for 5-10 minutes several times a day. ? Do some form of strengthening exercise, like weight lifting, and some form of aerobic exercise, like running or swimming.  Drinking plenty of water or other low-calorie or no-calorie drinks. Drink 6-8 glasses of water daily, or as  much as instructed by your health care provider.  Summary  Quitting smoking is a physical and mental challenge. You will face cravings, withdrawal symptoms, and temptation to smoke again. Preparation can help you as you go through these challenges.  You can cope with cravings by keeping your mouth busy (such as by chewing gum), keeping your body and hands busy, and making calls to family, friends, or a helpline for people who want to quit smoking.  You can cope with withdrawal symptoms by avoiding places where people smoke, avoiding drinks with caffeine, and getting plenty of rest.  Ask your health care provider about the different ways to prevent weight gain, avoid stress, and handle social situations. This information is not intended to replace advice given to you by your health care provider. Make sure you discuss any questions you have with your health care provider. Document Released: 07/29/2016 Document Revised: 07/29/2016 Document Reviewed: 07/29/2016 Elsevier Interactive Patient Education  [employer].

## 2017-11-22 NOTE — Assessment & Plan Note (Signed)
Lab Results  Component Value Date   HGBA1C 5.8 11/22/2017   Diet controlled.

## 2017-11-27 NOTE — Progress Notes (Signed)
I reviewed health advisor's note, was available for consultation, and agree with documentation and plan.  

## 2017-12-13 ENCOUNTER — Encounter

## 2017-12-13 ENCOUNTER — Ambulatory Visit: Payer: Medicare HMO | Admitting: Nurse Practitioner

## 2017-12-21 ENCOUNTER — Other Ambulatory Visit: Payer: Self-pay

## 2017-12-21 ENCOUNTER — Other Ambulatory Visit: Payer: Self-pay | Admitting: Family Medicine

## 2017-12-21 MED ORDER — ALPRAZOLAM 1 MG PO TABS: ORAL_TABLET | ORAL | 2 refills | Status: DC

## 2018-01-22 ENCOUNTER — Encounter: Payer: Self-pay | Admitting: Family Medicine

## 2018-01-22 ENCOUNTER — Ambulatory Visit (INDEPENDENT_AMBULATORY_CARE_PROVIDER_SITE_OTHER): Payer: Medicare HMO | Admitting: Family Medicine

## 2018-01-22 VITALS — BP 120/56 | HR 65 | Temp 98.6°F | Ht 70.0 in | Wt 174.6 lb

## 2018-01-22 DIAGNOSIS — E785 Hyperlipidemia, unspecified: Secondary | ICD-10-CM

## 2018-01-22 DIAGNOSIS — F1120 Opioid dependence, uncomplicated: Secondary | ICD-10-CM | POA: Diagnosis not present

## 2018-01-22 LAB — LIPID PANEL
Cholesterol: 142 mg/dL (ref 0–200)
HDL: 44.3 mg/dL (ref >39.00)
LDL CALC: 80 mg/dL (ref 0–99)
NonHDL: 98.07
TRIGLYCERIDES: 90 mg/dL (ref 0.0–149.0)
Total CHOL/HDL Ratio: 3
VLDL: 18 mg/dL (ref 0.0–40.0)

## 2018-01-22 LAB — COMPREHENSIVE METABOLIC PANEL
ALT: 12 U/L (ref 0–53)
AST: 21 U/L (ref 0–37)
Albumin: 3.7 g/dL (ref 3.5–5.2)
Alkaline Phosphatase: 45 U/L (ref 39–117)
BUN: 17 mg/dL (ref 6–23)
CHLORIDE: 105 meq/L (ref 96–112)
CO2: 27 meq/L (ref 19–32)
Calcium: 8.9 mg/dL (ref 8.4–10.5)
Creatinine, Ser: 1.13 mg/dL (ref 0.40–1.50)
GFR: 70.21 mL/min (ref >60.00)
Glucose, Bld: 136 mg/dL — ABNORMAL HIGH (ref 70–99)
POTASSIUM: 4.1 meq/L (ref 3.5–5.1)
SODIUM: 139 meq/L (ref 135–145)
Total Bilirubin: 0.3 mg/dL (ref 0.2–1.2)
Total Protein: 6.4 g/dL (ref 6.0–8.3)

## 2018-01-22 MED ORDER — HYDROCODONE-ACETAMINOPHEN 10-325 MG PO TABS: 1.0000 | ORAL_TABLET | ORAL | 0 refills | Status: DC | PRN

## 2018-01-22 NOTE — Progress Notes (Signed)
Subjective:   Patient ID: [name], male    DOB: 1956-07-05, 62 y.o.   MRN: [id number]  [name] is a pleasant 62 y.o. year old male who presents to clinic today with Follow-up (follow up pain)  on 01/22/2018  HPI:  Chronic pain- back, hip, knee.  Feels narcotics take the edge off.  Indication for chronic opioid: DDD/DJD Medication and dose Norco 10/325- 1 tab every 4 hours as needed for pain # pills per month: 150 Last UDS date: 09/28/16 Pain contract signed (Y/N): Y Date narcotic database last reviewed (include red flags): 01/22/18  Lab Results  Component Value Date   ALT 11 02/09/2016   AST 25 02/09/2016   ALKPHOS 47 02/09/2016   BILITOT 0.4 02/09/2016      Current Outpatient Medications on File Prior to Visit  Medication Sig Dispense Refill  . ALPRAZolam (XANAX) 1 MG tablet TAKE ONE TABLET BY MOUTH THREE TIMES DAILY AS NEEDED FOR ANXIETY 90 tablet 2  . aspirin 81 MG tablet Take 81 mg by mouth daily.      . Calcium Citrate-Vitamin D 500-400 MG-UNIT CHEW Chew 1 tablet by mouth 2 (two) times daily.      . Cholecalciferol (VITAMIN D) 2000 UNITS tablet Take 5,000 Units by mouth daily.     . cyanocobalamin 500 MCG tablet Take 5,000 mcg by mouth daily.     Marland Kitchen ELIQUIS 5 MG TABS tablet TAKE 1 TABLET BY MOUTH TWICE DAILY 60 tablet 5  . fexofenadine (ALLEGRA) 180 MG tablet Take 180 mg by mouth daily as needed.    Marland Kitchen HYDROcodone-acetaminophen (NORCO) 10-325 MG tablet Take 1 tablet by mouth every 4 (four) hours as needed for severe pain. 150 tablet 0  . Multiple Vitamin (MULTIVITAMIN) tablet Take 1 tablet by mouth daily.      . nicotine (NICODERM CQ - DOSED IN MG/24 HOURS) 14 mg/24hr patch Place 1 patch (14 mg total) onto the skin daily. 28 patch 0  . nitroGLYCERIN (NITROSTAT) 0.3 MG SL tablet Place 1 tablet (0.3 mg total) under the tongue every 5 (five) minutes as needed. 90 tablet 3  . omeprazole (PRILOSEC) 20 MG capsule Take 20 mg by mouth daily.    . polyethylene glycol  powder (GLYCOLAX/MIRALAX) powder Take 1 Container by mouth once. 1/2 SCOP DAILY    . simvastatin (ZOCOR) 80 MG tablet Take 1 tablet (80 mg total) by mouth at bedtime. 90 tablet 1   No current facility-administered medications on file prior to visit.     Allergies  Allergen Reactions  . Codeine     REACTION: makes me "looney"    Past Medical History:  Diagnosis Date  . Anemia   . Arthritis   . CAD (coronary artery disease)   . Diabetes mellitus   . Hyperlipidemia   . Hypertension   . Left ventricular dysfunction   . Low back pain   . MI (myocardial infarction) ([place])   . Obesity   . PE (pulmonary embolism)   . Stroke ([place])   . Tobacco abuse     Past Surgical History:  Procedure Laterality Date  . CHOLECYSTECTOMY    . CORONARY ARTERY BYPASS GRAFT    . CORONARY STENT PLACEMENT     3 all in right side  . GASTRIC BYPASS    . HERNIA REPAIR    . IR GENERIC HISTORICAL  09/27/2016   IR ANGIO INTRA EXTRACRAN SEL COM CAROTID INNOMINATE UNI L MOD SED 09/27/2016 Luanne Bras, MD  [facility]  . IR GENERIC HISTORICAL  09/27/2016   IR PERCUTANEOUS ART THROMBECTOMY/INFUSION INTRACRANIAL INC DIAG ANGIO 09/27/2016 Luanne Bras, MD [facility]  . IR GENERIC HISTORICAL  11/07/2016   IR RADIOLOGIST EVAL & MGMT 11/07/2016 [facility]  . RADIOLOGY WITH ANESTHESIA N/A 09/27/2016   Procedure: RADIOLOGY WITH ANESTHESIA;  Surgeon: Luanne Bras, MD;  Location: [place];  Service: Radiology;  Laterality: N/A;    Family History  Problem Relation Age of Onset  . Emphysema Mother   . Heart attack Father   . Heart disease Daughter   . Heart disease Brother   . Arthritis Brother   . Hemophilia Brother   . Colon cancer Neg Hx     Social History   Socioeconomic History  . Marital status: Widowed    Spouse name: Not on file  . Number of children: Not on file  . Years of education: Not on file  . Highest education level: Not on file  Occupational History  . Not on file  Social  Needs  . Financial resource strain: Not on file  . Food insecurity:    Worry: Not on file    Inability: Not on file  . Transportation needs:    Medical: Not on file    Non-medical: Not on file  Tobacco Use  . Smoking status: Current Every Day Smoker    Packs/day: 1.00    Years: 40.00    Pack years: 40.00    Types: Cigarettes  . Smokeless tobacco: Former Network engineer and Sexual Activity  . Alcohol use: No  . Drug use: Yes    Types: Marijuana  . Sexual activity: Yes  Lifestyle  . Physical activity:    Days per week: Not on file    Minutes per session: Not on file  . Stress: Not on file  Relationships  . Social connections:    Talks on phone: Not on file    Gets together: Not on file    Attends religious service: Not on file    Active member of club or organization: Not on file    Attends meetings of clubs or organizations: Not on file    Relationship status: Not on file  . Intimate partner violence:    Fear of current or ex partner: Not on file    Emotionally abused: Not on file    Physically abused: Not on file    Forced sexual activity: Not on file  Other Topics Concern  . Not on file  Social History Narrative   Would desire CPR.   Does not desire long term life support or feeding tubes.         The PMH, PSH, Social History, Family History, Medications, and allergies have been reviewed in [facility], and have been updated if relevant.    Review of Systems  HENT: Negative.   Eyes: Negative.   Respiratory: Negative.   Cardiovascular: Negative.   Gastrointestinal: Negative.   Endocrine: Negative.   Genitourinary: Negative.   Musculoskeletal: Negative.   Allergic/Immunologic: Negative.   Neurological: Negative.   Hematological: Negative.   Psychiatric/Behavioral: Negative.   All other systems reviewed and are negative.      Objective:    BP (!) 120/56   Pulse 65   Temp 98.6 F (37 C) (Oral)   Ht 5\' 10"  (1.778 m)   Wt 174 lb 9.6 oz (79.2 kg)   SpO2  95%   BMI 25.05 kg/m    Physical Exam  Constitutional:  He is oriented to person, place, and time. He appears well-developed and well-nourished. No distress.  HENT:  Head: Normocephalic and atraumatic.  Eyes: Conjunctivae are normal.  Neck: Normal range of motion.  Cardiovascular: Normal rate.  Pulmonary/Chest: Effort normal.  Musculoskeletal: Normal range of motion. He exhibits no edema.  Neurological: He is alert and oriented to person, place, and time. No cranial nerve deficit.  Skin: Skin is warm and dry. He is not diaphoretic.  Psychiatric: He has a normal mood and affect. His behavior is normal. Judgment and thought content normal.  Nursing note and vitals reviewed.         Assessment & Plan:   Opioid type dependence, continuous ([name]) - Plan: Pain Mgmt, Profile 8 w/Conf, U, Comprehensive metabolic panel  Hyperlipidemia, unspecified hyperlipidemia type - Plan: Lipid panel No follow-ups on file.

## 2018-01-22 NOTE — Patient Instructions (Signed)
Great to see you. I will call you with your lab results from today and you can view them online.   

## 2018-01-22 NOTE — Assessment & Plan Note (Signed)
Doing well. UDS and labs today. Rxs printed and given to patient.

## 2018-01-27 LAB — PAIN MGMT, PROFILE 8 W/CONF, U
6 Acetylmorphine: NEGATIVE ng/mL (ref <10)
Alcohol Metabolites: NEGATIVE ng/mL (ref <500)
Alphahydroxyalprazolam: 270 ng/mL — ABNORMAL HIGH (ref <25)
Alphahydroxymidazolam: NEGATIVE ng/mL (ref <50)
Alphahydroxytriazolam: NEGATIVE ng/mL (ref <50)
Aminoclonazepam: NEGATIVE ng/mL (ref <25)
Amphetamines: NEGATIVE ng/mL (ref <500)
Benzodiazepines: POSITIVE ng/mL — AB (ref <100)
Buprenorphine, Urine: NEGATIVE ng/mL (ref <5)
Cocaine Metabolite: NEGATIVE ng/mL (ref <150)
Codeine: NEGATIVE ng/mL (ref <50)
Creatinine: 81.1 mg/dL
Hydrocodone: 787 ng/mL — ABNORMAL HIGH (ref <50)
Hydroxyethylflurazepam: NEGATIVE ng/mL (ref <50)
Lorazepam: NEGATIVE ng/mL (ref <50)
MDMA: NEGATIVE ng/mL (ref <500)
Marijuana Metabolite: 213 ng/mL — ABNORMAL HIGH (ref <5)
Marijuana Metabolite: POSITIVE ng/mL — AB (ref <20)
Morphine: NEGATIVE ng/mL (ref <50)
Nordiazepam: NEGATIVE ng/mL (ref <50)
Norhydrocodone: 1019 ng/mL — ABNORMAL HIGH (ref <50)
Opiates: POSITIVE ng/mL — AB (ref <100)
Oxazepam: NEGATIVE ng/mL (ref <50)
Oxidant: NEGATIVE ug/mL (ref <200)
Oxycodone: NEGATIVE ng/mL (ref <100)
Temazepam: NEGATIVE ng/mL (ref <50)
pH: 6.77 (ref 4.5–9.0)

## 2018-03-26 ENCOUNTER — Other Ambulatory Visit: Payer: Self-pay

## 2018-03-26 MED ORDER — ALPRAZOLAM 1 MG PO TABS: ORAL_TABLET | ORAL | 0 refills | Status: DC

## 2018-03-26 NOTE — Telephone Encounter (Signed)
CN-Refill request received via fax from pharmacy/LF: 7.11.19/LOV: 6.10.19/Per PMP no red flags/pt compliant/plz advise/thx dmf

## 2018-04-24 ENCOUNTER — Ambulatory Visit (INDEPENDENT_AMBULATORY_CARE_PROVIDER_SITE_OTHER): Payer: Medicare HMO | Admitting: Family Medicine

## 2018-04-24 ENCOUNTER — Encounter: Payer: Self-pay | Admitting: Family Medicine

## 2018-04-24 VITALS — BP 116/66 | HR 51 | Temp 98.3°F | Ht 70.0 in | Wt 170.0 lb

## 2018-04-24 DIAGNOSIS — F1120 Opioid dependence, uncomplicated: Secondary | ICD-10-CM | POA: Diagnosis not present

## 2018-04-24 DIAGNOSIS — Z23 Encounter for immunization: Secondary | ICD-10-CM

## 2018-04-24 DIAGNOSIS — G894 Chronic pain syndrome: Secondary | ICD-10-CM

## 2018-04-24 MED ORDER — HYDROCODONE-ACETAMINOPHEN 10-325 MG PO TABS: 1.0000 | ORAL_TABLET | ORAL | 0 refills | Status: DC | PRN

## 2018-04-24 NOTE — Assessment & Plan Note (Signed)
Update [facility] contract/UDS today. Indication for chronic opioid: DDD/DJD Medication and dose Norco 10/325- 1 tab every 4 hours as needed for pain # pills per month: 150 Last UDS date: 01/20/17 Pain contract signed (Y/N): Y Date narcotic database last reviewed (include red flags): 04/23/18

## 2018-04-24 NOTE — Progress Notes (Signed)
Subjective:   Patient ID: [name], male    DOB: 03/26/1956, 62 y.o.   MRN: [id number]  [name] is a pleasant 62 y.o. year old male who presents to clinic today with Follow-up (Patient is here today for a 47-month-F/U. He currently takes Hydrocodone-APAP 10-325mg  and Alprazolam 1mg  for anxiety and for chronic pain and arthritis.  PMP ok with no red flags, CSC updated today. )  on 04/24/2018  HPI:  Chronic pain- back, hip, knee.  Feels pain is under reasonable control with current rxs.  Indication for chronic opioid: DDD/DJD Medication and dose Norco 10/325- 1 tab every 4 hours as needed for pain # pills per month: 150 Last UDS date: 01/20/17 Pain contract signed (Y/N): Y Date narcotic database last reviewed (include red flags): 04/23/18  Lab Results  Component Value Date   ALT 12 01/22/2018   AST 21 01/22/2018   ALKPHOS 45 01/22/2018   BILITOT 0.3 01/22/2018      Current Outpatient Medications on File Prior to Visit  Medication Sig Dispense Refill  . ALPRAZolam (XANAX) 1 MG tablet TAKE ONE TABLET BY MOUTH THREE TIMES DAILY AS NEEDED FOR ANXIETY 90 tablet 0  . aspirin 81 MG tablet Take 81 mg by mouth daily.      . Calcium Citrate-Vitamin D 500-400 MG-UNIT CHEW Chew 1 tablet by mouth 2 (two) times daily.      . Cholecalciferol (VITAMIN D) 2000 UNITS tablet Take 5,000 Units by mouth daily.     . cyanocobalamin 500 MCG tablet Take 5,000 mcg by mouth daily.     Marland Kitchen ELIQUIS 5 MG TABS tablet TAKE 1 TABLET BY MOUTH TWICE DAILY 60 tablet 5  . fexofenadine (ALLEGRA) 180 MG tablet Take 180 mg by mouth daily as needed.    Marland Kitchen HYDROcodone-acetaminophen (NORCO) 10-325 MG tablet Take 1 tablet by mouth every 4 (four) hours as needed for severe pain. 150 tablet 0  . Multiple Vitamin (MULTIVITAMIN) tablet Take 1 tablet by mouth daily.      . nitroGLYCERIN (NITROSTAT) 0.3 MG SL tablet Place 1 tablet (0.3 mg total) under the tongue every 5 (five) minutes as needed. 90 tablet 3  . omeprazole  (PRILOSEC) 20 MG capsule Take 20 mg by mouth daily.    . polyethylene glycol powder (GLYCOLAX/MIRALAX) powder Take 1 Container by mouth once. 1/2 SCOP DAILY    . simvastatin (ZOCOR) 80 MG tablet Take 1 tablet (80 mg total) by mouth at bedtime. 90 tablet 1  . nicotine (NICODERM CQ - DOSED IN MG/24 HOURS) 14 mg/24hr patch Place 1 patch (14 mg total) onto the skin daily. (Patient not taking: Reported on 04/24/2018) 28 patch 0   No current facility-administered medications on file prior to visit.     Allergies  Allergen Reactions  . Codeine     REACTION: makes me "looney"    Past Medical History:  Diagnosis Date  . Anemia   . Arthritis   . CAD (coronary artery disease)   . Diabetes mellitus   . Hyperlipidemia   . Hypertension   . Left ventricular dysfunction   . Low back pain   . MI (myocardial infarction) ([place])   . Obesity   . PE (pulmonary embolism)   . Stroke ([place])   . Tobacco abuse     Past Surgical History:  Procedure Laterality Date  . CHOLECYSTECTOMY    . CORONARY ARTERY BYPASS GRAFT    . CORONARY STENT PLACEMENT     3 all  in right side  . GASTRIC BYPASS    . HERNIA REPAIR    . IR GENERIC HISTORICAL  09/27/2016   IR ANGIO INTRA EXTRACRAN SEL COM CAROTID INNOMINATE UNI L MOD SED 09/27/2016 Luanne Bras, MD [facility]  . IR GENERIC HISTORICAL  09/27/2016   IR PERCUTANEOUS ART THROMBECTOMY/INFUSION INTRACRANIAL INC DIAG ANGIO 09/27/2016 Luanne Bras, MD [facility]  . IR GENERIC HISTORICAL  11/07/2016   IR RADIOLOGIST EVAL & MGMT 11/07/2016 [facility]  . RADIOLOGY WITH ANESTHESIA N/A 09/27/2016   Procedure: RADIOLOGY WITH ANESTHESIA;  Surgeon: Luanne Bras, MD;  Location: [place];  Service: Radiology;  Laterality: N/A;    Family History  Problem Relation Age of Onset  . Emphysema Mother   . Heart attack Father   . Heart disease Daughter   . Heart disease Brother   . Arthritis Brother   . Hemophilia Brother   . Colon cancer Neg Hx     Social  History   Socioeconomic History  . Marital status: Widowed    Spouse name: Not on file  . Number of children: Not on file  . Years of education: Not on file  . Highest education level: Not on file  Occupational History  . Not on file  Social Needs  . Financial resource strain: Not on file  . Food insecurity:    Worry: Not on file    Inability: Not on file  . Transportation needs:    Medical: Not on file    Non-medical: Not on file  Tobacco Use  . Smoking status: Current Every Day Smoker    Packs/day: 1.00    Years: 40.00    Pack years: 40.00    Types: Cigarettes  . Smokeless tobacco: Former Network engineer and Sexual Activity  . Alcohol use: No  . Drug use: Yes    Types: Marijuana  . Sexual activity: Yes  Lifestyle  . Physical activity:    Days per week: Not on file    Minutes per session: Not on file  . Stress: Not on file  Relationships  . Social connections:    Talks on phone: Not on file    Gets together: Not on file    Attends religious service: Not on file    Active member of club or organization: Not on file    Attends meetings of clubs or organizations: Not on file    Relationship status: Not on file  . Intimate partner violence:    Fear of current or ex partner: Not on file    Emotionally abused: Not on file    Physically abused: Not on file    Forced sexual activity: Not on file  Other Topics Concern  . Not on file  Social History Narrative   Would desire CPR.   Does not desire long term life support or feeding tubes.         The PMH, PSH, Social History, Family History, Medications, and allergies have been reviewed in [facility], and have been updated if relevant.    Review of Systems  HENT: Negative.   Eyes: Negative.   Respiratory: Negative.   Cardiovascular: Negative.   Gastrointestinal: Negative.   Endocrine: Negative.   Genitourinary: Negative.   Musculoskeletal: Negative.   Allergic/Immunologic: Negative.   Neurological: Negative.     Hematological: Negative.   Psychiatric/Behavioral: Negative.   All other systems reviewed and are negative.      Objective:    BP 116/66 (BP Location: Left Arm,  Patient Position: Sitting, Cuff Size: Normal)   Pulse (!) 51   Temp 98.3 F (36.8 C) (Oral)   Ht 5\' 10"  (1.778 m)   Wt 170 lb (77.1 kg)   SpO2 97%   BMI 24.39 kg/m    Physical Exam  Constitutional: He is oriented to person, place, and time. He appears well-developed and well-nourished. No distress.  HENT:  Head: Normocephalic and atraumatic.  Eyes: Conjunctivae are normal.  Neck: Normal range of motion.  Cardiovascular: Normal rate.  Pulmonary/Chest: Effort normal.  Musculoskeletal: Normal range of motion. He exhibits no edema.  Neurological: He is alert and oriented to person, place, and time. No cranial nerve deficit.  Skin: Skin is warm and dry. He is not diaphoretic.  Psychiatric: He has a normal mood and affect. His behavior is normal. Judgment and thought content normal.  Nursing note and vitals reviewed.         Assessment & Plan:   Opioid type dependence, continuous ([name])  Need for influenza vaccination - Plan: Flu Vaccine QUAD 6+ mos PF IM (Fluarix Quad PF)  Chronic pain syndrome No follow-ups on file.

## 2018-04-25 ENCOUNTER — Telehealth: Payer: Self-pay

## 2018-04-25 NOTE — Telephone Encounter (Signed)
Pt brought in all needed forms for completion of pt assist paperwork/faxed to [employer] for Eliquis/thx dmf

## 2018-04-26 ENCOUNTER — Other Ambulatory Visit: Payer: Self-pay | Admitting: Nurse Practitioner

## 2018-04-26 MED ORDER — ALPRAZOLAM 1 MG PO TABS: ORAL_TABLET | ORAL | 5 refills | Status: DC

## 2018-04-26 NOTE — Telephone Encounter (Signed)
TA-Plz send in refill for pt/see yesterday/everything UTD/thx dmf

## 2018-05-01 LAB — PAIN MGMT, PROFILE 8 W/CONF, U
6 Acetylmorphine: NEGATIVE ng/mL (ref <10)
ALPHAHYDROXYALPRAZOLAM: 737 ng/mL — AB (ref <25)
ALPHAHYDROXYTRIAZOLAM: NEGATIVE ng/mL (ref <50)
AMINOCLONAZEPAM: NEGATIVE ng/mL (ref <25)
Alcohol Metabolites: NEGATIVE ng/mL (ref <500)
Alphahydroxymidazolam: NEGATIVE ng/mL (ref <50)
Amphetamines: NEGATIVE ng/mL (ref <500)
BENZODIAZEPINES: POSITIVE ng/mL — AB (ref <100)
BUPRENORPHINE, URINE: NEGATIVE ng/mL (ref <5)
CODEINE: NEGATIVE ng/mL (ref <50)
Cocaine Metabolite: NEGATIVE ng/mL (ref <150)
Creatinine: 172.9 mg/dL
HYDROCODONE: 2599 ng/mL — AB (ref <50)
HYDROMORPHONE: 275 ng/mL — AB (ref <50)
HYDROXYETHYLFLURAZEPAM: NEGATIVE ng/mL (ref <50)
LORAZEPAM: NEGATIVE ng/mL (ref <50)
MARIJUANA METABOLITE: POSITIVE ng/mL — AB (ref <20)
MDMA: NEGATIVE ng/mL (ref <500)
Marijuana Metabolite: 349 ng/mL — ABNORMAL HIGH (ref <5)
Morphine: NEGATIVE ng/mL (ref <50)
Nordiazepam: NEGATIVE ng/mL (ref <50)
Norhydrocodone: 5077 ng/mL — ABNORMAL HIGH (ref <50)
OPIATES: POSITIVE ng/mL — AB (ref <100)
OXAZEPAM: NEGATIVE ng/mL (ref <50)
OXYCODONE: NEGATIVE ng/mL (ref <100)
Oxidant: NEGATIVE ug/mL (ref <200)
TEMAZEPAM: NEGATIVE ng/mL (ref <50)
pH: 6.53 (ref 4.5–9.0)

## 2018-05-15 NOTE — Telephone Encounter (Signed)
Patient called to request information on Eliquis wanted to know what the status was and how soon will he be able to get this medication. Request a call back with info. Ph# [phone]

## 2018-05-22 ENCOUNTER — Other Ambulatory Visit: Payer: Self-pay

## 2018-05-22 NOTE — Progress Notes (Signed)
I called [employer] at [phone] and they needed the SIG chaned on a page to "Take 1 tablet twice daily" and refaxed/I advised pt to call the company if they have not contacted him by Friday per their instruction/thx dmf

## 2018-05-22 NOTE — Telephone Encounter (Signed)
Done/pt aware/thx dmf

## 2018-05-22 NOTE — Telephone Encounter (Signed)
Patient called in for an update on Eliquis. He is requesting a callback [phone]

## 2018-06-06 ENCOUNTER — Telehealth: Payer: Self-pay | Admitting: Emergency Medicine

## 2018-06-06 ENCOUNTER — Encounter: Payer: Self-pay | Admitting: Family Medicine

## 2018-06-06 NOTE — Telephone Encounter (Signed)
Dr. Deborra Medina - please advise.  Eliquis does have a co-pay card, but patients with Medicare cannot use the card.

## 2018-06-06 NOTE — Telephone Encounter (Signed)
Copied from [place] [phone]. Topic: General - Other >> Jun 06, 2018  9:16 AM Carolyn Stare wrote:  Pt call to say he has been denied help with ELIQUIS 5 MG TABS tablet  and he can not afford it. He said there may need to be a med change

## 2018-06-06 NOTE — Telephone Encounter (Signed)
Can you please look into this for me?

## 2018-06-07 NOTE — Telephone Encounter (Signed)
Patient was able to get medication, see mychart message.

## 2018-06-07 NOTE — Telephone Encounter (Signed)
Pt approved till 12.31.2019 per fax from [employer]/thx dmf

## 2018-06-27 ENCOUNTER — Other Ambulatory Visit: Payer: Self-pay | Admitting: Family Medicine

## 2018-07-24 ENCOUNTER — Ambulatory Visit: Payer: Medicare HMO | Admitting: Family Medicine

## 2018-07-24 NOTE — Progress Notes (Unsigned)
Error

## 2018-07-25 ENCOUNTER — Other Ambulatory Visit: Payer: Self-pay | Admitting: Family Medicine

## 2018-07-25 NOTE — Telephone Encounter (Signed)
Pharmacy calling to advise pt had appt yesterday, but was cancelled by the dr.  His next appt is not until 07/31/18, but pharmacy states pt is completely out of the  HYDROcodone-acetaminophen ([place]) 10-325 MG tablet  And pt  Needs asap please, per pharmacy  [place], [place] - [place] [phone] (Phone) [phone] (Fax)   (Pt ok for xanax until next month)

## 2018-07-26 ENCOUNTER — Other Ambulatory Visit: Payer: Self-pay | Admitting: Family Medicine

## 2018-07-26 MED ORDER — HYDROCODONE-ACETAMINOPHEN 10-325 MG PO TABS: 1.0000 | ORAL_TABLET | ORAL | 0 refills | Status: DC | PRN

## 2018-07-26 NOTE — Telephone Encounter (Signed)
TA-LOV: 9.10.19/NOV: 12.17.19/CSC & UDS are UTD/PMP ok no red flags/thx dmf

## 2018-07-31 ENCOUNTER — Ambulatory Visit: Payer: Medicare HMO | Admitting: Family Medicine

## 2018-08-17 NOTE — Progress Notes (Signed)
Subjective:   Patient ID: [name], male    DOB: 1956-01-30, 63 y.o.   MRN: [id number]  [name] is a pleasant 63 y.o. year old male who presents to clinic today with Follow-up (Patient is here today for a F/U.  Last CMP Glucose was 136 and last A1C was on 4.10.19 and was 5.8.  Last Lipid on 6.10.19 WNL.  UDS & CSC are UTD.  [place] PMP viewed no red flags.)  on 08/20/2018  HPI:  Chronic pain- back, hip, knee.  Feels narcotics take the edge off.  Indication for chronic opioid: DDD/DJD Medication and dose Norco 10/325- 1 tab every 4 hours as needed for pain # pills per month: 150 Last UDS date: 04/24/18 Pain contract signed (Y/N): Y Date narcotic database last reviewed (include red flags): 08/17/18  Tobacco abuse- smoking 1/2 ppd- 1 ppd. Insurance would not cover the nicoderm CQ patches.  He is willing to try to quit smoking on his own.  Diabetes- has been diet controlled.  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  He has also been compliant with Zocor 80 mg daily.  Lab Results  Component Value Date   CHOL 142 01/22/2018   HDL 44.30 01/22/2018   LDLCALC 80 01/22/2018   LDLDIRECT 54.0 02/10/2015   TRIG 90.0 01/22/2018   CHOLHDL 3 01/22/2018   Lab Results  Component Value Date   ALT 12 01/22/2018   AST 21 01/22/2018   ALKPHOS 45 01/22/2018   BILITOT 0.3 01/22/2018   Current Outpatient Medications on File Prior to Visit  Medication Sig Dispense Refill  . aspirin 81 MG tablet Take 81 mg by mouth daily.      . Calcium Citrate-Vitamin D 500-400 MG-UNIT CHEW Chew 1 tablet by mouth 2 (two) times daily.      . Cholecalciferol (VITAMIN D) 2000 UNITS tablet Take 5,000 Units by mouth daily.     . cyanocobalamin 500 MCG tablet Take 5,000 mcg by mouth daily.     Marland Kitchen ELIQUIS 5 MG TABS tablet TAKE 1 TABLET BY MOUTH TWICE DAILY 60  tablet 5  . fexofenadine (ALLEGRA) 180 MG tablet Take 180 mg by mouth daily as needed.    . Multiple Vitamin (MULTIVITAMIN) tablet Take 1 tablet by mouth daily.      Marland Kitchen omeprazole (PRILOSEC) 20 MG capsule Take 20 mg by mouth daily.    . polyethylene glycol powder (GLYCOLAX/MIRALAX) powder Take 1 Container by mouth once. 1/2 SCOP DAILY    . simvastatin (ZOCOR) 80 MG tablet TAKE ONE TABLET BY MOUTH AT BEDTIME. 90 tablet 0   No current facility-administered medications on file prior to visit.     Allergies  Allergen Reactions  . Codeine     REACTION: makes me "looney"    Past Medical History:  Diagnosis Date  . Anemia   . Arthritis   . CAD (coronary artery disease)   . Diabetes mellitus   . Hyperlipidemia   . Hypertension   . Left ventricular dysfunction   . Low back pain   . MI (myocardial infarction) ([place])   . Obesity   . PE (pulmonary embolism)   . Stroke ([place])   . Tobacco abuse     Past Surgical History:  Procedure Laterality Date  . CHOLECYSTECTOMY    . CORONARY ARTERY BYPASS  GRAFT    . CORONARY STENT PLACEMENT     3 all in right side  . GASTRIC BYPASS    . HERNIA REPAIR    . IR GENERIC HISTORICAL  09/27/2016   IR ANGIO INTRA EXTRACRAN SEL COM CAROTID INNOMINATE UNI L MOD SED 09/27/2016 Luanne Bras, MD [facility]  . IR GENERIC HISTORICAL  09/27/2016   IR PERCUTANEOUS ART THROMBECTOMY/INFUSION INTRACRANIAL INC DIAG ANGIO 09/27/2016 Luanne Bras, MD [facility]  . IR GENERIC HISTORICAL  11/07/2016   IR RADIOLOGIST EVAL & MGMT 11/07/2016 [facility]  . RADIOLOGY WITH ANESTHESIA N/A 09/27/2016   Procedure: RADIOLOGY WITH ANESTHESIA;  Surgeon: Luanne Bras, MD;  Location: [place];  Service: Radiology;  Laterality: N/A;    Family History  Problem Relation Age of Onset  . Emphysema Mother   . Heart attack Father   . Heart disease Daughter   . Heart disease Brother   . Arthritis Brother   . Hemophilia Brother   . Colon cancer Neg Hx     Social  History   Socioeconomic History  . Marital status: Widowed    Spouse name: Not on file  . Number of children: Not on file  . Years of education: Not on file  . Highest education level: Not on file  Occupational History  . Not on file  Social Needs  . Financial resource strain: Not on file  . Food insecurity:    Worry: Not on file    Inability: Not on file  . Transportation needs:    Medical: Not on file    Non-medical: Not on file  Tobacco Use  . Smoking status: Current Every Day Smoker    Packs/day: 1.00    Years: 40.00    Pack years: 40.00    Types: Cigarettes  . Smokeless tobacco: Former Network engineer and Sexual Activity  . Alcohol use: No  . Drug use: Yes    Types: Marijuana  . Sexual activity: Yes  Lifestyle  . Physical activity:    Days per week: Not on file    Minutes per session: Not on file  . Stress: Not on file  Relationships  . Social connections:    Talks on phone: Not on file    Gets together: Not on file    Attends religious service: Not on file    Active member of club or organization: Not on file    Attends meetings of clubs or organizations: Not on file    Relationship status: Not on file  . Intimate partner violence:    Fear of current or ex partner: Not on file    Emotionally abused: Not on file    Physically abused: Not on file    Forced sexual activity: Not on file  Other Topics Concern  . Not on file  Social History Narrative   Would desire CPR.   Does not desire long term life support or feeding tubes.         The PMH, PSH, Social History, Family History, Medications, and allergies have been reviewed in [facility], and have been updated if relevant.    Review of Systems  HENT: Negative.   Eyes: Negative.   Respiratory: Negative.   Cardiovascular: Negative.   Gastrointestinal: Negative.   Endocrine: Negative.   Genitourinary: Negative.   Musculoskeletal: Negative.   Allergic/Immunologic: Negative.   Neurological: Negative.     Hematological: Negative.   Psychiatric/Behavioral: Negative.   All other systems reviewed and are negative.  Objective:    BP 140/78 (BP Location: Left Arm, Patient Position: Sitting, Cuff Size: Normal)   Pulse 60   Temp 98.3 F (36.8 C) (Oral)   Ht 5\' 10"  (1.778 m)   Wt 175 lb 3.2 oz (79.5 kg)   SpO2 97%   BMI 25.14 kg/m    Physical Exam  Constitutional: He is oriented to person, place, and time. He appears well-developed and well-nourished. No distress.  HENT:  Head: Normocephalic and atraumatic.  Eyes: Conjunctivae are normal.  Neck: Normal range of motion.  Cardiovascular: Normal rate.  Pulmonary/Chest: Effort normal and breath sounds normal.  Abdominal: Soft.  Musculoskeletal: Normal range of motion.        General: No edema.  Neurological: He is alert and oriented to person, place, and time. No cranial nerve deficit.  Skin: Skin is warm and dry. He is not diaphoretic.  Psychiatric: He has a normal mood and affect. His behavior is normal. Judgment and thought content normal.  Nursing note and vitals reviewed.         Assessment & Plan:   Diabetes mellitus type 2, diet-controlled ([place]) - Plan: POCT HgB A1C  Vitamin D deficiency  Hyperlipidemia, unspecified hyperlipidemia type  Iron deficiency anemia, unspecified iron deficiency anemia type  Essential hypertension  TOBACCO ABUSE  PULMONARY EMBOLISM, HX OF  Coronary artery disease involving native coronary artery without angina pectoris, unspecified whether native or transplanted heart  History of pulmonary embolism  Hx of CABG  Opioid type dependence, continuous ([place])  Need for Tdap vaccination - Plan: Tdap vaccine greater than or equal to 7yo IM Return in about 14 weeks (around 11/26/2018) for a complete physical..

## 2018-08-20 ENCOUNTER — Encounter: Payer: Self-pay | Admitting: Family Medicine

## 2018-08-20 ENCOUNTER — Ambulatory Visit (INDEPENDENT_AMBULATORY_CARE_PROVIDER_SITE_OTHER): Payer: Medicare HMO | Admitting: Family Medicine

## 2018-08-20 VITALS — BP 140/78 | HR 60 | Temp 98.3°F | Ht 70.0 in | Wt 175.2 lb

## 2018-08-20 DIAGNOSIS — G894 Chronic pain syndrome: Secondary | ICD-10-CM

## 2018-08-20 DIAGNOSIS — I10 Essential (primary) hypertension: Secondary | ICD-10-CM

## 2018-08-20 DIAGNOSIS — E559 Vitamin D deficiency, unspecified: Secondary | ICD-10-CM

## 2018-08-20 DIAGNOSIS — Z86718 Personal history of other venous thrombosis and embolism: Secondary | ICD-10-CM

## 2018-08-20 DIAGNOSIS — E119 Type 2 diabetes mellitus without complications: Secondary | ICD-10-CM | POA: Diagnosis not present

## 2018-08-20 DIAGNOSIS — I2510 Atherosclerotic heart disease of native coronary artery without angina pectoris: Secondary | ICD-10-CM

## 2018-08-20 DIAGNOSIS — Z86711 Personal history of pulmonary embolism: Secondary | ICD-10-CM

## 2018-08-20 DIAGNOSIS — Z951 Presence of aortocoronary bypass graft: Secondary | ICD-10-CM

## 2018-08-20 DIAGNOSIS — E785 Hyperlipidemia, unspecified: Secondary | ICD-10-CM | POA: Diagnosis not present

## 2018-08-20 DIAGNOSIS — Z23 Encounter for immunization: Secondary | ICD-10-CM | POA: Diagnosis not present

## 2018-08-20 DIAGNOSIS — D509 Iron deficiency anemia, unspecified: Secondary | ICD-10-CM | POA: Diagnosis not present

## 2018-08-20 DIAGNOSIS — F17200 Nicotine dependence, unspecified, uncomplicated: Secondary | ICD-10-CM

## 2018-08-20 DIAGNOSIS — F1120 Opioid dependence, uncomplicated: Secondary | ICD-10-CM

## 2018-08-20 LAB — POCT GLYCOSYLATED HEMOGLOBIN (HGB A1C)
HbA1c POC (<> result, manual entry): 5.7 % (ref 4.0–5.6)
Hemoglobin A1C: 5.7 % — AB (ref 4.0–5.6)

## 2018-08-20 MED ORDER — ALPRAZOLAM 1 MG PO TABS: ORAL_TABLET | ORAL | 5 refills | Status: DC

## 2018-08-20 MED ORDER — HYDROCODONE-ACETAMINOPHEN 10-325 MG PO TABS: 1.0000 | ORAL_TABLET | ORAL | 0 refills | Status: DC | PRN

## 2018-08-20 MED ORDER — NITROGLYCERIN 0.3 MG SL SUBL: 0.3000 mg | SUBLINGUAL_TABLET | SUBLINGUAL | 3 refills | Status: AC | PRN

## 2018-08-20 NOTE — Assessment & Plan Note (Signed)
Compliant with CSC/UDS. No changes made to rxs today.

## 2018-08-20 NOTE — Assessment & Plan Note (Signed)
The patient was counseled on the dangers of tobacco use, and was advised to quit.  Reviewed strategies to maximize success, including removing cigarettes and smoking materials from environment. 

## 2018-08-20 NOTE — Assessment & Plan Note (Signed)
Well controlled with statin. Due for labs when he returns for CPX/wellness visit in 11/2018.

## 2018-08-20 NOTE — Assessment & Plan Note (Signed)
Medication management s/p CABG. Continue current rxs. Has not had to take NTG in months.  Needs refill today. eRx sent.

## 2018-08-20 NOTE — Assessment & Plan Note (Signed)
Diet controlled.  No changes made to rxs today.

## 2018-08-20 NOTE — Assessment & Plan Note (Signed)
Well controlled. No changes made to rxs today. 

## 2018-08-20 NOTE — Patient Instructions (Addendum)
Great to see you.  Please schedule your medicare wellness visit/follow up with me in April 2020 (anytime after 11/23/2008).  Let me know if you are able to get nicotine products.

## 2018-09-25 ENCOUNTER — Other Ambulatory Visit: Payer: Self-pay | Admitting: Family Medicine

## 2018-10-22 ENCOUNTER — Other Ambulatory Visit: Payer: Self-pay | Admitting: Family Medicine

## 2018-10-22 MED ORDER — HYDROCODONE-ACETAMINOPHEN 10-325 MG PO TABS: 1.0000 | ORAL_TABLET | ORAL | 0 refills | Status: DC | PRN

## 2018-11-22 ENCOUNTER — Other Ambulatory Visit: Payer: Self-pay | Admitting: Family Medicine

## 2018-11-22 MED ORDER — HYDROCODONE-ACETAMINOPHEN 10-325 MG PO TABS: 1.0000 | ORAL_TABLET | ORAL | 0 refills | Status: DC | PRN

## 2018-11-22 NOTE — Telephone Encounter (Signed)
Pt requesting refill. Sen to Dr. Deborra Medina for Auth/Approval.

## 2018-11-27 ENCOUNTER — Encounter: Payer: Self-pay | Admitting: Family Medicine

## 2018-11-27 ENCOUNTER — Telehealth: Payer: Self-pay

## 2018-11-27 NOTE — Telephone Encounter (Signed)
Copied from [place] [id number]. Topic: Appointment Scheduling - Scheduling Inquiry for Clinic >> Nov 26, 2018  4:33 PM Reyne Dumas L wrote: Reason for CRM:   Pt called and left message on [place] to request appt for poverty tax exclusion form to be filled out.

## 2018-11-27 NOTE — Telephone Encounter (Signed)
Form is completed and awaiting signature from TA/I have sent pt a MyChart message/thx dmf

## 2018-11-28 ENCOUNTER — Ambulatory Visit: Payer: Medicare HMO | Admitting: *Deleted

## 2018-11-28 ENCOUNTER — Ambulatory Visit: Payer: Medicare HMO | Admitting: Family Medicine

## 2018-12-22 ENCOUNTER — Other Ambulatory Visit: Payer: Self-pay | Admitting: Family Medicine

## 2018-12-24 ENCOUNTER — Other Ambulatory Visit: Payer: Self-pay | Admitting: Family Medicine

## 2018-12-26 MED ORDER — HYDROCODONE-ACETAMINOPHEN 10-325 MG PO TABS: 1.0000 | ORAL_TABLET | ORAL | 0 refills | Status: DC | PRN

## 2018-12-26 NOTE — Telephone Encounter (Signed)
TA-Per [place] PMP pt is compliant without red flag/UDS & CSC UTD/NOV 6.10.20/thx dmf

## 2018-12-26 NOTE — Telephone Encounter (Signed)
This was Erx'ed today/duplicate request/thx dmf

## 2019-01-02 ENCOUNTER — Other Ambulatory Visit: Payer: Self-pay | Admitting: Family Medicine

## 2019-01-22 ENCOUNTER — Telehealth: Payer: Self-pay

## 2019-01-22 NOTE — Progress Notes (Signed)
Subjective:   [name] is a 63 y.o. male who presents for Medicare Annual/Subsequent preventive examination.  Pt works part time as Dealer.   Review of Systems: No ROS.  Medicare Wellness Virtual Visit.   Cardiac Risk Factors include: advanced age (>53men, >59 women);diabetes mellitus;dyslipidemia;hypertension;male gender;smoking/ tobacco exposure Sleep patterns: no issues Home Safety/Smoke Alarms: Feels safe in home. Smoke alarms in place.  Lives alone.  Male:   CCS-  06/02/15. 5 yr recall.   PSA-  Lab Results  Component Value Date   PSA 0.59 11/11/2014   PSA 0.45 10/31/2013   PSA 0.67 10/31/2012       Objective:    Vitals: BP 126/74 Comment: All vitals done by M. Frederick CMA  Pulse 61   Temp (!) 97.5 F (36.4 C) (Oral)   Ht 5\' 10"  (1.778 m)   Wt 183 lb (83 kg)   SpO2 95%   BMI 26.26 kg/m   Body mass index is 26.26 kg/m.  Advanced Directives 01/23/2019 11/22/2017 11/12/2015 05/19/2015 12/24/2014  Does Patient Have a Medical Advance Directive? No No No No No  Would patient like information on creating a medical advance directive? No - Patient declined No - Patient declined Yes - Educational materials given No - patient declined information No - patient declined information    Tobacco Social History   Tobacco Use  Smoking Status Current Every Day Smoker  . Packs/day: 1.00  . Years: 40.00  . Pack years: 40.00  . Types: Cigarettes  Smokeless Tobacco Former Systems developer     Ready to quit: Not Answered Counseling given: Not Answered   Clinical Intake: Pain : No/denies pain    Past Medical History:  Diagnosis Date  . Anemia   . Arthritis   . CAD (coronary artery disease)   . Diabetes mellitus   . Hyperlipidemia   . Hypertension   . Left ventricular dysfunction   . Low back pain   . MI (myocardial infarction) ([place])   . Obesity   . PE (pulmonary embolism)   . Stroke ([place])   . Tobacco abuse    Past Surgical History:  Procedure Laterality Date   . CHOLECYSTECTOMY    . CORONARY ARTERY BYPASS GRAFT    . CORONARY STENT PLACEMENT     3 all in right side  . GASTRIC BYPASS    . HERNIA REPAIR    . IR GENERIC HISTORICAL  09/27/2016   IR ANGIO INTRA EXTRACRAN SEL COM CAROTID INNOMINATE UNI L MOD SED 09/27/2016 Luanne Bras, MD [facility]  . IR GENERIC HISTORICAL  09/27/2016   IR PERCUTANEOUS ART THROMBECTOMY/INFUSION INTRACRANIAL INC DIAG ANGIO 09/27/2016 Luanne Bras, MD [facility]  . IR GENERIC HISTORICAL  11/07/2016   IR RADIOLOGIST EVAL & MGMT 11/07/2016 [facility]  . RADIOLOGY WITH ANESTHESIA N/A 09/27/2016   Procedure: RADIOLOGY WITH ANESTHESIA;  Surgeon: Luanne Bras, MD;  Location: [place];  Service: Radiology;  Laterality: N/A;   Family History  Problem Relation Age of Onset  . Emphysema Mother   . Heart attack Father   . Heart disease Daughter   . Heart disease Brother   . Arthritis Brother   . Hemophilia Brother   . Colon cancer Neg Hx    Social History   Socioeconomic History  . Marital status: Widowed    Spouse name: Not on file  . Number of children: Not on file  . Years of education: Not on file  . Highest education level: Not on  file  Occupational History  . Not on file  Social Needs  . Financial resource strain: Not on file  . Food insecurity:    Worry: Not on file    Inability: Not on file  . Transportation needs:    Medical: Not on file    Non-medical: Not on file  Tobacco Use  . Smoking status: Current Every Day Smoker    Packs/day: 1.00    Years: 40.00    Pack years: 40.00    Types: Cigarettes  . Smokeless tobacco: Former Network engineer and Sexual Activity  . Alcohol use: No  . Drug use: Yes    Types: Marijuana  . Sexual activity: Yes  Lifestyle  . Physical activity:    Days per week: Not on file    Minutes per session: Not on file  . Stress: Not on file  Relationships  . Social connections:    Talks on phone: Not on file    Gets together: Not on file    Attends  religious service: Not on file    Active member of club or organization: Not on file    Attends meetings of clubs or organizations: Not on file    Relationship status: Not on file  Other Topics Concern  . Not on file  Social History Narrative   Would desire CPR.   Does not desire long term life support or feeding tubes.          Outpatient Encounter Medications as of 01/23/2019  Medication Sig  . ALPRAZolam (XANAX) 1 MG tablet TAKE 1 TABLET THREE TIMES DAILY AS NEEDED FOR ANXIETY  . aspirin 81 MG tablet Take 81 mg by mouth daily.    . Calcium Citrate-Vitamin D 500-400 MG-UNIT CHEW Chew 1 tablet by mouth 2 (two) times daily.    . Cholecalciferol (VITAMIN D) 2000 UNITS tablet Take 5,000 Units by mouth daily.   . cyanocobalamin 500 MCG tablet Take 5,000 mcg by mouth daily.   Marland Kitchen ELIQUIS 5 MG TABS tablet TAKE 1 TABLET BY MOUTH TWICE DAILY  . fexofenadine (ALLEGRA) 180 MG tablet Take 180 mg by mouth daily as needed.  Marland Kitchen HYDROcodone-acetaminophen (NORCO) 10-325 MG tablet Take 1 tablet by mouth every 4 (four) hours as needed for up to 30 days.  . Multiple Vitamin (MULTIVITAMIN) tablet Take 1 tablet by mouth daily.    Marland Kitchen omeprazole (PRILOSEC) 20 MG capsule Take 20 mg by mouth daily.  . polyethylene glycol powder (GLYCOLAX/MIRALAX) powder Take 1 Container by mouth once. 1/2 SCOP DAILY  . simvastatin (ZOCOR) 80 MG tablet TAKE ONE TABLET BY MOUTH AT BEDTIME.  . nitroGLYCERIN (NITROSTAT) 0.3 MG SL tablet Place 1 tablet (0.3 mg total) under the tongue every 5 (five) minutes as needed. (Patient not taking: Reported on 01/23/2019)  . [DISCONTINUED] ALPRAZolam (XANAX) 1 MG tablet TAKE 1 TABLET THREE TIMES DAILY AS NEEDED FOR ANXIETY  . [DISCONTINUED] HYDROcodone-acetaminophen (NORCO) 10-325 MG tablet Take 1 tablet by mouth every 4 (four) hours as needed for severe pain.  . [DISCONTINUED] HYDROcodone-acetaminophen (NORCO) 10-325 MG tablet Take 1 tablet by mouth every 4 (four) hours as needed for severe pain.   . [DISCONTINUED] HYDROcodone-acetaminophen (NORCO) 10-325 MG tablet Take 1 tablet by mouth every 4 (four) hours as needed for up to 30 days.   No facility-administered encounter medications on file as of 01/23/2019.     Activities of Daily Living In your present state of health, do you have any difficulty performing the following activities: 01/23/2019  01/23/2019  Hearing? N N  Vision? N N  Difficulty concentrating or making decisions? N N  Walking or climbing stairs? N N  Dressing or bathing? N N  Doing errands, shopping? N N  Preparing Food and eating ? N -  Using the Toilet? N -  In the past six months, have you accidently leaked urine? N -  Do you have problems with loss of bowel control? N -  Managing your Medications? N -  Managing your Finances? N -  Housekeeping or managing your Housekeeping? N -  Some recent data might be hidden    Patient Care Team: Lucille Passy, MD as PCP - Cyndia Diver, MD as Consulting Physician (Cardiology) Luanne Bras, MD as Consulting Physician (Interventional Radiology)   Assessment:   This is a routine wellness examination for [place]. Physical assessment deferred to PCP.  Exercise Activities and Dietary recommendations Current Exercise Habits: The patient does not participate in regular exercise at present, Exercise limited by: None identified Diet (meal preparation, eat out, water intake, caffeinated beverages, dairy products, fruits and vegetables): in general, a "healthy" diet  , well balanced, on average, 3 meals per day     Goals    . DIET - EAT MORE FRUITS AND VEGETABLES    . Quit Smoking       Fall Risk Fall Risk  01/23/2019 08/20/2018 11/22/2017 11/14/2016 11/12/2015  Falls in the past year? 0 0 No No No  Follow up Falls evaluation completed Falls evaluation completed - - -    Depression Screen PHQ 2/9 Scores 01/23/2019 08/20/2018 11/22/2017 11/14/2016  PHQ - 2 Score 0 0 0 0  PHQ- 9 Score - - - 0    Cognitive  Function Ad8 score reviewed for issues:  Issues making decisions:no  Less interest in hobbies / activities:no  Repeats questions, stories (family complaining):no  Trouble using ordinary gadgets (microwave, computer, phone):no  Forgets the month or year: no  Mismanaging finances: no  Remembering appts:no  Daily problems with thinking and/or memory:no Ad8 score is=0     MMSE - Mini Mental State Exam 11/22/2017 11/12/2015  Orientation to time 5 5  Orientation to Place 5 5  Registration 3 3  Attention/ Calculation 5 0  Recall 2 3  Language- name 2 objects 2 0  Language- repeat 1 1  Language- follow 3 step command 3 3  Language- read & follow direction 1 0  Write a sentence 1 0  Copy design 1 0  Total score 29 20        Immunization History  Administered Date(s) Administered  . Influenza Split 05/04/2011, 04/30/2012  . Influenza Whole 05/15/2006  . Influenza, Quadrivalent, Recombinant, Inj, Pf 06/14/2017  . Influenza,inj,Quad PF,6+ Mos 04/29/2013, 06/05/2014, 05/12/2015, 04/24/2018  . Pneumococcal Polysaccharide-23 05/15/2004, 10/31/2012  . Td 07/23/2008  . Tdap 08/20/2018    Screening Tests Health Maintenance  Topic Date Due  . OPHTHALMOLOGY EXAM  10/23/2018  . URINE MICROALBUMIN  11/23/2018  . HEMOGLOBIN A1C  02/18/2019  . INFLUENZA VACCINE  03/16/2019  . FOOT EXAM  01/23/2020  . COLONOSCOPY  05/27/2020  . TETANUS/TDAP  08/20/2028  . PNEUMOCOCCAL POLYSACCHARIDE VACCINE AGE 52-64 HIGH RISK  Completed  . Hepatitis C Screening  Completed  . HIV Screening  Completed   Plan:   See you next year!  Continue to eat heart healthy diet (full of fruits, vegetables, whole grains, lean protein, water--limit salt, fat, and sugar intake) and increase physical activity as tolerated.  Continue doing brain stimulating activities (puzzles, reading, adult coloring books, staying active) to keep memory sharp.    I have personally reviewed and noted the following in the  patient's chart:   . Medical and social history . Use of alcohol, tobacco or illicit drugs  . Current medications and supplements . Functional ability and status . Nutritional status . Physical activity . Advanced directives . List of other physicians . Hospitalizations, surgeries, and ER visits in previous 12 months . Vitals . Screenings to include cognitive, depression, and falls . Referrals and appointments  In addition, I have reviewed and discussed with patient certain preventive protocols, quality metrics, and best practice recommendations. A written personalized care plan for preventive services as well as general preventive health recommendations were provided to patient.     Shela Nevin, [place]  01/23/2019

## 2019-01-22 NOTE — Telephone Encounter (Signed)
LMOVM for pt to call the office/needs to be prescreened for appt's on 6.10.20/thx dmf

## 2019-01-22 NOTE — Progress Notes (Signed)
Subjective:   Patient ID: [name], male    DOB: 1956-08-01, 63 y.o.   MRN: [id number]  [name] is a pleasant 63 y.o. year old male who presents to clinic today with Follow-up (Pt prescreened again at vehicle.  Pt is here today for a F/U.  He is seeing Brewing technologist at [facility] for AWV. Last A1C 1.2020 was 5.7 well controlled.  Per [place] PMP pt is compliant without red flags. CSC & UDS are UTD next due in September.)  on 01/23/2019  HPI:  Chronic pain- back, hip, knee.  Feels narcotics do still take the edge off.  Indication for chronic opioid: DDD/DJD Medication and dose Norco 10/325- 1 tab every 4 hours as needed for pain # pills per month: 150 Last UDS date: 04/24/18 Pain contract signed (Y/N): Y Date narcotic database last reviewed (include red flags): 08/17/18   Diabetes- has been diet controlled.  Lab Results  Component Value Date   HGBA1C 5.7 (A) 08/20/2018   HGBA1C 5.7 08/20/2018     Recurrent DVT/PE- since embolic stroke in 10/5359, is now on life long anticoagulation on eliquis.  HLD- He has also been compliant with Zocor 80 mg daily.  Lab Results  Component Value Date   CHOL 142 01/22/2018   HDL 44.30 01/22/2018   LDLCALC 80 01/22/2018   LDLDIRECT 54.0 02/10/2015   TRIG 90.0 01/22/2018   CHOLHDL 3 01/22/2018   Lab Results  Component Value Date   ALT 12 01/22/2018   AST 21 01/22/2018   ALKPHOS 45 01/22/2018   BILITOT 0.3 01/22/2018   Current Outpatient Medications on File Prior to Visit  Medication Sig Dispense Refill  . aspirin 81 MG tablet Take 81 mg by mouth daily.      . Calcium Citrate-Vitamin D 500-400 MG-UNIT CHEW Chew 1 tablet by mouth 2 (two) times daily.      . Cholecalciferol (VITAMIN D) 2000 UNITS tablet Take 5,000 Units by mouth daily.     . cyanocobalamin 500 MCG tablet Take 5,000 mcg by mouth daily.     Marland Kitchen ELIQUIS 5 MG TABS tablet TAKE 1 TABLET BY MOUTH TWICE DAILY 60 tablet 10  . fexofenadine (ALLEGRA) 180 MG tablet Take 180 mg by  mouth daily as needed.    . Multiple Vitamin (MULTIVITAMIN) tablet Take 1 tablet by mouth daily.      . nitroGLYCERIN (NITROSTAT) 0.3 MG SL tablet Place 1 tablet (0.3 mg total) under the tongue every 5 (five) minutes as needed. 90 tablet 3  . omeprazole (PRILOSEC) 20 MG capsule Take 20 mg by mouth daily.    . polyethylene glycol powder (GLYCOLAX/MIRALAX) powder Take 1 Container by mouth once. 1/2 SCOP DAILY    . simvastatin (ZOCOR) 80 MG tablet TAKE ONE TABLET BY MOUTH AT BEDTIME. 90 tablet 0   No current facility-administered medications on file prior to visit.     Allergies  Allergen Reactions  . Codeine     REACTION: makes me "looney"    Past Medical History:  Diagnosis Date  . Anemia   . Arthritis   . CAD (coronary artery disease)   . Diabetes mellitus   . Hyperlipidemia   . Hypertension   . Left ventricular dysfunction   . Low back pain   . MI (myocardial infarction) ([place])   . Obesity   . PE (pulmonary embolism)   . Stroke ([place])   . Tobacco abuse     Past Surgical History:  Procedure Laterality Date  .  CHOLECYSTECTOMY    . CORONARY ARTERY BYPASS GRAFT    . CORONARY STENT PLACEMENT     3 all in right side  . GASTRIC BYPASS    . HERNIA REPAIR    . IR GENERIC HISTORICAL  09/27/2016   IR ANGIO INTRA EXTRACRAN SEL COM CAROTID INNOMINATE UNI L MOD SED 09/27/2016 Luanne Bras, MD [facility]  . IR GENERIC HISTORICAL  09/27/2016   IR PERCUTANEOUS ART THROMBECTOMY/INFUSION INTRACRANIAL INC DIAG ANGIO 09/27/2016 Luanne Bras, MD [facility]  . IR GENERIC HISTORICAL  11/07/2016   IR RADIOLOGIST EVAL & MGMT 11/07/2016 [facility]  . RADIOLOGY WITH ANESTHESIA N/A 09/27/2016   Procedure: RADIOLOGY WITH ANESTHESIA;  Surgeon: Luanne Bras, MD;  Location: [place];  Service: Radiology;  Laterality: N/A;    Family History  Problem Relation Age of Onset  . Emphysema Mother   . Heart attack Father   . Heart disease Daughter   . Heart disease Brother   . Arthritis  Brother   . Hemophilia Brother   . Colon cancer Neg Hx     Social History   Socioeconomic History  . Marital status: Widowed    Spouse name: Not on file  . Number of children: Not on file  . Years of education: Not on file  . Highest education level: Not on file  Occupational History  . Not on file  Social Needs  . Financial resource strain: Not on file  . Food insecurity:    Worry: Not on file    Inability: Not on file  . Transportation needs:    Medical: Not on file    Non-medical: Not on file  Tobacco Use  . Smoking status: Current Every Day Smoker    Packs/day: 1.00    Years: 40.00    Pack years: 40.00    Types: Cigarettes  . Smokeless tobacco: Former Network engineer and Sexual Activity  . Alcohol use: No  . Drug use: Yes    Types: Marijuana  . Sexual activity: Yes  Lifestyle  . Physical activity:    Days per week: Not on file    Minutes per session: Not on file  . Stress: Not on file  Relationships  . Social connections:    Talks on phone: Not on file    Gets together: Not on file    Attends religious service: Not on file    Active member of club or organization: Not on file    Attends meetings of clubs or organizations: Not on file    Relationship status: Not on file  . Intimate partner violence:    Fear of current or ex partner: Not on file    Emotionally abused: Not on file    Physically abused: Not on file    Forced sexual activity: Not on file  Other Topics Concern  . Not on file  Social History Narrative   Would desire CPR.   Does not desire long term life support or feeding tubes.         The PMH, PSH, Social History, Family History, Medications, and allergies have been reviewed in [facility], and have been updated if relevant.    Review of Systems  HENT: Negative.   Eyes: Negative.   Respiratory: Negative.   Cardiovascular: Negative.   Gastrointestinal: Negative.   Endocrine: Negative.   Genitourinary: Negative.   Musculoskeletal:  Negative.   Allergic/Immunologic: Negative.   Neurological: Negative.   Hematological: Negative.   Psychiatric/Behavioral: Negative.   All  other systems reviewed and are negative.      Objective:    BP 126/74 (BP Location: Left Arm, Patient Position: Sitting, Cuff Size: Normal)   Pulse 61   Temp (!) 97.5 F (36.4 C) (Oral)   Ht '5\' 10"'  (1.778 m)   Wt 183 lb 12.8 oz (83.4 kg)   SpO2 95%   BMI 26.37 kg/m   Wt Readings from Last 3 Encounters:  01/23/19 183 lb 12.8 oz (83.4 kg)  08/20/18 175 lb 3.2 oz (79.5 kg)  04/24/18 170 lb (77.1 kg)    Physical Exam  Constitutional: He is oriented to person, place, and time. He appears well-developed and well-nourished. No distress.  HENT:  Head: Normocephalic and atraumatic.  Eyes: Conjunctivae are normal.  Neck: Normal range of motion.  Cardiovascular: Normal rate.  Pulmonary/Chest: Effort normal and breath sounds normal.  Abdominal: Soft.  Musculoskeletal: Normal range of motion.        General: No edema.  Neurological: He is alert and oriented to person, place, and time. No cranial nerve deficit.  Skin: Skin is warm and dry. He is not diaphoretic.  Psychiatric: He has a normal mood and affect. His behavior is normal. Judgment and thought content normal.  Nursing note and vitals reviewed.         Assessment & Plan:   Diabetes mellitus type 2, diet-controlled ([place]) - Plan: Urine Microalbumin w/creat. ratio, Comp Met (CMET), HgB A1c  Hyperlipidemia, unspecified hyperlipidemia type - Plan: Urine Microalbumin w/creat. ratio, Comp Met (CMET), CBC w/Diff, Lipid Profile, HgB A1c  Vitamin D deficiency - Plan: VITAMIN D 25 Hydroxy (Vit-D Deficiency, Fractures)  Essential hypertension - Plan: Urine Microalbumin w/creat. 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

## 2019-01-23 ENCOUNTER — Encounter: Payer: Self-pay | Admitting: *Deleted

## 2019-01-23 ENCOUNTER — Ambulatory Visit (INDEPENDENT_AMBULATORY_CARE_PROVIDER_SITE_OTHER): Payer: Medicare HMO | Admitting: *Deleted

## 2019-01-23 ENCOUNTER — Ambulatory Visit (INDEPENDENT_AMBULATORY_CARE_PROVIDER_SITE_OTHER): Payer: Medicare HMO | Admitting: Family Medicine

## 2019-01-23 ENCOUNTER — Encounter: Payer: Self-pay | Admitting: Family Medicine

## 2019-01-23 VITALS — BP 126/74 | HR 61 | Temp 97.5°F | Ht 70.0 in | Wt 183.0 lb

## 2019-01-23 VITALS — BP 126/74 | HR 61 | Temp 97.5°F | Ht 70.0 in | Wt 183.8 lb

## 2019-01-23 DIAGNOSIS — G894 Chronic pain syndrome: Secondary | ICD-10-CM | POA: Diagnosis not present

## 2019-01-23 DIAGNOSIS — Z125 Encounter for screening for malignant neoplasm of prostate: Secondary | ICD-10-CM

## 2019-01-23 DIAGNOSIS — I25700 Atherosclerosis of coronary artery bypass graft(s), unspecified, with unstable angina pectoris: Secondary | ICD-10-CM

## 2019-01-23 DIAGNOSIS — I2699 Other pulmonary embolism without acute cor pulmonale: Secondary | ICD-10-CM | POA: Diagnosis not present

## 2019-01-23 DIAGNOSIS — E119 Type 2 diabetes mellitus without complications: Secondary | ICD-10-CM | POA: Diagnosis not present

## 2019-01-23 DIAGNOSIS — Z0000 Encounter for general adult medical examination without abnormal findings: Secondary | ICD-10-CM

## 2019-01-23 DIAGNOSIS — E559 Vitamin D deficiency, unspecified: Secondary | ICD-10-CM

## 2019-01-23 DIAGNOSIS — E785 Hyperlipidemia, unspecified: Secondary | ICD-10-CM

## 2019-01-23 DIAGNOSIS — I10 Essential (primary) hypertension: Secondary | ICD-10-CM | POA: Diagnosis not present

## 2019-01-23 MED ORDER — HYDROCODONE-ACETAMINOPHEN 10-325 MG PO TABS: 1.0000 | ORAL_TABLET | ORAL | 0 refills | Status: DC | PRN

## 2019-01-23 MED ORDER — ALPRAZOLAM 1 MG PO TABS: ORAL_TABLET | ORAL | 5 refills | Status: DC

## 2019-01-23 NOTE — Assessment & Plan Note (Signed)
Has been well controlled on current statin but will check lipid panel today.

## 2019-01-23 NOTE — Assessment & Plan Note (Signed)
On eliquis for lifelong anticoagulation.

## 2019-01-23 NOTE — Assessment & Plan Note (Signed)
Likely secondary to gastric bypass. Due for vit D today. Does take daily OTC vitamin D.

## 2019-01-23 NOTE — Progress Notes (Signed)
I reviewed health advisor's note, was available for consultation, and agree with documentation and plan.  

## 2019-01-23 NOTE — Telephone Encounter (Signed)
Questions for Screening COVID-19  Symptom onset: None  Travel or Contacts: None  During this illness, did/does the patient experience any of the following symptoms? Fever >100.42F []   Yes [x]   No []   Unknown Subjective fever (felt feverish) []   Yes [x]   No []   Unknown Chills []   Yes [x]   No []   Unknown Muscle aches (myalgia) []   Yes [x]   No []   Unknown Runny nose (rhinorrhea) []   Yes [x]   No []   Unknown Sore throat []   Yes [x]   No []   Unknown Cough (new onset or worsening of chronic cough) []   Yes [x]   No []   Unknown Shortness of breath (dyspnea) []   Yes [x]   No []   Unknown Nausea or vomiting []   Yes [x]   No []   Unknown Headache []   Yes [x]   No []   Unknown Abdominal pain  []   Yes []   No [x]   Unknown Diarrhea (?3 loose/looser than normal stools/24hr period) []   Yes [x]   No []   Unknown Other, specify:  Patient risk factors: Smoker? []   Current []   Former []   Never If male, currently pregnant? []   Yes []   No  Patient Active Problem List   Diagnosis Date Noted  . Opioid type dependence, continuous ([place]) 07/20/2017  . History of pulmonary embolism 04/20/2017  . Hx of CABG 04/20/2017  . GERD (gastroesophageal reflux disease) 01/03/2017  . Left carotid stenosis 09/30/2016  . Recurrent pulmonary emboli ([place]) 09/30/2016  . Deep vein thrombosis (DVT) of popliteal vein of left lower extremity ([place]) 09/30/2016  . CAD (coronary artery disease) 09/30/2016  . Tetrahydrocannabinol (THC) use disorder, mild, abuse 09/30/2016  . Cerebrovascular accident (CVA) due to embolism of right middle cerebral artery ([name]) - s/ IV tPA and mechanical thrombectomy   . CVA (cerebral vascular accident) ([place]) 09/27/2016  . Generalized anxiety disorder 02/09/2016  . Constipation 01/08/2015  . Other nonspecific abnormal finding of lung field 09/02/2013  . Chronic pain syndrome 09/02/2013  . Vitamin M[place] deficiency 09/03/2009  . CAD, ARTERY BYPASS GRAFT 05/20/2009  . Diabetes mellitus type 2, diet-controlled  ([place]) 12/11/2006  . Hyperlipidemia 12/11/2006  . Iron deficiency anemia 12/11/2006  . TOBACCO ABUSE 12/11/2006  . Essential hypertension 12/11/2006  . MYOCARDIAL INFARCTION, HX OF 12/11/2006  . CARDIOMYOPATHY, ISCHEMIC 12/11/2006  . PULMONARY EMBOLISM, HX OF 12/11/2006    Plan:  []   High risk for COVID-19 with red flags go to ED (with CP, SOB, weak/lightheaded, or fever > 101.5). Call ahead.  []   High risk for COVID-19 but stable. Inform provider and coordinate time for [facility] visit.   []   No red flags but URI signs or symptoms okay for [facility] visit.

## 2019-01-23 NOTE — Assessment & Plan Note (Addendum)
Has been diet controlled. Orders Placed This Encounter  Procedures  . Urine Microalbumin w/creat. ratio  . Comp Met (CMET)  . CBC w/Diff  . Lipid Profile  . PSA  . VITAMIN D 25 Hydroxy (Vit-D Deficiency, Fractures)  . HgB A1c

## 2019-01-23 NOTE — Patient Instructions (Signed)
See you next year!  Continue to eat heart healthy diet (full of fruits, vegetables, whole grains, lean protein, water--limit salt, fat, and sugar intake) and increase physical activity as tolerated.  Continue doing brain stimulating activities (puzzles, reading, adult coloring books, staying active) to keep memory sharp.    [name] , Thank you for taking time to come for your Medicare Wellness Visit. I appreciate your ongoing commitment to your health goals. Please review the following plan we discussed and let me know if I can assist you in the future.   These are the goals we discussed: Goals    . DIET - EAT MORE FRUITS AND VEGETABLES    . Quit Smoking       This is a list of the screening recommended for you and due dates:  Health Maintenance  Topic Date Due  . Eye exam for diabetics  10/23/2018  . Urine Protein Check  11/23/2018  . Hemoglobin A1C  02/18/2019  . Flu Shot  03/16/2019  . Complete foot exam   01/23/2020  . Colon Cancer Screening  05/27/2020  . Tetanus Vaccine  08/20/2028  . Pneumococcal vaccine  Completed  .  Hepatitis C: One time screening is recommended by Center for Disease Control  (CDC) for  adults born from [birth year] through 1964.   Completed  . HIV Screening  Completed    Health Maintenance After Age 59 After age 80, you are at a higher risk for certain long-term diseases and infections as well as injuries from falls. Falls are a major cause of broken bones and head injuries in people who are older than age 13. Getting regular preventive care can help to keep you healthy and well. Preventive care includes getting regular testing and making lifestyle changes as recommended by your health care provider. Talk with your health care provider about:  Which screenings and tests you should have. A screening is a test that checks for a disease when you have no symptoms.  A diet and exercise plan that is right for you. What should I know about screenings and tests to  prevent falls? Screening and testing are the best ways to find a health problem early. Early diagnosis and treatment give you the best chance of managing medical conditions that are common after age 3. Certain conditions and lifestyle choices may make you more likely to have a fall. Your health care provider may recommend:  Regular vision checks. Poor vision and conditions such as cataracts can make you more likely to have a fall. If you wear glasses, make sure to get your prescription updated if your vision changes.  Medicine review. Work with your health care provider to regularly review all of the medicines you are taking, including over-the-counter medicines. Ask your health care provider about any side effects that may make you more likely to have a fall. Tell your health care provider if any medicines that you take make you feel dizzy or sleepy.  Osteoporosis screening. Osteoporosis is a condition that causes the bones to get weaker. This can make the bones weak and cause them to break more easily.  Blood pressure screening. Blood pressure changes and medicines to control blood pressure can make you feel dizzy.  Strength and balance checks. Your health care provider may recommend certain tests to check your strength and balance while standing, walking, or changing positions.  Foot health exam. Foot pain and numbness, as well as not wearing proper footwear, can make you more likely to  have a fall.  Depression screening. You may be more likely to have a fall if you have a fear of falling, feel emotionally low, or feel unable to do activities that you used to do.  Alcohol use screening. Using too much alcohol can affect your balance and may make you more likely to have a fall. What actions can I take to lower my risk of falls? General instructions  Talk with your health care provider about your risks for falling. Tell your health care provider if: ? You fall. Be sure to tell your health  care provider about all falls, even ones that seem minor. ? You feel dizzy, sleepy, or off-balance.  Take over-the-counter and prescription medicines only as told by your health care provider. These include any supplements.  Eat a healthy diet and maintain a healthy weight. A healthy diet includes low-fat dairy products, low-fat (lean) meats, and fiber from whole grains, beans, and lots of fruits and vegetables. Home safety  Remove any tripping hazards, such as rugs, cords, and clutter.  Install safety equipment such as grab bars in bathrooms and safety rails on stairs.  Keep rooms and walkways well-lit. Activity   Follow a regular exercise program to stay fit. This will help you maintain your balance. Ask your health care provider what types of exercise are appropriate for you.  If you need a cane or walker, use it as recommended by your health care provider.  Wear supportive shoes that have nonskid soles. Lifestyle  Do not drink alcohol if your health care provider tells you not to drink.  If you drink alcohol, limit how much you have: ? 0-1 drink a day for women. ? 0-2 drinks a day for men.  Be aware of how much alcohol is in your drink. In the [place] one drink equals one typical bottle of beer (12 oz), one-half glass of wine (5 oz), or one shot of hard liquor (1 oz).  Do not use any products that contain nicotine or tobacco, such as cigarettes and e-cigarettes. If you need help quitting, ask your health care provider. Summary  Having a healthy lifestyle and getting preventive care can help to protect your health and wellness after age 65.  Screening and testing are the best way to find a health problem early and help you avoid having a fall. Early diagnosis and treatment give you the best chance for managing medical conditions that are more common for people who are older than age 37.  Falls are a major cause of broken bones and head injuries in people who are older than age  26. Take precautions to prevent a fall at home.  Work with your health care provider to learn what changes you can make to improve your health and wellness and to prevent falls. This information is not intended to replace advice given to you by your health care provider. Make sure you discuss any questions you have with your health care provider. Document Released: 06/14/2017 Document Revised: 06/14/2017 Document Reviewed: 06/14/2017 Elsevier Interactive Patient Education  2019 [employer].

## 2019-01-23 NOTE — Assessment & Plan Note (Addendum)
On eliquis and statin.  Has NTG to use as needed.  Remains asymptomatic.   No changes made today.

## 2019-01-23 NOTE — Assessment & Plan Note (Signed)
Normotensive.  No changes made to rxs.

## 2019-01-23 NOTE — Assessment & Plan Note (Signed)
CSC & UDS are UTD next due in September- he is compliant- PDMP reviewed. eRx refills sent.

## 2019-01-23 NOTE — Patient Instructions (Signed)
Great to see you. I will call you with your lab results from today and you can view them online.   

## 2019-01-24 LAB — CBC WITH DIFFERENTIAL/PLATELET
Absolute Monocytes: 470 cells/uL (ref 200–950)
Basophils Absolute: 72 cells/uL (ref 0–200)
Basophils Relative: 1.5 %
Eosinophils Absolute: 182 cells/uL (ref 15–500)
Eosinophils Relative: 3.8 %
HCT: 29.7 % — ABNORMAL LOW (ref 38.5–50.0)
Hemoglobin: 8.9 g/dL — ABNORMAL LOW (ref 13.2–17.1)
Lymphs Abs: 2194 cells/uL (ref 850–3900)
MCH: 22.4 pg — ABNORMAL LOW (ref 27.0–33.0)
MCHC: 30 g/dL — ABNORMAL LOW (ref 32.0–36.0)
MCV: 74.8 fL — ABNORMAL LOW (ref 80.0–100.0)
MPV: 10 fL (ref 7.5–12.5)
Monocytes Relative: 9.8 %
Neutro Abs: 1882 cells/uL (ref 1500–7800)
Neutrophils Relative %: 39.2 %
Platelets: 331 10*3/uL (ref 140–400)
RBC: 3.97 10*6/uL — ABNORMAL LOW (ref 4.20–5.80)
RDW: 15.8 % — ABNORMAL HIGH (ref 11.0–15.0)
Total Lymphocyte: 45.7 %
WBC: 4.8 10*3/uL (ref 3.8–10.8)

## 2019-01-24 LAB — COMPREHENSIVE METABOLIC PANEL
AG Ratio: 1.4 (calc) (ref 1.0–2.5)
ALT: 7 U/L — ABNORMAL LOW (ref 9–46)
AST: 14 U/L (ref 10–35)
Albumin: 4 g/dL (ref 3.6–5.1)
Alkaline phosphatase (APISO): 47 U/L (ref 35–144)
BUN: 14 mg/dL (ref 7–25)
CO2: 28 mmol/L (ref 20–32)
Calcium: 9.1 mg/dL (ref 8.6–10.3)
Chloride: 105 mmol/L (ref 98–110)
Creat: 1.24 mg/dL (ref 0.70–1.25)
Globulin: 2.9 g/dL (calc) (ref 1.9–3.7)
Glucose, Bld: 102 mg/dL — ABNORMAL HIGH (ref 65–99)
Potassium: 4.9 mmol/L (ref 3.5–5.3)
Sodium: 141 mmol/L (ref 135–146)
Total Bilirubin: 0.4 mg/dL (ref 0.2–1.2)
Total Protein: 6.9 g/dL (ref 6.1–8.1)

## 2019-01-24 LAB — MICROALBUMIN / CREATININE URINE RATIO
Creatinine, Urine: 83 mg/dL (ref 20–320)
Microalb Creat Ratio: 7 mcg/mg creat (ref <30)
Microalb, Ur: 0.6 mg/dL

## 2019-01-24 LAB — LIPID PANEL
Cholesterol: 140 mg/dL (ref <200)
HDL: 52 mg/dL (ref >=40)
LDL Cholesterol (Calc): 74 mg/dL (calc)
Non-HDL Cholesterol (Calc): 88 mg/dL (calc) (ref <130)
Total CHOL/HDL Ratio: 2.7 (calc) (ref <5.0)
Triglycerides: 63 mg/dL (ref <150)

## 2019-01-24 LAB — VITAMIN D 25 HYDROXY (VIT D DEFICIENCY, FRACTURES): Vit D, 25-Hydroxy: 77 ng/mL (ref 30–100)

## 2019-01-24 LAB — HEMOGLOBIN A1C
Hgb A1c MFr Bld: 5.7 % of total Hgb — ABNORMAL HIGH (ref <5.7)
Mean Plasma Glucose: 117 (calc)
eAG (mmol/L): 6.5 (calc)

## 2019-01-24 LAB — PSA: PSA: 0.6 ng/mL (ref <=4.0)

## 2019-01-25 ENCOUNTER — Other Ambulatory Visit: Payer: Self-pay | Admitting: Family Medicine

## 2019-01-25 DIAGNOSIS — D509 Iron deficiency anemia, unspecified: Secondary | ICD-10-CM

## 2019-01-25 MED ORDER — FERROUS SULFATE 300 (60 FE) MG/5ML PO SYRP: 300.0000 mg | ORAL_SOLUTION | Freq: Every day | ORAL | 3 refills | Status: DC

## 2019-01-30 ENCOUNTER — Telehealth: Payer: Self-pay

## 2019-01-30 NOTE — Telephone Encounter (Signed)
TA-Pt is taking Eliquis/is wondering about that medication because a SE if anemia so he is wondering what to do if that is the situation?I also advised him to call [facility] since it is ready to schedule/Plz advise your thoughts on the Eliquis/thx dmf

## 2019-01-30 NOTE — Telephone Encounter (Signed)
Pt aware/thx dmf 

## 2019-01-30 NOTE — Telephone Encounter (Signed)
Copied from [place] [id number]. Topic: General - Inquiry >> Jan 30, 2019  8:02 AM Richardo Priest, NT wrote: Reason for CRM: Patient calling in stating he would like clarification on his eliquis, due to it causing some side effects, as well as him starting his iron. Please advise. Patient is also wanting to know where we are in the process of scheduling his colonoscopy. Call back is [phone].

## 2019-01-30 NOTE — Telephone Encounter (Signed)
Yes it can occur with any blood thinners but we really need to make sure there isn't something else going on since his anemia keeps worsening.

## 2019-02-22 ENCOUNTER — Other Ambulatory Visit: Payer: Self-pay | Admitting: Family Medicine

## 2019-02-22 MED ORDER — HYDROCODONE-ACETAMINOPHEN 10-325 MG PO TABS: 1.0000 | ORAL_TABLET | ORAL | 0 refills | Status: DC | PRN

## 2019-02-27 ENCOUNTER — Telehealth: Payer: Self-pay

## 2019-02-27 NOTE — Telephone Encounter (Signed)
Covid-19 screening questions   Do you now or have you had a fever in the last 14 days? No  Do you have any respiratory symptoms of shortness of breath or cough now or in the last 14 days? No  Do you have any family members or close contacts with diagnosed or suspected Covid-19 in the past 14 days? No  Have you been tested for Covid-19 and found to be positive? no        

## 2019-03-01 ENCOUNTER — Ambulatory Visit: Payer: Medicare HMO | Admitting: Gastroenterology

## 2019-03-01 ENCOUNTER — Telehealth: Payer: Self-pay

## 2019-03-01 ENCOUNTER — Other Ambulatory Visit (INDEPENDENT_AMBULATORY_CARE_PROVIDER_SITE_OTHER): Payer: Medicare HMO

## 2019-03-01 ENCOUNTER — Encounter: Payer: Self-pay | Admitting: Gastroenterology

## 2019-03-01 ENCOUNTER — Other Ambulatory Visit: Payer: Self-pay

## 2019-03-01 VITALS — BP 124/80 | HR 70 | Temp 97.7°F | Ht 72.0 in | Wt 177.0 lb

## 2019-03-01 DIAGNOSIS — Z86010 Personal history of colonic polyps: Secondary | ICD-10-CM

## 2019-03-01 DIAGNOSIS — D509 Iron deficiency anemia, unspecified: Secondary | ICD-10-CM

## 2019-03-01 DIAGNOSIS — K219 Gastro-esophageal reflux disease without esophagitis: Secondary | ICD-10-CM

## 2019-03-01 DIAGNOSIS — Z7901 Long term (current) use of anticoagulants: Secondary | ICD-10-CM

## 2019-03-01 LAB — FOLATE: Folate: >23.9 ng/mL (ref >5.9)

## 2019-03-01 LAB — IBC + FERRITIN
Ferritin: 34.3 ng/mL (ref 22.0–322.0)
Iron: 73 ug/dL (ref 42–165)
Saturation Ratios: 19.2 % — ABNORMAL LOW (ref 20.0–50.0)
Transferrin: 271 mg/dL (ref 212.0–360.0)

## 2019-03-01 LAB — VITAMIN B12: Vitamin B-12: >1500 pg/mL — ABNORMAL HIGH (ref 211–911)

## 2019-03-01 NOTE — Telephone Encounter (Signed)
Very very kind man.  My only concern is that he has a h/o CAD s/p CABG and should be cleared by his cardiologist from a cardiac standpoint.  If it's just for colonoscopy/endoscopy and we're asking about holding eliquis in case biopsies are taken, then I can clear him from that standpoint.

## 2019-03-01 NOTE — Telephone Encounter (Signed)
It is for a Upper Endoscopy and Colonoscopy to evaluate his anemia. We may remove polyps if needed. Are you giving clearance to hold Eliquis x 2 days?

## 2019-03-01 NOTE — Progress Notes (Signed)
History of Present Illness: This is a 63 year old male referred by Lucille Passy, MD for the evaluation of microcytic anemia, Hb=8.9, MCV=74.8. S/P gastric bypass around 2007. GERD symptoms controlled on omeprazole.  Mild constipation controlled on MiraLAX.  No other GI complaints. Colonoscopy in 2016 showed 1 small tubular adenoma. Denies weight loss, abdominal pain, diarrhea, change in stool caliber, melena, hematochezia, nausea, vomiting, dysphagia, chest pain.   Allergies  Allergen Reactions  . Codeine     REACTION: makes me "looney"   Outpatient Medications Prior to Visit  Medication Sig Dispense Refill  . ALPRAZolam (XANAX) 1 MG tablet TAKE 1 TABLET THREE TIMES DAILY AS NEEDED FOR ANXIETY 90 tablet 5  . aspirin 81 MG tablet Take 81 mg by mouth daily.      . Calcium Citrate-Vitamin D 500-400 MG-UNIT CHEW Chew 1 tablet by mouth 2 (two) times daily.      . Cholecalciferol (VITAMIN D) 2000 UNITS tablet Take 5,000 Units by mouth daily.     . cyanocobalamin 500 MCG tablet Take 5,000 mcg by mouth daily.     Marland Kitchen ELIQUIS 5 MG TABS tablet TAKE 1 TABLET BY MOUTH TWICE DAILY 60 tablet 10  . ferrous sulfate 300 (60 Fe) MG/5ML syrup Take 5 mLs (300 mg total) by mouth daily. 150 mL 3  . fexofenadine (ALLEGRA) 180 MG tablet Take 180 mg by mouth daily as needed.    Marland Kitchen HYDROcodone-acetaminophen (NORCO) 10-325 MG tablet Take 1 tablet by mouth every 4 (four) hours as needed. 150 tablet 0  . Multiple Vitamin (MULTIVITAMIN) tablet Take 1 tablet by mouth daily.      . nitroGLYCERIN (NITROSTAT) 0.3 MG SL tablet Place 1 tablet (0.3 mg total) under the tongue every 5 (five) minutes as needed. 90 tablet 3  . omeprazole (PRILOSEC) 20 MG capsule Take 20 mg by mouth daily.    . polyethylene glycol powder (GLYCOLAX/MIRALAX) powder Take 1 Container by mouth once. 1/2 SCOP DAILY    . simvastatin (ZOCOR) 80 MG tablet TAKE ONE TABLET BY MOUTH AT BEDTIME. 90 tablet 0   No facility-administered medications prior to  visit.    Past Medical History:  Diagnosis Date  . Anemia   . Arthritis   . CAD (coronary artery disease)   . Diabetes mellitus   . Hyperlipidemia   . Hypertension   . Left ventricular dysfunction   . Low back pain   . MI (myocardial infarction) ([place])   . Obesity   . PE (pulmonary embolism)   . Stroke ([place])   . Tobacco abuse   . Tubular adenoma of colon 05/2015   Past Surgical History:  Procedure Laterality Date  . CHOLECYSTECTOMY    . CORONARY ARTERY BYPASS GRAFT    . CORONARY STENT PLACEMENT     3 all in right side  . GASTRIC BYPASS    . HERNIA REPAIR    . IR GENERIC HISTORICAL  09/27/2016   IR ANGIO INTRA EXTRACRAN SEL COM CAROTID INNOMINATE UNI L MOD SED 09/27/2016 Luanne Bras, MD [facility]  . IR GENERIC HISTORICAL  09/27/2016   IR PERCUTANEOUS ART THROMBECTOMY/INFUSION INTRACRANIAL INC DIAG ANGIO 09/27/2016 Luanne Bras, MD [facility]  . IR GENERIC HISTORICAL  11/07/2016   IR RADIOLOGIST EVAL & MGMT 11/07/2016 [facility]  . RADIOLOGY WITH ANESTHESIA N/A 09/27/2016   Procedure: RADIOLOGY WITH ANESTHESIA;  Surgeon: Luanne Bras, MD;  Location: [facility];  Service: Radiology;  Laterality: N/A;   Social History  Socioeconomic History  . Marital status: Widowed    Spouse name: Not on file  . Number of children: Not on file  . Years of education: Not on file  . Highest education level: Not on file  Occupational History  . Not on file  Social Needs  . Financial resource strain: Not on file  . Food insecurity    Worry: Not on file    Inability: Not on file  . Transportation needs    Medical: Not on file    Non-medical: Not on file  Tobacco Use  . Smoking status: Current Every Day Smoker    Packs/day: 1.00    Years: 40.00    Pack years: 40.00    Types: Cigarettes  . Smokeless tobacco: Former Network engineer and Sexual Activity  . Alcohol use: No  . Drug use: Yes    Types: Marijuana  . Sexual activity: Yes  Lifestyle  . Physical activity     Days per week: Not on file    Minutes per session: Not on file  . Stress: Not on file  Relationships  . Social Herbalist on phone: Not on file    Gets together: Not on file    Attends religious service: Not on file    Active member of club or organization: Not on file    Attends meetings of clubs or organizations: Not on file    Relationship status: Not on file  Other Topics Concern  . Not on file  Social History Narrative   Would desire CPR.   Does not desire long term life support or feeding tubes.         Family History  Problem Relation Age of Onset  . Emphysema Mother   . Heart attack Father   . Heart disease Daughter   . Heart disease Brother   . Arthritis Brother   . Hemophilia Brother   . Colon cancer Neg Hx        Review of Systems: Pertinent positive and negative review of systems were noted in the above HPI section. All other review of systems were otherwise negative.   Physical Exam: General: Well developed, well nourished, no acute distress Head: Normocephalic and atraumatic Eyes:  sclerae anicteric, EOMI Ears: Normal auditory acuity Mouth: No deformity or lesions Neck: Supple, no masses or thyromegaly Lungs: Clear throughout to auscultation Heart: Regular rate and rhythm; no murmurs, rubs or bruits Abdomen: Soft, non tender and non distended. No masses, hepatosplenomegaly or hernias noted. Normal Bowel sounds Rectal: Deferred to colonoscopy Musculoskeletal: Symmetrical with no gross deformities  Skin: No lesions on visible extremities Pulses:  Normal pulses noted Extremities: No clubbing, cyanosis, edema or deformities noted Neurological: Alert oriented x 4, grossly nonfocal Cervical Nodes:  No significant cervical adenopathy Inguinal Nodes: No significant inguinal adenopathy Psychological:  Alert and cooperative. Normal mood and affect   Assessment and Recommendations:  1. Microcytic anemia, presumed iron deficiency.  Status post  gastric bypass.  Obtain iron studies, B12 and folate.  R/O ulcer, colorectal neoplasm, AVM, poor iron absorption post gastric bypass. Continue Fe daily. Schedule colonoscopy and EGD. The risks (including bleeding, perforation, infection, missed lesions, medication reactions and possible hospitalization or surgery if complications occur), benefits, and alternatives to colonoscopy with possible biopsy and possible polypectomy were discussed with the patient and they consent to proceed. The risks (including bleeding, perforation, infection, missed lesions, medication reactions and possible hospitalization or surgery if complications occur), benefits, and alternatives to  endoscopy with possible biopsy and possible dilation were discussed with the patient and they consent to proceed.  Patient requests MiraLAX / Gatorade prep.   2. GERD.  Continue omeprazole 20 mg daily and follow standard antireflux measures.  3. Personal history of adenomatous colon polyps.  Colonoscopy as above.  4. S/P CVA, PE. Hold Eliquis 2 days before procedure - will instruct when and how to resume after procedure. Low but real risk of cardiovascular event such as heart attack, stroke, embolism, thrombosis or ischemia/infarct of other organs off Eliquis explained and need to seek urgent help if this occurs. The patient consents to proceed. Will communicate by phone or EMR with patient's prescribing provider to confirm that holding Eliquis is reasonable in this case.     cc: Lucille Passy, MD [address] [place],  [place] [address]

## 2019-03-01 NOTE — Telephone Encounter (Signed)
[facility] Pre-operative Risk Assessment     Request for surgical clearance:     Endoscopy Procedure  What type of surgery is being performed?     EGD/Colon  When is this surgery scheduled?     03/18/19  What type of clearance is required ?   Pharmacy  Are there any medications that need to be held prior to surgery and how long? Eliquis x 2 days  Practice name and name of physician performing surgery?      [facility]  What is your office phone and fax number?      Phone- [phone]  [phone]  Anesthesia type (None, local, MAC, general) ?       MAC

## 2019-03-01 NOTE — Telephone Encounter (Signed)
Informed patient he can hold Eliquis 2 days prior to his procedure per Dr. Deborra Medina. Patient verbalized understanding.

## 2019-03-01 NOTE — Telephone Encounter (Signed)
Yes I am giving you clearance to hold Eliquis for 2 days.  Thanks so much for taking care of our patients!

## 2019-03-01 NOTE — Patient Instructions (Signed)
Your provider has requested that you go to the basement level for lab work before leaving today. Press "B" on the elevator. The lab is located at the first door on the left as you exit the elevator.  You have been scheduled for an endoscopy and colonoscopy. Please follow the written instructions given to you at your visit today. Please pick up your prep supplies at the pharmacy within the next 1-3 days. If you use inhalers (even only as needed), please bring them with you on the day of your procedure. Your physician has requested that you go to www.startemmi.com and enter the access code given to you at your visit today. This web site gives a general overview about your procedure. However, you should still follow specific instructions given to you by our office regarding your preparation for the procedure.  Normal BMI (Body Mass Index- based on height and weight) is between 19 and 25. Your BMI today is Body mass index is 24.01 kg/m. Marland Kitchen Please consider follow up  regarding your BMI with your Primary Care Provider.  Thank you for choosing me and [facility].  Pricilla Riffle. Dagoberto Ligas., MD., Marval Regal

## 2019-03-15 ENCOUNTER — Telehealth: Payer: Self-pay | Admitting: Gastroenterology

## 2019-03-15 NOTE — Telephone Encounter (Signed)

## 2019-03-18 ENCOUNTER — Ambulatory Visit (AMBULATORY_SURGERY_CENTER): Payer: Medicare HMO | Admitting: Gastroenterology

## 2019-03-18 ENCOUNTER — Encounter: Payer: Self-pay | Admitting: Gastroenterology

## 2019-03-18 ENCOUNTER — Other Ambulatory Visit: Payer: Self-pay

## 2019-03-18 VITALS — BP 104/63 | HR 43 | Temp 98.9°F | Resp 18 | Ht 72.0 in | Wt 177.0 lb

## 2019-03-18 DIAGNOSIS — K635 Polyp of colon: Secondary | ICD-10-CM

## 2019-03-18 DIAGNOSIS — Z86010 Personal history of colonic polyps: Secondary | ICD-10-CM

## 2019-03-18 DIAGNOSIS — D125 Benign neoplasm of sigmoid colon: Secondary | ICD-10-CM

## 2019-03-18 DIAGNOSIS — K219 Gastro-esophageal reflux disease without esophagitis: Secondary | ICD-10-CM | POA: Diagnosis not present

## 2019-03-18 DIAGNOSIS — D509 Iron deficiency anemia, unspecified: Secondary | ICD-10-CM

## 2019-03-18 DIAGNOSIS — K319 Disease of stomach and duodenum, unspecified: Secondary | ICD-10-CM | POA: Diagnosis not present

## 2019-03-18 DIAGNOSIS — E119 Type 2 diabetes mellitus without complications: Secondary | ICD-10-CM | POA: Diagnosis not present

## 2019-03-18 DIAGNOSIS — D123 Benign neoplasm of transverse colon: Secondary | ICD-10-CM

## 2019-03-18 DIAGNOSIS — D649 Anemia, unspecified: Secondary | ICD-10-CM | POA: Diagnosis not present

## 2019-03-18 DIAGNOSIS — I2510 Atherosclerotic heart disease of native coronary artery without angina pectoris: Secondary | ICD-10-CM | POA: Diagnosis not present

## 2019-03-18 DIAGNOSIS — I252 Old myocardial infarction: Secondary | ICD-10-CM | POA: Diagnosis not present

## 2019-03-18 MED ORDER — SODIUM CHLORIDE 0.9 % IV SOLN: 500.0000 mL | Freq: Once | INTRAVENOUS | Status: DC

## 2019-03-18 NOTE — Progress Notes (Signed)
Called to room to assist during endoscopic procedure.  Patient ID and intended procedure confirmed with present staff. Received instructions for my participation in the procedure from the performing physician.  

## 2019-03-18 NOTE — Patient Instructions (Signed)
Information on polyps and hemorrhoids given to you today.  Await pathology results.  Resume Eliquis at prior dose tomorrow.    YOU HAD AN ENDOSCOPIC PROCEDURE TODAY AT [facility]:   Refer to the procedure report that was given to you for any specific questions about what was found during the examination.  If the procedure report does not answer your questions, please call your gastroenterologist to clarify.  If you requested that your care partner not be given the details of your procedure findings, then the procedure report has been included in a sealed envelope for you to review at your convenience later.  YOU SHOULD EXPECT: Some feelings of bloating in the abdomen. Passage of more gas than usual.  Walking can help get rid of the air that was put into your GI tract during the procedure and reduce the bloating. If you had a lower endoscopy (such as a colonoscopy or flexible sigmoidoscopy) you may notice spotting of blood in your stool or on the toilet paper. If you underwent a bowel prep for your procedure, you may not have a normal bowel movement for a few days.  Please Note:  You might notice some irritation and congestion in your nose or some drainage.  This is from the oxygen used during your procedure.  There is no need for concern and it should clear up in a day or so.  SYMPTOMS TO REPORT IMMEDIATELY:   Following lower endoscopy (colonoscopy or flexible sigmoidoscopy):  Excessive amounts of blood in the stool  Significant tenderness or worsening of abdominal pains  Swelling of the abdomen that is new, acute  Fever of 100F or higher   Following upper endoscopy (EGD)  Vomiting of blood or coffee ground material  New chest pain or pain under the shoulder blades  Painful or persistently difficult swallowing  New shortness of breath  Fever of 100F or higher  Black, tarry-looking stools  For urgent or emergent issues, a gastroenterologist can be reached at any hour  by calling [phone].   DIET:  We do recommend a small meal at first, but then you may proceed to your regular diet.  Drink plenty of fluids but you should avoid alcoholic beverages for 24 hours.  ACTIVITY:  You should plan to take it easy for the rest of today and you should NOT DRIVE or use heavy machinery until tomorrow (because of the sedation medicines used during the test).    FOLLOW UP: Our staff will call the number listed on your records 48-72 hours following your procedure to check on you and address any questions or concerns that you may have regarding the information given to you following your procedure. If we do not reach you, we will leave a message.  We will attempt to reach you two times.  During this call, we will ask if you have developed any symptoms of COVID 19. If you develop any symptoms (ie: fever, flu-like symptoms, shortness of breath, cough etc.) before then, please call [phone].  If you test positive for Covid 19 in the 2 weeks post procedure, please call and report this information to [place].    If any biopsies were taken you will be contacted by phone or by letter within the next 1-3 weeks.  Please call us at [phone] if you have not heard about the biopsies in 3 weeks.    SIGNATURES/CONFIDENTIALITY: You and/or your care partner have signed paperwork which will be entered into your electronic medical  record.  These signatures attest to the fact that that the information above on your After Visit Summary has been reviewed and is understood.  Full responsibility of the confidentiality of this discharge information lies with you and/or your care-partner. 

## 2019-03-18 NOTE — Progress Notes (Signed)
Vitals [name] C/ Temps [name]

## 2019-03-18 NOTE — Progress Notes (Signed)
Pt's states no medical or surgical changes since previsit or office visit. 

## 2019-03-18 NOTE — Op Note (Signed)
[place] Patient Name: [name] Procedure Date: 03/18/2019 3:41 PM MRN: [id number] Endoscopist: Ladene Artist , MD Age: 63 Referring MD:  Date of Birth: 01-11-1956 Gender: Male Account #: [id number] Procedure:                Upper GI endoscopy Indications:              Iron deficiency anemia, Gastroesophageal reflux                            disease Medicines:                Monitored Anesthesia Care Procedure:                Pre-Anesthesia Assessment:                           - Prior to the procedure, a History and Physical                            was performed, and patient medications and                            allergies were reviewed. The patient's tolerance of                            previous anesthesia was also reviewed. The risks                            and benefits of the procedure and the sedation                            options and risks were discussed with the patient.                            All questions were answered, and informed consent                            was obtained. Prior Anticoagulants: The patient has                            taken Eliquis (apixaban), last dose was 2 days                            prior to procedure. ASA Grade Assessment: II - A                            patient with mild systemic disease. After reviewing                            the risks and benefits, the patient was deemed in                            satisfactory condition to undergo the procedure.  After obtaining informed consent, the endoscope was                            passed under direct vision. Throughout the                            procedure, the patient's blood pressure, pulse, and                            oxygen saturations were monitored continuously. The                            Endoscope was introduced through the mouth, and                            advanced to the efferent jejunal loop. The  upper GI                            endoscopy was accomplished without difficulty. The                            patient tolerated the procedure well. Scope In: Scope Out: Findings:                 The examined esophagus was normal.                           Evidence of a gastric bypass was found. A gastric                            pouch with a 5 cm length from the GE junction to                            the gastrojejunal anastomosis was found. The staple                            line appeared intact. The gastrojejunal anastomosis                            was characterized by erythema. This was traversed.                            Biopsies were taken with a cold forceps for                            histology. The gastric pouch otherwise appeared                            normal including a retroflexed view of the cardia.                            Examined small bowel appeared normal. Complications:            No immediate complications. Estimated Blood Loss:  Estimated blood loss was minimal. Impression:               - Normal esophagus.                           - Gastric bypass with a pouch 5 cm in length and                            intact staple line.                           - Gastrojejunal anastomosis characterized by                            erythema. Biopsied. Recommendation:           - Patient has a contact number available for                            emergencies. The signs and symptoms of potential                            delayed complications were discussed with the                            patient. Return to normal activities tomorrow.                            Written discharge instructions were provided to the                            patient.                           - Resume previous diet.                           - Continue present medications.                           - Resume Eliquis (apixaban) at prior dose tomorrow.                             Refer to managing physician for further adjustment                            of therapy.                           - Await pathology results.                           - Iron replacement long term managed by PCP. 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 Ladene Artist, MD 03/18/2019 3:57:39 PM This report has been signed electronically.

## 2019-03-18 NOTE — Progress Notes (Signed)
Report given to PACU, vss 

## 2019-03-18 NOTE — Op Note (Signed)
[facility] Patient Name: [name] Procedure Date: 03/18/2019 3:12 PM MRN: [id number] Endoscopist: Ladene Artist , MD Age: 63 Referring MD:  Date of Birth: 08-Sep-1955 Gender: Male Account #: [id number] Procedure:                Colonoscopy Indications:              Iron deficiency anemia. Personal history of                            adenomatous colon polyps. Medicines:                Monitored Anesthesia Care Procedure:                Pre-Anesthesia Assessment:                           - Prior to the procedure, a History and Physical                            was performed, and patient medications and                            allergies were reviewed. The patient's tolerance of                            previous anesthesia was also reviewed. The risks                            and benefits of the procedure and the sedation                            options and risks were discussed with the patient.                            All questions were answered, and informed consent                            was obtained. Prior Anticoagulants: The patient has                            taken Eliquis (apixaban), last dose was 2 days                            prior to procedure. ASA Grade Assessment: II - A                            patient with mild systemic disease. After reviewing                            the risks and benefits, the patient was deemed in                            satisfactory condition to undergo the procedure.  After obtaining informed consent, the colonoscope                            was passed under direct vision. Throughout the                            procedure, the patient's blood pressure, pulse, and                            oxygen saturations were monitored continuously. The                            Colonoscope was introduced through the anus and                            advanced to the the cecum,  identified by                            appendiceal orifice and ileocecal valve. The                            ileocecal valve, appendiceal orifice, and rectum                            were photographed. The quality of the bowel                            preparation was adequate after extensive lavage and                            suctioning. The colonoscopy was performed without                            difficulty. The patient tolerated the procedure                            well. Scope In: 3:14:11 PM Scope Out: 3:39:32 PM Scope Withdrawal Time: 0 hours 21 minutes 41 seconds  Total Procedure Duration: 0 hours 25 minutes 21 seconds  Findings:                 The perianal and digital rectal examinations were                            normal.                           Five sessile polyps were found in the sigmoid colon                            (4) and transverse colon (5). The polyps were 5 to                            9 mm in size. These polyps were removed with a cold  snare. Resection and retrieval were complete.                           Two small localized angiodysplastic lesions without                            bleeding were found in the cecum.                           Internal hemorrhoids were found during                            retroflexion. The hemorrhoids were small and Grade                            I (internal hemorrhoids that do not prolapse).                           The exam was otherwise without abnormality on                            direct and retroflexion views. Complications:            No immediate complications. Estimated blood loss:                            None. Estimated Blood Loss:     Estimated blood loss: none. Impression:               - Five 5 to 9 mm polyps in the sigmoid colon and in                            the transverse colon, removed with a cold snare.                            Resected and  retrieved.                           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                           - Internal hemorrhoids.                           - The examination was otherwise normal on direct                            and retroflexion views. Recommendation:           - Repeat colonoscopy date to be determined after                            pending pathology results are reviewed for                            surveillance with a more extensive bowel prep.                           -  Resume Eliquis (apixaban) tomorrow at prior dose.                            Refer to managing physician for further adjustment                            of therapy.                           - Patient has a contact number available for                            emergencies. The signs and symptoms of potential                            delayed complications were discussed with the                            patient. Return to normal activities tomorrow.                            Written discharge instructions were provided to the                            patient.                           - Resume previous diet.                           - Continue present medications.                           - Await pathology results. Ladene Artist, MD 03/18/2019 3:50:58 PM This report has been signed electronically.

## 2019-03-20 ENCOUNTER — Telehealth: Payer: Self-pay | Admitting: *Deleted

## 2019-03-20 NOTE — Telephone Encounter (Signed)
  Follow up Call-  Call back number 03/18/2019  Post procedure Call Back phone  # [phone]  Permission to leave phone message Yes  Some recent data might be hidden     Patient questions:  Do you have a fever, pain , or abdominal swelling? No. Pain Score  0 *  Have you tolerated food without any problems? Yes.    Have you been able to return to your normal activities? Yes.    Do you have any questions about your discharge instructions: Diet   No. Medications  No. Follow up visit  No.  Do you have questions or concerns about your Care? No.  Actions: * If pain score is 4 or above: No action needed, pain <4.  1. Have you developed a fever since your procedure? no  2.   Have you had an respiratory symptoms (SOB or cough) since your procedure? No  3.   Have you tested positive for COVID 19 since your procedure no  4.   Have you had any family members/close contacts diagnosed with the COVID 19 since your procedure?  no  If yes to any of these questions please route to Joylene John, RN and Alphonsa Gin, Therapist, sports.

## 2019-03-21 ENCOUNTER — Telehealth: Payer: Self-pay

## 2019-03-21 NOTE — Telephone Encounter (Signed)
Copied from [place] [id number]. Topic: General - Other >> Mar 21, 2019  9:10 AM Celene Kras A wrote: Reason for CRM: Pt got letter in the mail from [facility] stating there is a test he needs to take. Pt requesting advice.

## 2019-03-25 ENCOUNTER — Other Ambulatory Visit: Payer: Self-pay | Admitting: Family Medicine

## 2019-03-25 ENCOUNTER — Encounter: Payer: Self-pay | Admitting: Family Medicine

## 2019-03-25 NOTE — Telephone Encounter (Signed)
Medication Refill - Medication: HYDROcodone-acetaminophen (NORCO) 10-325 MG tablet    Has the patient contacted their pharmacy? Yes.   (Agent: If no, request that the patient contact the pharmacy for the refill.) (Agent: If yes, when and what did the pharmacy advise?)  Preferred Pharmacy (with phone number or street name):  [facility], [place] [phone] (Phone) [phone] (Fax)     Agent: Please be advised that RX refills may take up to 3 business days. We ask that you follow-up with your pharmacy.

## 2019-03-27 ENCOUNTER — Other Ambulatory Visit: Payer: Self-pay

## 2019-03-27 MED ORDER — HYDROCODONE-ACETAMINOPHEN 10-325 MG PO TABS: 1.0000 | ORAL_TABLET | ORAL | 0 refills | Status: AC | PRN

## 2019-03-27 MED ORDER — HYDROCODONE-ACETAMINOPHEN 10-325 MG PO TABS: 1.0000 | ORAL_TABLET | ORAL | 0 refills | Status: DC | PRN

## 2019-03-27 NOTE — Progress Notes (Signed)
TA-Plz see refill req/per [place] PMP pt is compliant without any red flags/LOV 6.10.20/Not due till Dec/I have prepared Sept, Oct, Nov and pended for your approval/thx dmf

## 2019-03-29 ENCOUNTER — Other Ambulatory Visit: Payer: Self-pay | Admitting: Family Medicine

## 2019-03-31 ENCOUNTER — Encounter: Payer: Self-pay | Admitting: Gastroenterology

## 2019-04-08 ENCOUNTER — Encounter: Payer: Self-pay | Admitting: Family Medicine

## 2019-04-16 ENCOUNTER — Telehealth: Payer: Self-pay

## 2019-04-16 NOTE — Telephone Encounter (Signed)
Oh dear.  He had his colonoscopy and endoscopy on 8/3.  They should have told him okay restart Eliquis 2 days after those procedures.  I am so sorry no one told him.  Okay to restart.

## 2019-04-16 NOTE — Telephone Encounter (Signed)
TA-Pt is wondering when he should restart blood thinner after GI stuff? Plz advise/thx dmf

## 2019-04-17 NOTE — Telephone Encounter (Signed)
Sent pt MyChart message advising of this/thx dmf

## 2019-04-22 DIAGNOSIS — E1159 Type 2 diabetes mellitus with other circulatory complications: Secondary | ICD-10-CM | POA: Insufficient documentation

## 2019-04-22 NOTE — Assessment & Plan Note (Signed)
Has remained diet controlled for so long.  Will repeat a1c at his next follow up.  Lab Results  Component Value Date   HGBA1C 5.7 (H) 01/23/2019

## 2019-04-22 NOTE — Assessment & Plan Note (Signed)
Checked two months ago and looked excellent. Will not repeat today.

## 2019-04-22 NOTE — Assessment & Plan Note (Signed)
  Microcytic anemia- I was quite concerned about this and [name] agreed to go to GI for endoscopy and colonoscopy.  He had both done on 03/19/19 by Dr. Fuller Plan- reviewed.  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    I have placed him on iron which he will likely need to take long term.  Repeat CBC and ferrritin today.

## 2019-04-22 NOTE — Assessment & Plan Note (Addendum)
Due for updating his CSC and UDS today.    Indication for chronic opioid: DDD/DJD Medication and dose Norco 10/325- 1 tab every 4 hours as needed for pain # pills per month: 150 Last UDS date: 04/24/18 Pain contract signed (Y/N): Y Date narcotic database last reviewed (include red flags): 04/24/19

## 2019-04-22 NOTE — Assessment & Plan Note (Signed)
Normotensive.  No changes made. 

## 2019-04-22 NOTE — Assessment & Plan Note (Signed)
Continue high dose zocor given h/o CAD with CABG, MI.

## 2019-04-22 NOTE — Progress Notes (Signed)
Subjective:   Patient ID: [name], male    DOB: 1956-04-19, 63 y.o.   MRN: [id number]  [name] is a pleasant 63 y.o. year old male who presents to clinic today with follow up anemia, update UDS/CSC (Per [name] pt is compliant without red flags.), and Knee Pain (He is C/O bilateral knee pain. He is wondering about braces for his knees.  He has tried the [name] pull up things and they don't help.)  on 04/24/2019  HPI:  Microcytic anemia- I was quite concerned about this and [name] agreed to go to GI for endoscopy and colonoscopy.  He had both done on 03/19/19 by [name]- reviewed.  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    I have placed him on iron which he will likely need to take long term.  Folate and B12 normal last month. Lab Results  Component Value Date   WBC 4.8 01/23/2019   HGB 8.9 (L) 01/23/2019   HCT 29.7 (L) 01/23/2019   MCV 74.8 (L) 01/23/2019   PLT 331 01/23/2019   Lab Results  Component Value Date   FERRITIN 34.3 03/01/2019   Bilateral knee pain- right >left, has been walking more, getting up and down which hurts. No warmth but can sweel.  Chronic pain- back, hip, knee.  Feels narcotics do still take the edge off.  Indication for chronic opioid: DDD/DJD Medication and dose Norco 10/325- 1 tab every 4 hours as needed for pain # pills per month: 150 Last UDS date: 04/24/18 Pain contract signed (Y/N): Y Date narcotic database last reviewed (include red flags): 04/24/19   Diabetes- has been diet controlled.  Lab Results  Component Value Date   HGBA1C 5.7 (H) 01/23/2019    Lab Results  Component Value Date   ALT 7 (L) 01/23/2019   AST 14 01/23/2019   ALKPHOS 45 01/22/2018   BILITOT 0.4 01/23/2019    Recurrent DVT/PE- since embolic stroke in 99991111, is now on life long anticoagulation on eliquis.  HLD- He has also been compliant  with Zocor 80 mg daily.  On highest dose of lipitor given prior h/o CAD s/[ CABG. The ASCVD Risk score [name]., et al., 2013) failed to calculate for the following reasons:   The patient has a prior MI or stroke diagnosis   Lab Results  Component Value Date   CHOL 140 01/23/2019   HDL 52 01/23/2019   LDLCALC 74 01/23/2019   LDLDIRECT 54.0 02/10/2015   TRIG 63 01/23/2019   CHOLHDL 2.7 01/23/2019   Lab Results  Component Value Date   ALT 7 (L) 01/23/2019   AST 14 01/23/2019   ALKPHOS 45 01/22/2018   BILITOT 0.4 01/23/2019   Current Outpatient Medications on File Prior to Visit  Medication Sig Dispense Refill  . ALPRAZolam (XANAX) 1 MG tablet TAKE 1 TABLET THREE TIMES DAILY AS NEEDED FOR ANXIETY 90 tablet 5  . aspirin 81 MG tablet Take 81 mg by mouth daily.      . Calcium Citrate-Vitamin D 500-400 MG-UNIT CHEW Chew 1 tablet by mouth 2 (two) times daily.      . Cholecalciferol (VITAMIN D) 2000 UNITS tablet Take 5,000 Units by mouth daily.     . cyanocobalamin 500 MCG tablet Take by mouth daily.     Marland Kitchen ELIQUIS 5 MG TABS tablet TAKE 1 TABLET BY  MOUTH TWICE DAILY 60 tablet 10  . ferrous sulfate 220 (44 Fe) MG/5ML solution TAKE 6.8MLS (300MG  TOTAL) BY MOUTH ONCE DAILY. 150 mL 0  . fexofenadine (ALLEGRA) 180 MG tablet Take 180 mg by mouth daily as needed.    Marland Kitchen HYDROcodone-acetaminophen (NORCO) 10-325 MG tablet Take 1 tablet by mouth every 4 (four) hours as needed. 150 tablet 0  . HYDROcodone-acetaminophen (NORCO) 10-325 MG tablet Take 1 tablet by mouth every 4 (four) hours as needed. 150 tablet 0  . HYDROcodone-acetaminophen (NORCO) 10-325 MG tablet Take 1 tablet by mouth every 4 (four) hours as needed. 150 tablet 0  . Multiple Vitamin (MULTIVITAMIN) tablet Take 1 tablet by mouth daily.      . nitroGLYCERIN (NITROSTAT) 0.3 MG SL tablet Place 1 tablet (0.3 mg total) under the tongue every 5 (five) minutes as needed. 90 tablet 3  . omeprazole (PRILOSEC) 20 MG capsule Take 20 mg by mouth  daily.    . simvastatin (ZOCOR) 80 MG tablet TAKE ONE TABLET BY MOUTH AT BEDTIME. 90 tablet 0   No current facility-administered medications on file prior to visit.     Allergies  Allergen Reactions  . Codeine     REACTION: makes me "looney"    Past Medical History:  Diagnosis Date  . Anemia   . Arthritis   . CAD (coronary artery disease)   . Diabetes mellitus   . Hyperlipidemia   . Hypertension   . Left ventricular dysfunction   . Low back pain   . MI (myocardial infarction) ([place])   . Obesity   . PE (pulmonary embolism)   . Stroke ([place])   . Tobacco abuse   . Tubular adenoma of colon 05/2015    Past Surgical History:  Procedure Laterality Date  . CHOLECYSTECTOMY    . CORONARY ARTERY BYPASS GRAFT    . CORONARY STENT PLACEMENT     3 all in right side  . GASTRIC BYPASS    . HERNIA REPAIR    . IR GENERIC HISTORICAL  09/27/2016   IR ANGIO INTRA EXTRACRAN SEL COM CAROTID INNOMINATE UNI L MOD SED 09/27/2016 Dennis Bras, MD [facility]  . IR GENERIC HISTORICAL  09/27/2016   IR PERCUTANEOUS ART THROMBECTOMY/INFUSION INTRACRANIAL INC DIAG ANGIO 09/27/2016 Dennis Bras, MD [facility]  . IR GENERIC HISTORICAL  11/07/2016   IR RADIOLOGIST EVAL & MGMT 11/07/2016 [facility]  . RADIOLOGY WITH ANESTHESIA N/A 09/27/2016   Procedure: RADIOLOGY WITH ANESTHESIA;  Surgeon: Dennis Bras, MD;  Location: [facility];  Service: Radiology;  Laterality: N/A;    Family History  Problem Relation Age of Onset  . Emphysema Mother   . Heart attack Father   . Heart disease Daughter   . Heart disease Brother   . Arthritis Brother   . Hemophilia Brother   . Colon cancer Neg Hx     Social History   Socioeconomic History  . Marital status: Widowed    Spouse name: Not on file  . Number of children: Not on file  . Years of education: Not on file  . Highest education level: Not on file  Occupational History  . Not on file  Social Needs  . Financial resource strain: Not on file   . Food insecurity    Worry: Not on file    Inability: Not on file  . Transportation needs    Medical: Not on file    Non-medical: Not on file  Tobacco Use  . Smoking status: Current Every  Day Smoker    Packs/day: 1.00    Years: 40.00    Pack years: 40.00    Types: Cigarettes  . Smokeless tobacco: Former Network engineer and Sexual Activity  . Alcohol use: No  . Drug use: Yes    Types: Marijuana  . Sexual activity: Yes  Lifestyle  . Physical activity    Days per week: Not on file    Minutes per session: Not on file  . Stress: Not on file  Relationships  . Social Herbalist on phone: Not on file    Gets together: Not on file    Attends religious service: Not on file    Active member of club or organization: Not on file    Attends meetings of clubs or organizations: Not on file    Relationship status: Not on file  . Intimate partner violence    Fear of current or ex partner: Not on file    Emotionally abused: Not on file    Physically abused: Not on file    Forced sexual activity: Not on file  Other Topics Concern  . Not on file  Social History Narrative   Would desire CPR.   Does not desire long term life support or feeding tubes.         The PMH, PSH, Social History, Family History, Medications, and allergies have been reviewed in [facility], and have been updated if relevant.    Review of Systems  HENT: Negative.   Eyes: Negative.   Respiratory: Negative.   Cardiovascular: Negative.   Gastrointestinal: Negative.   Endocrine: Negative.   Genitourinary: Negative.   Musculoskeletal: Positive for arthralgias.  Allergic/Immunologic: Negative.   Neurological: Negative.   Hematological: Negative.   Psychiatric/Behavioral: Negative.   All other systems reviewed and are negative.      Objective:    BP (!) 114/56 (BP Location: Right Arm, Patient Position: Sitting, Cuff Size: Normal)   Pulse (!) 53   Temp 98.4 F (36.9 C) (Oral)   Wt 168 lb 9.6 oz (76.5  kg)   SpO2 95%   BMI 22.87 kg/m   Wt Readings from Last 3 Encounters:  04/24/19 168 lb 9.6 oz (76.5 kg)  03/18/19 177 lb (80.3 kg)  03/01/19 177 lb (80.3 kg)    Physical Exam  Constitutional: He is oriented to person, place, and time. He appears well-developed and well-nourished. No distress.  HENT:  Head: Normocephalic and atraumatic.  Eyes: Conjunctivae are normal.  Neck: Normal range of motion.  Cardiovascular: Normal rate.  Pulmonary/Chest: Effort normal and breath sounds normal.  Abdominal: Soft.  Musculoskeletal: Normal range of motion.        General: No edema.     Right knee: He exhibits deformity and abnormal alignment.  Neurological: He is alert and oriented to person, place, and time. No cranial nerve deficit.  Skin: Skin is warm and dry. He is not diaphoretic.  Psychiatric: He has a normal mood and affect. His behavior is normal. Judgment and thought content normal.  Nursing note and vitals reviewed.         Assessment & Patterson:   Diabetes mellitus type 2, diet-controlled ([name])  Vitamin D deficiency  Iron deficiency anemia, unspecified iron deficiency anemia type - Patterson: CBC with Differential/Platelet, Ferritin  TOBACCO ABUSE  Essential hypertension  PULMONARY EMBOLISM, HX OF  Chronic pain syndrome  Recurrent pulmonary emboli ([name])  Cerebrovascular accident (CVA) due to embolism of right middle cerebral artery ([place])  Opioid type dependence, continuous ([place]) - Patterson: Pain Mgmt, Profile 8 w/Conf, U  Type 2 diabetes mellitus with other circulatory complications ([place]), Chronic  Hyperlipidemia, unspecified hyperlipidemia type  Need for influenza vaccination - Patterson: Flu Vaccine QUAD 6+ mos PF IM (Fluarix Quad PF) No follow-ups on file.

## 2019-04-22 NOTE — Assessment & Plan Note (Signed)
On lifelong anticoagulation with Eliquis. 

## 2019-04-23 ENCOUNTER — Telehealth: Payer: Self-pay

## 2019-04-23 NOTE — Telephone Encounter (Signed)
Questions for Screening COVID-19  Symptom onset: None  Travel or Contacts: None  During this illness, did/does the patient experience any of the following symptoms? Fever >100.20F []   Yes [x]   No []   Unknown Subjective fever (felt feverish) []   Yes [x]   No []   Unknown Chills []   Yes [x]   No []   Unknown Muscle aches (myalgia) []   Yes [x]   No []   Unknown Runny nose (rhinorrhea) []   Yes [x]   No []   Unknown Sore throat []   Yes []   No []   Unknown Cough (new onset or worsening of chronic cough) []   Yes [x]   No []   Unknown Shortness of breath (dyspnea) []   Yes [x]   No []   Unknown Nausea or vomiting []   Yes [x]   No []   Unknown Headache []   Yes [x]   No []   Unknown Abdominal pain  []   Yes [x]   No []   Unknown Diarrhea (?3 loose/looser than normal stools/24hr period) []   Yes [x]   No []   Unknown Other, specify:  Patient risk factors: Smoker? []   Current []   Former []   Never If male, currently pregnant? []   Yes []   No  Patient Active Problem List   Diagnosis Date Noted  . Type 2 diabetes mellitus with other circulatory complications ([place]) [id number]  . Opioid type dependence, continuous ([place]) 07/20/2017  . History of pulmonary embolism 04/20/2017  . Hx of CABG 04/20/2017  . GERD (gastroesophageal reflux disease) 01/03/2017  . Left carotid stenosis 09/30/2016  . Recurrent pulmonary emboli ([place]) 09/30/2016  . Deep vein thrombosis (DVT) of popliteal vein of left lower extremity ([place]) 09/30/2016  . CAD (coronary artery disease) 09/30/2016  . Tetrahydrocannabinol (THC) use disorder, mild, abuse 09/30/2016  . Cerebrovascular accident (CVA) due to embolism of right middle cerebral artery ([name]) - s/ IV tPA and mechanical thrombectomy   . CVA (cerebral vascular accident) ([place]) 09/27/2016  . Generalized anxiety disorder 02/09/2016  . Constipation 01/08/2015  . Other nonspecific abnormal finding of lung field 09/02/2013  . Chronic pain syndrome 09/02/2013  . Vitamin Philo Kurtz deficiency 09/03/2009  .  CAD, ARTERY BYPASS GRAFT 05/20/2009  . Diabetes mellitus type 2, diet-controlled ([place]) 12/11/2006  . Hyperlipidemia 12/11/2006  . Iron deficiency anemia 12/11/2006  . TOBACCO ABUSE 12/11/2006  . Essential hypertension 12/11/2006  . MYOCARDIAL INFARCTION, HX OF 12/11/2006  . CARDIOMYOPATHY, ISCHEMIC 12/11/2006  . PULMONARY EMBOLISM, HX OF 12/11/2006    Plan:  []   High risk for COVID-19 with red flags go to ED (with CP, SOB, weak/lightheaded, or fever > 101.5). Call ahead.  []   High risk for COVID-19 but stable. Inform provider and coordinate time for [facility] visit.   []   No red flags but URI signs or symptoms okay for [facility] visit.

## 2019-04-24 ENCOUNTER — Encounter: Payer: Self-pay | Admitting: Family Medicine

## 2019-04-24 ENCOUNTER — Other Ambulatory Visit: Payer: Self-pay

## 2019-04-24 ENCOUNTER — Ambulatory Visit (INDEPENDENT_AMBULATORY_CARE_PROVIDER_SITE_OTHER): Payer: Medicare HMO | Admitting: Family Medicine

## 2019-04-24 ENCOUNTER — Ambulatory Visit (INDEPENDENT_AMBULATORY_CARE_PROVIDER_SITE_OTHER): Payer: Medicare HMO

## 2019-04-24 VITALS — BP 114/56 | HR 53 | Temp 98.4°F | Wt 168.6 lb

## 2019-04-24 DIAGNOSIS — M25562 Pain in left knee: Secondary | ICD-10-CM

## 2019-04-24 DIAGNOSIS — E559 Vitamin D deficiency, unspecified: Secondary | ICD-10-CM

## 2019-04-24 DIAGNOSIS — G894 Chronic pain syndrome: Secondary | ICD-10-CM

## 2019-04-24 DIAGNOSIS — E785 Hyperlipidemia, unspecified: Secondary | ICD-10-CM

## 2019-04-24 DIAGNOSIS — Z23 Encounter for immunization: Secondary | ICD-10-CM

## 2019-04-24 DIAGNOSIS — D509 Iron deficiency anemia, unspecified: Secondary | ICD-10-CM | POA: Diagnosis not present

## 2019-04-24 DIAGNOSIS — F1120 Opioid dependence, uncomplicated: Secondary | ICD-10-CM

## 2019-04-24 DIAGNOSIS — I10 Essential (primary) hypertension: Secondary | ICD-10-CM

## 2019-04-24 DIAGNOSIS — M25561 Pain in right knee: Secondary | ICD-10-CM

## 2019-04-24 DIAGNOSIS — F17200 Nicotine dependence, unspecified, uncomplicated: Secondary | ICD-10-CM

## 2019-04-24 DIAGNOSIS — I2699 Other pulmonary embolism without acute cor pulmonale: Secondary | ICD-10-CM

## 2019-04-24 DIAGNOSIS — Z86718 Personal history of other venous thrombosis and embolism: Secondary | ICD-10-CM | POA: Diagnosis not present

## 2019-04-24 DIAGNOSIS — E119 Type 2 diabetes mellitus without complications: Secondary | ICD-10-CM | POA: Diagnosis not present

## 2019-04-24 DIAGNOSIS — I63411 Cerebral infarction due to embolism of right middle cerebral artery: Secondary | ICD-10-CM | POA: Diagnosis not present

## 2019-04-24 DIAGNOSIS — E1159 Type 2 diabetes mellitus with other circulatory complications: Secondary | ICD-10-CM

## 2019-04-24 LAB — CBC WITH DIFFERENTIAL/PLATELET
Basophils Absolute: 0.1 10*3/uL (ref 0.0–0.1)
Basophils Relative: 1.1 % (ref 0.0–3.0)
Eosinophils Absolute: 0.1 10*3/uL (ref 0.0–0.7)
Eosinophils Relative: 2.6 % (ref 0.0–5.0)
HCT: 42.5 % (ref 39.0–52.0)
Hemoglobin: 14.3 g/dL (ref 13.0–17.0)
Lymphocytes Relative: 34.8 % (ref 12.0–46.0)
Lymphs Abs: 1.8 10*3/uL (ref 0.7–4.0)
MCHC: 33.7 g/dL (ref 30.0–36.0)
MCV: 89.4 fl (ref 78.0–100.0)
Monocytes Absolute: 0.4 10*3/uL (ref 0.1–1.0)
Monocytes Relative: 8.5 % (ref 3.0–12.0)
Neutro Abs: 2.7 10*3/uL (ref 1.4–7.7)
Neutrophils Relative %: 53 % (ref 43.0–77.0)
Platelets: 191 10*3/uL (ref 150.0–400.0)
RBC: 4.75 Mil/uL (ref 4.22–5.81)
RDW: 25.8 % — ABNORMAL HIGH (ref 11.5–15.5)
WBC: 5.1 10*3/uL (ref 4.0–10.5)

## 2019-04-24 LAB — FERRITIN: Ferritin: 36.6 ng/mL (ref 22.0–322.0)

## 2019-04-24 NOTE — Assessment & Plan Note (Signed)
Likely arthritis. Knee xray today on right knee, right> left.

## 2019-04-24 NOTE — Assessment & Plan Note (Signed)
Has been diet controlled.  Not due for a1c today.Marland Kitchen

## 2019-04-24 NOTE — Patient Instructions (Signed)
Great to see you. I will call you with your results from today and you can view them online.     Iron-Rich Diet  Iron is a mineral that helps your body to produce hemoglobin. Hemoglobin is a protein in red blood cells that carries oxygen to your body's tissues. Eating too little iron may cause you to feel weak and tired, and it can increase your risk of infection. Iron is naturally found in many foods, and many foods have iron added to them (iron-fortified foods). You may need to follow an iron-rich diet if you do not have enough iron in your body due to certain medical conditions. The amount of iron that you need each day depends on your age, your sex, and any medical conditions you have. Follow instructions from your health care provider or a diet and nutrition specialist (dietitian) about how much iron you should eat each day. What are tips for following this plan? Reading food labels  Check food labels to see how many milligrams (mg) of iron are in each serving. Cooking  Cook foods in pots and pans that are made from iron.  Take these steps to make it easier for your body to absorb iron from certain foods: ? Soak beans overnight before cooking. ? Soak whole grains overnight and drain them before using. ? Ferment flours before baking, such as by using yeast in bread dough. Meal planning  When you eat foods that contain iron, you should eat them with foods that are high in vitamin C. These include oranges, peppers, tomatoes, potatoes, and mango. Vitamin C helps your body to absorb iron. General information  Take iron supplements only as told by your health care provider. An overdose of iron can be life-threatening. If you were prescribed iron supplements, take them with orange juice or a vitamin C supplement.  When you eat iron-fortified foods or take an iron supplement, you should also eat foods that naturally contain iron, such as meat, poultry, and fish. Eating naturally iron-rich  foods helps your body to absorb the iron that is added to other foods or contained in a supplement.  Certain foods and drinks prevent your body from absorbing iron properly. Avoid eating these foods in the same meal as iron-rich foods or with iron supplements. These foods include: ? Coffee, black tea, and red wine. ? Milk, dairy products, and foods that are high in calcium. ? Beans and soybeans. ? Whole grains. What foods should I eat? Fruits Prunes. Raisins. Eat fruits high in vitamin C, such as oranges, grapefruits, and strawberries, alongside iron-rich foods. Vegetables Spinach (cooked). Green peas. Broccoli. Fermented vegetables. Eat vegetables high in vitamin C, such as leafy greens, potatoes, bell peppers, and tomatoes, alongside iron-rich foods. Grains Iron-fortified breakfast cereal. Iron-fortified whole-wheat bread. Enriched rice. Sprouted grains. Meats and other proteins Beef liver. Oysters. Beef. Shrimp. [place]. Chicken. [place]. Sardines. Chickpeas. Nuts. Tofu. Pumpkin seeds. Beverages Tomato juice. Fresh orange juice. Prune juice. Hibiscus tea. Fortified instant breakfast shakes. Sweets and desserts Blackstrap molasses. Seasonings and condiments Tahini. Fermented soy sauce. Other foods Wheat germ. The items listed above may not be a complete list of recommended foods and beverages. Contact a dietitian for more information. What foods should I avoid? Grains Whole grains. Bran cereal. Bran flour. Oats. Meats and other proteins Soybeans. Products made from soy protein. Black beans. Lentils. Mung beans. Split peas. Dairy Milk. Cream. Cheese. Yogurt. Cottage cheese. Beverages Coffee. Black tea. Red wine. Sweets and desserts Cocoa. Chocolate. Ice cream. Other  foods Basil. Oregano. Large amounts of parsley. The items listed above may not be a complete list of foods and beverages to avoid. Contact a dietitian for more information. Summary  Iron is a mineral that helps  your body to produce hemoglobin. Hemoglobin is a protein in red blood cells that carries oxygen to your body's tissues.  Iron is naturally found in many foods, and many foods have iron added to them (iron-fortified foods).  When you eat foods that contain iron, you should eat them with foods that are high in vitamin C. Vitamin C helps your body to absorb iron.  Certain foods and drinks prevent your body from absorbing iron properly, such as whole grains and dairy products. You should avoid eating these foods in the same meal as iron-rich foods or with iron supplements. This information is not intended to replace advice given to you by your health care provider. Make sure you discuss any questions you have with your health care provider. Document Released: 03/15/2005 Document Revised: 07/14/2017 Document Reviewed: 06/27/2017 Elsevier Patient Education  2020 [employer].

## 2019-04-24 NOTE — Addendum Note (Signed)
Addended by: Lynnea Ferrier on: 04/24/2019 10:44 AM   Modules accepted: Orders

## 2019-04-25 ENCOUNTER — Other Ambulatory Visit: Payer: Self-pay | Admitting: Family Medicine

## 2019-04-25 MED ORDER — FERROUS SULFATE 220 (44 FE) MG/5ML PO ELIX: 220.0000 mg | ORAL_SOLUTION | Freq: Every day | ORAL | 3 refills | Status: DC

## 2019-04-25 MED ORDER — HYDROCODONE-ACETAMINOPHEN 10-325 MG PO TABS: 1.0000 | ORAL_TABLET | ORAL | 0 refills | Status: AC | PRN

## 2019-04-26 LAB — PAIN MGMT, PROFILE 8 W/CONF, U
6 Acetylmorphine: NEGATIVE ng/mL
Alcohol Metabolites: NEGATIVE ng/mL (ref <500)
Alphahydroxyalprazolam: 382 ng/mL
Alphahydroxymidazolam: NEGATIVE ng/mL
Alphahydroxytriazolam: NEGATIVE ng/mL
Aminoclonazepam: NEGATIVE ng/mL
Amphetamines: NEGATIVE ng/mL
Benzodiazepines: POSITIVE ng/mL
Buprenorphine, Urine: NEGATIVE ng/mL
Cocaine Metabolite: NEGATIVE ng/mL
Codeine: NEGATIVE ng/mL
Creatinine: 102.9 mg/dL
Hydrocodone: 717 ng/mL
Hydromorphone: 110 ng/mL
Hydroxyethylflurazepam: NEGATIVE ng/mL
Lorazepam: NEGATIVE ng/mL
MDMA: NEGATIVE ng/mL
Marijuana Metabolite: 331 ng/mL
Marijuana Metabolite: POSITIVE ng/mL
Morphine: NEGATIVE ng/mL
Nordiazepam: NEGATIVE ng/mL
Norhydrocodone: 1677 ng/mL
Opiates: POSITIVE ng/mL
Oxazepam: NEGATIVE ng/mL
Oxidant: NEGATIVE ug/mL
Oxycodone: NEGATIVE ng/mL
Temazepam: NEGATIVE ng/mL
pH: 6.2 (ref 4.5–9.0)

## 2019-05-09 ENCOUNTER — Encounter: Payer: Self-pay | Admitting: Family Medicine

## 2019-05-10 ENCOUNTER — Other Ambulatory Visit: Payer: Self-pay

## 2019-05-10 MED ORDER — APIXABAN 5 MG PO TABS: 5.0000 mg | ORAL_TABLET | Freq: Two times a day (BID) | ORAL | 10 refills | Status: DC

## 2019-05-10 NOTE — Telephone Encounter (Signed)
See other mychart message. Rx has been printed to be signed by pcp & then will be faxed.

## 2019-05-13 ENCOUNTER — Other Ambulatory Visit: Payer: Self-pay

## 2019-05-13 MED ORDER — APIXABAN 5 MG PO TABS: 5.0000 mg | ORAL_TABLET | Freq: Two times a day (BID) | ORAL | 11 refills | Status: AC

## 2019-05-15 ENCOUNTER — Encounter: Payer: Self-pay | Admitting: Family Medicine

## 2019-05-27 ENCOUNTER — Encounter: Payer: Self-pay | Admitting: Family Medicine

## 2019-06-25 ENCOUNTER — Other Ambulatory Visit: Payer: Self-pay | Admitting: Family Medicine

## 2019-06-25 NOTE — Telephone Encounter (Signed)
TA-Plz see refill req/LOV in Sept/per [place] PMP pt is compliant without any red flags/thx dmf

## 2019-07-23 ENCOUNTER — Other Ambulatory Visit: Payer: Self-pay

## 2019-07-23 MED ORDER — HYDROCODONE-ACETAMINOPHEN 10-325 MG PO TABS: 1.0000 | ORAL_TABLET | ORAL | 0 refills | Status: DC | PRN

## 2019-07-23 NOTE — Progress Notes (Signed)
Subjective:    Patient ID: [name], male    DOB: [birth year], 63 y.o.   MRN: [id number]  Chief Complaint  Patient presents with  . Follow-up    3 month Diabetes mellitus type 2 follow up.  Pt would like to discuss his iron medication.    HPI Patient is in today for a 25-month-F/U for Diabetes Mellitus Type 2.  Anemia- Microcytic anemia- I was quite concerned about this and Mr. Crego agreed to go to GI for endoscopy and colonoscopy.  He had both done on 03/19/19 by Dr. Fuller Plan- reviewed.  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    I have placed him on iron which he will likely need to take long term.   Lab Results  Component Value Date   WBC 5.1 04/24/2019   HGB 14.3 04/24/2019   HCT 42.5 04/24/2019   MCV 89.4 04/24/2019   PLT 191.0 04/24/2019    Chronic pain- back, hip, knee.  Feels narcotics do still take the edge off.  Indication for chronic opioid: DDD/DJD Medication and dose Norco 10/325- 1 tab every 4 hours as needed for pain # pills per month: 150 Last UDS date: 04/24/19 Pain contract signed (Y/N): Y Date narcotic database last reviewed (include red flags): 07/8119, no red flags. He is UTD with UDS & CSC. Next due on 9.9.2021  .  DM -has been well controlled via his diet with an A1C at 5.7 for 39-months-now.  Recurrent DVT/PE- since embolic stroke in 99991111, is now on life long anticoagulation on eliquis. His paperwork for patient assistance for his Eliquis has been completed for his visit today. According to labs completed on 9.9.20 he was no longer anemic.  Vitamin Ksean Vale deficiency He is able to keep his Vitamin Kyrollos Cordell Deficiency stable by taking Vitain Geetika Laborde 2k IU and Ca+Damaris Geers OTC which was at 25 in September.  Past Medical History:  Diagnosis Date  . Anemia   . Arthritis   . CAD (coronary artery disease)   . Diabetes mellitus   . Hyperlipidemia   . Hypertension    . Left ventricular dysfunction   . Low back pain   . MI (myocardial infarction) ([place])   . Obesity   . PE (pulmonary embolism)   . Stroke ([place])   . Tobacco abuse   . Tubular adenoma of colon 05/2015    Past Surgical History:  Procedure Laterality Date  . CHOLECYSTECTOMY    . CORONARY ARTERY BYPASS GRAFT    . CORONARY STENT PLACEMENT     3 all in right side  . GASTRIC BYPASS    . HERNIA REPAIR    . IR GENERIC HISTORICAL  09/27/2016   IR ANGIO INTRA EXTRACRAN SEL COM CAROTID INNOMINATE UNI L MOD SED 09/27/2016 Luanne Bras, MD [facility]  . IR GENERIC HISTORICAL  09/27/2016   IR PERCUTANEOUS ART THROMBECTOMY/INFUSION INTRACRANIAL INC DIAG ANGIO 09/27/2016 Luanne Bras, MD [facility]  . IR GENERIC HISTORICAL  11/07/2016   IR RADIOLOGIST EVAL & MGMT 11/07/2016 [facility]  . RADIOLOGY WITH ANESTHESIA N/A 09/27/2016   Procedure: RADIOLOGY WITH ANESTHESIA;  Surgeon: Luanne Bras, MD;  Location: [facility];  Service: Radiology;  Laterality: N/A;    Family History  Problem Relation Age of Onset  . Emphysema Mother   . Heart attack Father   . Heart disease Daughter   . Heart disease Brother   .  Arthritis Brother   . Hemophilia Brother   . Colon cancer Neg Hx     Social History   Socioeconomic History  . Marital status: Widowed    Spouse name: Not on file  . Number of children: Not on file  . Years of education: Not on file  . Highest education level: Not on file  Occupational History  . Not on file  Social Needs  . Financial resource strain: Not on file  . Food insecurity    Worry: Not on file    Inability: Not on file  . Transportation needs    Medical: Not on file    Non-medical: Not on file  Tobacco Use  . Smoking status: Current Every Day Smoker    Packs/day: 1.00    Years: 40.00    Pack years: 40.00    Types: Cigarettes  . Smokeless tobacco: Former Network engineer and Sexual Activity  . Alcohol use: No  . Drug use: Yes    Types: Marijuana  .  Sexual activity: Yes  Lifestyle  . Physical activity    Days per week: Not on file    Minutes per session: Not on file  . Stress: Not on file  Relationships  . Social Herbalist on phone: Not on file    Gets together: Not on file    Attends religious service: Not on file    Active member of club or organization: Not on file    Attends meetings of clubs or organizations: Not on file    Relationship status: Not on file  . Intimate partner violence    Fear of current or ex partner: Not on file    Emotionally abused: Not on file    Physically abused: Not on file    Forced sexual activity: Not on file  Other Topics Concern  . Not on file  Social History Narrative   Would desire CPR.   Does not desire long term life support or feeding tubes.          Outpatient Medications Prior to Visit  Medication Sig Dispense Refill  . apixaban (ELIQUIS) 5 MG TABS tablet Take 1 tablet (5 mg total) by mouth 2 (two) times daily. 60 tablet 11  . aspirin 81 MG tablet Take 81 mg by mouth daily.      . Calcium Citrate-Vitamin Kensleigh Gates 500-400 MG-UNIT CHEW Chew 1 tablet by mouth 2 (two) times daily.      . Cholecalciferol (VITAMIN Nyja Westbrook) 2000 UNITS tablet Take 5,000 Units by mouth daily.     . cyanocobalamin 500 MCG tablet Take by mouth daily.     . ferrous sulfate 220 (44 Fe) MG/5ML solution Take 5 mLs (220 mg total) by mouth daily with breakfast. TAKE 6.8MLS (300MG  TOTAL) BY MOUTH ONCE DAILY. 150 mL 3  . fexofenadine (ALLEGRA) 180 MG tablet Take 180 mg by mouth daily as needed.    . Multiple Vitamin (MULTIVITAMIN) tablet Take 1 tablet by mouth daily.      . nitroGLYCERIN (NITROSTAT) 0.3 MG SL tablet Place 1 tablet (0.3 mg total) under the tongue every 5 (five) minutes as needed. 90 tablet 3  . omeprazole (PRILOSEC) 20 MG capsule Take 20 mg by mouth daily.    . simvastatin (ZOCOR) 80 MG tablet TAKE ONE TABLET BY MOUTH AT BEDTIME. 90 tablet 3  . ALPRAZolam (XANAX) 1 MG tablet TAKE 1 TABLET THREE TIMES  DAILY AS NEEDED FOR ANXIETY 90 tablet 5  .  HYDROcodone-acetaminophen (NORCO) 10-325 MG tablet      No facility-administered medications prior to visit.     Allergies  Allergen Reactions  . Codeine     REACTION: makes me "looney"    Review of Systems  Constitutional: Negative.  Negative for fever and malaise/fatigue.  HENT: Negative.  Negative for congestion and hearing loss.   Eyes: Negative for blurred vision, discharge and redness.  Respiratory: Negative.  Negative for cough and shortness of breath.   Cardiovascular: Negative.  Negative for chest pain, palpitations and leg swelling.  Gastrointestinal: Negative for abdominal pain, blood in stool, constipation and heartburn.  Genitourinary: Negative for dysuria.  Musculoskeletal: Negative for falls.  Skin: Negative.  Negative for rash.  Neurological: Negative for loss of consciousness and headaches.  Endo/Heme/Allergies: Does not bruise/bleed easily.  Psychiatric/Behavioral: Negative for depression.  All other systems reviewed and are negative.      Objective:    Physical Exam Vitals signs and nursing note reviewed.  Constitutional:      General: He is not in acute distress.    Appearance: Normal appearance. He is obese. He is not ill-appearing, toxic-appearing or diaphoretic.  HENT:     Head: Normocephalic and atraumatic.     Right Ear: Tympanic membrane and ear canal normal. There is no impacted cerumen.     Left Ear: Tympanic membrane and ear canal normal. There is no impacted cerumen.     Nose: Nose normal.     Mouth/Throat:     Mouth: Mucous membranes are moist.  Eyes:     Extraocular Movements: Extraocular movements intact.  Neck:     Musculoskeletal: Normal range of motion.  Cardiovascular:     Rate and Rhythm: Normal rate and regular rhythm.  Pulmonary:     Effort: Pulmonary effort is normal.     Breath sounds: Normal breath sounds.  Abdominal:     General: Abdomen is flat.     Palpations: Abdomen is  soft.  Musculoskeletal: Normal range of motion.        General: No swelling.  Skin:    General: Skin is warm and dry.  Neurological:     General: No focal deficit present.     Mental Status: He is alert.  Psychiatric:        Mood and Affect: Mood normal.        Behavior: Behavior normal.        Thought Content: Thought content normal.        Judgment: Judgment normal.     BP 120/70 (BP Location: Left Arm, Patient Position: Sitting, Cuff Size: Normal)   Pulse 71   Temp (!) 97.1 F (36.2 C) (Temporal)   Ht 6' (1.829 m)   Wt 169 lb 9.6 oz (76.9 kg)   SpO2 95%   BMI 23.00 kg/m  Wt Readings from Last 3 Encounters:  07/24/19 169 lb 9.6 oz (76.9 kg)  04/24/19 168 lb 9.6 oz (76.5 kg)  03/18/19 177 lb (80.3 kg)     Lab Results  Component Value Date   WBC 5.1 04/24/2019   HGB 14.3 04/24/2019   HCT 42.5 04/24/2019   PLT 191.0 04/24/2019   GLUCOSE 102 (H) 01/23/2019   CHOL 140 01/23/2019   TRIG 63 01/23/2019   HDL 52 01/23/2019   LDLDIRECT 54.0 02/10/2015   LDLCALC 74 01/23/2019   ALT 7 (L) 01/23/2019   AST 14 01/23/2019   NA 141 01/23/2019   K 4.9 01/23/2019   CL 105 01/23/2019  CREATININE 1.24 01/23/2019   BUN 14 01/23/2019   CO2 28 01/23/2019   PSA 0.6 01/23/2019   INR 1.10 09/27/2016   HGBA1C 5.7 (H) 01/23/2019   MICROALBUR 0.6 01/23/2019    No results found for: TSH Lab Results  Component Value Date   WBC 5.1 04/24/2019   HGB 14.3 04/24/2019   HCT 42.5 04/24/2019   MCV 89.4 04/24/2019   PLT 191.0 04/24/2019   Lab Results  Component Value Date   NA 141 01/23/2019   K 4.9 01/23/2019   CO2 28 01/23/2019   GLUCOSE 102 (H) 01/23/2019   BUN 14 01/23/2019   CREATININE 1.24 01/23/2019   BILITOT 0.4 01/23/2019   ALKPHOS 45 01/22/2018   AST 14 01/23/2019   ALT 7 (L) 01/23/2019   PROT 6.9 01/23/2019   ALBUMIN 3.7 01/22/2018   CALCIUM 9.1 01/23/2019   ANIONGAP 7 09/30/2016   GFR 70.21 01/22/2018   Lab Results  Component Value Date   CHOL 140  01/23/2019   Lab Results  Component Value Date   HDL 52 01/23/2019   Lab Results  Component Value Date   LDLCALC 74 01/23/2019   Lab Results  Component Value Date   TRIG 63 01/23/2019   Lab Results  Component Value Date   CHOLHDL 2.7 01/23/2019   Lab Results  Component Value Date   HGBA1C 5.7 (H) 01/23/2019       Assessment & Plan:   Problem List Items Addressed This Visit      Active Problems   Diabetes mellitus type 2, diet-controlled ([place]) - Primary    hgba1c acceptable for almost a year now, diet contorlled, minimize simple carbs. Increase exercise as tolerated. Continue current meds.       Hyperlipidemia    He has also been compliant with Zocor 80 mg daily.  On highest dose of lipitor given prior h/o CAD s/p CABG. Tolerating statin, encouraged heart healthy diet, avoid trans fats, minimize simple carbs and saturated fats. Increase exercise as tolerated  Lab Results  Component Value Date   CHOL 140 01/23/2019   HDL 52 01/23/2019   LDLCALC 74 01/23/2019   LDLDIRECT 54.0 02/10/2015   TRIG 63 01/23/2019   CHOLHDL 2.7 01/23/2019         Iron deficiency anemia    He had both done on 03/19/19 by Dr. Fuller Plan- reviewed.  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    I have placed him on iron which he will likely need to take long term.       Relevant Orders   Iron, TIBC and Ferritin Panel   CBC w/Diff   Essential hypertension    Well controlled, no changes to meds. Encouraged heart healthy diet such as the DASH diet and exercise as tolerated.        Chronic pain syndrome    Chronic pain- back, hip, knee.  Feels narcotics do still take the edge off.  Indication for chronic opioid: DDD/DJD Medication and dose Norco 10/325- 1 tab every 4 hours as needed for pain # pills per month: 150 Last UDS date: 04/24/19 Pain contract signed (Y/N): Y Date narcotic  database last reviewed (include red flags): 07/8119, no red flags. He is UTD with UDS & CSC. Next due on 9.9.2021  eRx refills sent today.      Relevant Medications   HYDROcodone-acetaminophen (NORCO) 10-325 MG tablet  HYDROcodone-acetaminophen (NORCO) 10-325 MG tablet   Recurrent pulmonary emboli ([name])   History of pulmonary embolism    Chronic pain- back, hip, knee.  Feels narcotics do still take the edge off.  Indication for chronic opioid: DDD/DJD Medication and dose Norco 10/325- 1 tab every 4 hours as needed for pain # pills per month: 150 Last UDS date: 04/24/19 Pain contract signed (Y/N): Y Date narcotic database last reviewed (include red flags): 07/8119, no red flags. He is UTD with UDS & CSC. Next due on 9.9.2021       Opioid type dependence, continuous ([place])      I am having Keyion Eischeid. Bernasconi start on HYDROcodone-acetaminophen. I am also having him maintain his aspirin, calcium citrate-vitamin Glenden Rossell, multivitamin, Vitamin Keiko Myricks, fexofenadine, vitamin B-12, omeprazole, nitroGLYCERIN, ferrous sulfate, apixaban, simvastatin, HYDROcodone-acetaminophen, and ALPRAZolam.  Meds ordered this encounter  Medications  . DISCONTD: HYDROcodone-acetaminophen (NORCO) 10-325 MG tablet    Sig: Take 1 tablet by mouth every 4 (four) hours as needed for moderate pain.    Dispense:  150 tablet    Refill:  0    December  . HYDROcodone-acetaminophen (NORCO) 10-325 MG tablet    Sig: Take 1 tablet by mouth every 4 (four) hours as needed for moderate pain.    Dispense:  150 tablet    Refill:  0    January  . DISCONTD: HYDROcodone-acetaminophen (NORCO) 10-325 MG tablet    Sig: Take 1 tablet by mouth every 4 (four) hours as needed.    Dispense:  150 tablet    Refill:  0    February  . HYDROcodone-acetaminophen (NORCO) 10-325 MG tablet    Sig: Take 1 tablet by mouth every 4 (four) hours as needed for moderate pain.    Dispense:  150 tablet    Refill:  0    December  . ALPRAZolam (XANAX) 1 MG  tablet    Sig: TAKE 1 TABLET THREE TIMES DAILY AS NEEDED FOR ANXIETY    Dispense:  90 tablet    Refill:  5     Arnette Norris, MD  This visit occurred during the SARS-CoV-2 public health emergency.  Safety protocols were in place, including screening questions prior to the visit, additional usage of staff PPE, and extensive cleaning of exam room while observing appropriate contact time as indicated for disinfecting solutions.

## 2019-07-23 NOTE — Assessment & Plan Note (Signed)
hgba1c acceptable for almost a year now, diet contorlled, minimize simple carbs. Increase exercise as tolerated. Continue current meds.

## 2019-07-23 NOTE — Assessment & Plan Note (Addendum)
He has also been compliant with Zocor 80 mg daily.  On highest dose of lipitor given prior h/o CAD s/p CABG. Tolerating statin, encouraged heart healthy diet, avoid trans fats, minimize simple carbs and saturated fats. Increase exercise as tolerated  Lab Results  Component Value Date   CHOL 140 01/23/2019   HDL 52 01/23/2019   LDLCALC 74 01/23/2019   LDLDIRECT 54.0 02/10/2015   TRIG 63 01/23/2019   CHOLHDL 2.7 01/23/2019

## 2019-07-23 NOTE — Assessment & Plan Note (Signed)
Chronic pain- back, hip, knee.  Feels narcotics do still take the edge off.  Indication for chronic opioid: DDD/DJD Medication and dose Norco 10/325- 1 tab every 4 hours as needed for pain # pills per month: 150 Last UDS date: 04/24/19 Pain contract signed (Y/N): Y Date narcotic database last reviewed (include red flags): 07/8119, no red flags. He is UTD with UDS & CSC. Next due on 9.9.2021

## 2019-07-23 NOTE — Assessment & Plan Note (Signed)
Chronic pain- back, hip, knee.  Feels narcotics do still take the edge off.  Indication for chronic opioid: DDD/DJD Medication and dose Norco 10/325- 1 tab every 4 hours as needed for pain # pills per month: 150 Last UDS date: 04/24/19 Pain contract signed (Y/N): Y Date narcotic database last reviewed (include red flags): 07/8119, no red flags. He is UTD with UDS & CSC. Next due on 9.9.2021  eRx refills sent today.

## 2019-07-23 NOTE — Assessment & Plan Note (Signed)
He had both done on 03/19/19 by Dr. Fuller Plan- reviewed.  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    I have placed him on iron which he will likely need to take long term.

## 2019-07-23 NOTE — Assessment & Plan Note (Signed)
Well controlled, no changes to meds. Encouraged heart healthy diet such as the DASH diet and exercise as tolerated.  °

## 2019-07-24 ENCOUNTER — Encounter: Payer: Self-pay | Admitting: Family Medicine

## 2019-07-24 ENCOUNTER — Ambulatory Visit (INDEPENDENT_AMBULATORY_CARE_PROVIDER_SITE_OTHER): Payer: Medicare HMO | Admitting: Family Medicine

## 2019-07-24 VITALS — BP 120/70 | HR 71 | Temp 97.1°F | Ht 72.0 in | Wt 169.6 lb

## 2019-07-24 DIAGNOSIS — D509 Iron deficiency anemia, unspecified: Secondary | ICD-10-CM

## 2019-07-24 DIAGNOSIS — E119 Type 2 diabetes mellitus without complications: Secondary | ICD-10-CM | POA: Diagnosis not present

## 2019-07-24 DIAGNOSIS — E785 Hyperlipidemia, unspecified: Secondary | ICD-10-CM | POA: Diagnosis not present

## 2019-07-24 DIAGNOSIS — I2699 Other pulmonary embolism without acute cor pulmonale: Secondary | ICD-10-CM | POA: Diagnosis not present

## 2019-07-24 DIAGNOSIS — F1120 Opioid dependence, uncomplicated: Secondary | ICD-10-CM | POA: Diagnosis not present

## 2019-07-24 DIAGNOSIS — Z86711 Personal history of pulmonary embolism: Secondary | ICD-10-CM | POA: Diagnosis not present

## 2019-07-24 DIAGNOSIS — I10 Essential (primary) hypertension: Secondary | ICD-10-CM | POA: Diagnosis not present

## 2019-07-24 DIAGNOSIS — G894 Chronic pain syndrome: Secondary | ICD-10-CM | POA: Diagnosis not present

## 2019-07-24 LAB — CBC WITH DIFFERENTIAL/PLATELET
Basophils Absolute: 0.1 10*3/uL (ref 0.0–0.1)
Basophils Relative: 1.1 % (ref 0.0–3.0)
Eosinophils Absolute: 0.1 10*3/uL (ref 0.0–0.7)
Eosinophils Relative: 1.6 % (ref 0.0–5.0)
HCT: 40.8 % (ref 39.0–52.0)
Hemoglobin: 13.8 g/dL (ref 13.0–17.0)
Lymphocytes Relative: 30 % (ref 12.0–46.0)
Lymphs Abs: 1.6 10*3/uL (ref 0.7–4.0)
MCHC: 33.9 g/dL (ref 30.0–36.0)
MCV: 99 fl (ref 78.0–100.0)
Monocytes Absolute: 0.4 10*3/uL (ref 0.1–1.0)
Monocytes Relative: 7.5 % (ref 3.0–12.0)
Neutro Abs: 3.3 10*3/uL (ref 1.4–7.7)
Neutrophils Relative %: 59.8 % (ref 43.0–77.0)
Platelets: 234 10*3/uL (ref 150.0–400.0)
RBC: 4.12 Mil/uL — ABNORMAL LOW (ref 4.22–5.81)
RDW: 15.1 % (ref 11.5–15.5)
WBC: 5.5 10*3/uL (ref 4.0–10.5)

## 2019-07-24 MED ORDER — HYDROCODONE-ACETAMINOPHEN 10-325 MG PO TABS: 1.0000 | ORAL_TABLET | ORAL | 0 refills | Status: DC | PRN

## 2019-07-24 MED ORDER — ALPRAZOLAM 1 MG PO TABS: ORAL_TABLET | ORAL | 5 refills | Status: DC

## 2019-07-24 NOTE — Patient Instructions (Signed)
Great to see you. I will call you with your lab results from today and you can view them online.   

## 2019-07-25 LAB — IRON,TIBC AND FERRITIN PANEL
%SAT: 34 % (calc) (ref 20–48)
Ferritin: 53 ng/mL (ref 24–380)
Iron: 103 ug/dL (ref 50–180)
TIBC: 303 mcg/dL (calc) (ref 250–425)

## 2019-08-26 ENCOUNTER — Other Ambulatory Visit: Payer: Self-pay | Admitting: Family Medicine

## 2019-08-26 NOTE — Telephone Encounter (Signed)
Last ov 07/24/19 Last fill 09/20

## 2019-09-09 NOTE — Progress Notes (Unsigned)
Virtual Visit via Video   Due to the COVID-19 pandemic, this visit was completed with telemedicine (audio/video) technology to reduce patient and provider exposure as well as to preserve personal protective equipment.   I connected with [name] by a video enabled telemedicine application and verified that I am speaking with the correct person using two identifiers. Location patient: Home Location provider:  [facility], Office Persons participating in the virtual visit: Karl Ito, MD   I discussed the limitations of evaluation and management by telemedicine and the availability of in person appointments. The patient expressed understanding and agreed to proceed.  Care Team   Patient Care Team: Lucille Passy, MD as PCP - Cyndia Diver, MD as Consulting Physician (Cardiology) Luanne Bras, MD as Consulting Physician (Interventional Radiology)  Subjective:   HPI: Patient is needing to discuss a future doctor who can meet his needs.  ROS   Patient Active Problem List   Diagnosis Date Noted  . Bilateral knee pain 04/24/2019  . Type 2 diabetes mellitus with other circulatory complications ([place]) [id number]  . Opioid type dependence, continuous ([place]) 07/20/2017  . History of pulmonary embolism 04/20/2017  . Hx of CABG 04/20/2017  . GERD (gastroesophageal reflux disease) 01/03/2017  . Left carotid stenosis 09/30/2016  . Recurrent pulmonary emboli ([place]) 09/30/2016  . Deep vein thrombosis (DVT) of popliteal vein of left lower extremity ([place]) 09/30/2016  . CAD (coronary artery disease) 09/30/2016  . Tetrahydrocannabinol (THC) use disorder, mild, abuse 09/30/2016  . Cerebrovascular accident (CVA) due to embolism of right middle cerebral artery ([name]) - s/ IV tPA and mechanical thrombectomy   . CVA (cerebral vascular accident) ([place]) 09/27/2016  . Generalized anxiety disorder 02/09/2016  . Constipation 01/08/2015  . Other nonspecific abnormal  finding of lung field 09/02/2013  . Chronic pain syndrome 09/02/2013  . Vitamin D deficiency 09/03/2009  . CAD, ARTERY BYPASS GRAFT 05/20/2009  . Diabetes mellitus type 2, diet-controlled ([place]) 12/11/2006  . Hyperlipidemia 12/11/2006  . Iron deficiency anemia 12/11/2006  . TOBACCO ABUSE 12/11/2006  . Essential hypertension 12/11/2006  . MYOCARDIAL INFARCTION, HX OF 12/11/2006  . CARDIOMYOPATHY, ISCHEMIC 12/11/2006  . PULMONARY EMBOLISM, HX OF 12/11/2006    Social History   Tobacco Use  . Smoking status: Current Every Day Smoker    Packs/day: 1.00    Years: 40.00    Pack years: 40.00    Types: Cigarettes  . Smokeless tobacco: Former Network engineer Use Topics  . Alcohol use: No    Current Outpatient Medications:  .  ALPRAZolam (XANAX) 1 MG tablet, TAKE 1 TABLET THREE TIMES DAILY AS NEEDED FOR ANXIETY, Disp: 90 tablet, Rfl: 5 .  apixaban (ELIQUIS) 5 MG TABS tablet, Take 1 tablet (5 mg total) by mouth 2 (two) times daily., Disp: 60 tablet, Rfl: 11 .  aspirin 81 MG tablet, Take 81 mg by mouth daily.  , Disp: , Rfl:  .  Calcium Citrate-Vitamin D 500-400 MG-UNIT CHEW, Chew 1 tablet by mouth 2 (two) times daily.  , Disp: , Rfl:  .  Cholecalciferol (VITAMIN D) 2000 UNITS tablet, Take 5,000 Units by mouth daily. , Disp: , Rfl:  .  cyanocobalamin 500 MCG tablet, Take by mouth daily. , Disp: , Rfl:  .  ferrous sulfate 220 (44 Fe) MG/5ML solution, TAKE 6.8MLS (300MG  TOTAL) BY MOUTH ONCE DAILY., Disp: 150 mL, Rfl: 0 .  fexofenadine (ALLEGRA) 180 MG tablet, Take 180 mg by mouth daily as needed., Disp: , Rfl:  .  HYDROcodone-acetaminophen (NORCO) 10-325 MG tablet, Take 1 tablet by mouth every 4 (four) hours as needed for moderate pain., Disp: 150 tablet, Rfl: 0 .  HYDROcodone-acetaminophen (NORCO) 10-325 MG tablet, Take 1 tablet by mouth every 4 (four) hours as needed for moderate pain., Disp: 150 tablet, Rfl: 0 .  Multiple Vitamin (MULTIVITAMIN) tablet, Take 1 tablet by mouth daily.  , Disp: ,  Rfl:  .  nitroGLYCERIN (NITROSTAT) 0.3 MG SL tablet, Place 1 tablet (0.3 mg total) under the tongue every 5 (five) minutes as needed., Disp: 90 tablet, Rfl: 3 .  omeprazole (PRILOSEC) 20 MG capsule, Take 20 mg by mouth daily., Disp: , Rfl:  .  simvastatin (ZOCOR) 80 MG tablet, TAKE ONE TABLET BY MOUTH AT BEDTIME., Disp: 90 tablet, Rfl: 3  Allergies  Allergen Reactions  . Codeine     REACTION: makes me "looney"    Objective:   VITALS: Per patient if applicable, see vitals. GENERAL: Alert, appears well and in no acute distress. HEENT: Atraumatic, conjunctiva clear, no obvious abnormalities on inspection of external nose and ears. NECK: Normal movements of the head and neck. CARDIOPULMONARY: No increased WOB. Speaking in clear sentences. I:E ratio WNL.  MS: Moves all visible extremities without noticeable abnormality. PSYCH: Pleasant and cooperative, well-groomed. Speech normal rate and rhythm. Affect is appropriate. Insight and judgement are appropriate. Attention is focused, linear, and appropriate.  NEURO: CN grossly intact. Oriented as arrived to appointment on time with no prompting. Moves both UE equally.  SKIN: No obvious lesions, wounds, erythema, or cyanosis noted on face or hands.  Depression screen [facility] 2/9 01/23/2019 01/23/2019 08/20/2018  Decreased Interest 0 0 0  Down, Depressed, Hopeless 0 0 0  PHQ - 2 Score 0 0 0  Altered sleeping - - -  Tired, decreased energy - - -  Change in appetite - - -  Feeling bad or failure about yourself  - - -  Trouble concentrating - - -  Moving slowly or fidgety/restless - - -  Suicidal thoughts - - -  PHQ-9 Score - - -     . COVID-19 Education: The signs and symptoms of COVID-19 were discussed with the patient and how to seek care for testing if needed. The importance of social distancing was discussed today. . Reviewed expectations re: course of current medical issues. . Discussed self-management of symptoms. . Outlined signs and  symptoms indicating need for more acute intervention. . Patient verbalized understanding and all questions were answered. Marland Kitchen Health Maintenance issues including appropriate healthy diet, exercise, and smoking avoidance were discussed with patient. . See orders for this visit as documented in the electronic medical record.  Arnette Norris, MD  Records requested if needed. Time spent: *** minutes, of which >50% was spent in obtaining information about his symptoms, reviewing his previous labs, evaluations, and treatments, counseling him about his condition (please see the discussed topics above), and developing a plan to further investigate it; he had a number of questions which I addressed.   Lab Results  Component Value Date   WBC 5.5 07/24/2019   HGB 13.8 07/24/2019   HCT 40.8 07/24/2019   PLT 234.0 07/24/2019   GLUCOSE 102 (H) 01/23/2019   CHOL 140 01/23/2019   TRIG 63 01/23/2019   HDL 52 01/23/2019   LDLDIRECT 54.0 02/10/2015   LDLCALC 74 01/23/2019   ALT 7 (L) 01/23/2019   AST 14 01/23/2019   NA 141 01/23/2019   K 4.9 01/23/2019   CL 105 01/23/2019  CREATININE 1.24 01/23/2019   BUN 14 01/23/2019   CO2 28 01/23/2019   PSA 0.6 01/23/2019   INR 1.10 09/27/2016   HGBA1C 5.7 (H) 01/23/2019   MICROALBUR 0.6 01/23/2019    No results found for: TSH Lab Results  Component Value Date   WBC 5.5 07/24/2019   HGB 13.8 07/24/2019   HCT 40.8 07/24/2019   MCV 99.0 07/24/2019   PLT 234.0 07/24/2019   Lab Results  Component Value Date   NA 141 01/23/2019   K 4.9 01/23/2019   CO2 28 01/23/2019   GLUCOSE 102 (H) 01/23/2019   BUN 14 01/23/2019   CREATININE 1.24 01/23/2019   BILITOT 0.4 01/23/2019   ALKPHOS 45 01/22/2018   AST 14 01/23/2019   ALT 7 (L) 01/23/2019   PROT 6.9 01/23/2019   ALBUMIN 3.7 01/22/2018   CALCIUM 9.1 01/23/2019   ANIONGAP 7 09/30/2016   GFR 70.21 01/22/2018   Lab Results  Component Value Date   CHOL 140 01/23/2019   Lab Results  Component Value Date    HDL 52 01/23/2019   Lab Results  Component Value Date   LDLCALC 74 01/23/2019   Lab Results  Component Value Date   TRIG 63 01/23/2019   Lab Results  Component Value Date   CHOLHDL 2.7 01/23/2019   Lab Results  Component Value Date   HGBA1C 5.7 (H) 01/23/2019       Assessment & Plan:   Problem List Items Addressed This Visit    None      I am having Christian Mate. Eland maintain his aspirin, calcium citrate-vitamin D, multivitamin, Vitamin D, fexofenadine, vitamin B-12, omeprazole, nitroGLYCERIN, apixaban, simvastatin, HYDROcodone-acetaminophen, HYDROcodone-acetaminophen, ALPRAZolam, and ferrous sulfate.  No orders of the defined types were placed in this encounter.    Arnette Norris, MD

## 2019-09-10 ENCOUNTER — Ambulatory Visit: Payer: Medicare HMO | Admitting: Family Medicine

## 2019-09-10 ENCOUNTER — Encounter: Payer: Self-pay | Admitting: Family Medicine

## 2019-09-10 MED ORDER — HYDROCODONE-ACETAMINOPHEN 10-325 MG PO TABS: 1.0000 | ORAL_TABLET | ORAL | 0 refills | Status: AC | PRN

## 2019-09-10 MED ORDER — HYDROCODONE-ACETAMINOPHEN 10-325 MG PO TABS: 1.0000 | ORAL_TABLET | ORAL | 0 refills | Status: DC | PRN

## 2019-09-10 MED ORDER — ALPRAZOLAM 1 MG PO TABS: ORAL_TABLET | ORAL | 5 refills | Status: AC

## 2019-11-05 ENCOUNTER — Telehealth: Payer: Self-pay | Admitting: General Practice

## 2019-11-05 NOTE — Telephone Encounter (Signed)
Patient is calling and requesting a cal back from [place]. CB is [phone]

## 2019-11-11 NOTE — Telephone Encounter (Signed)
Talked to pt/he is going to call [facility] to sched [facility] visit with Dr. Kittie Plater dmf

## 2019-12-19 ENCOUNTER — Other Ambulatory Visit: Payer: Self-pay

## 2019-12-19 ENCOUNTER — Encounter: Payer: Self-pay | Admitting: Family Medicine

## 2019-12-19 ENCOUNTER — Ambulatory Visit (INDEPENDENT_AMBULATORY_CARE_PROVIDER_SITE_OTHER): Payer: Medicare HMO | Admitting: Family Medicine

## 2019-12-19 VITALS — BP 114/50 | HR 63 | Temp 98.5°F | Ht 72.0 in | Wt 165.2 lb

## 2019-12-19 DIAGNOSIS — G894 Chronic pain syndrome: Secondary | ICD-10-CM | POA: Diagnosis not present

## 2019-12-19 DIAGNOSIS — F1120 Opioid dependence, uncomplicated: Secondary | ICD-10-CM

## 2019-12-19 DIAGNOSIS — F411 Generalized anxiety disorder: Secondary | ICD-10-CM | POA: Diagnosis not present

## 2019-12-19 MED ORDER — HYDROCODONE-ACETAMINOPHEN 10-325 MG PO TABS: 1.0000 | ORAL_TABLET | ORAL | 0 refills | Status: AC | PRN

## 2019-12-19 NOTE — Assessment & Plan Note (Signed)
Discussed with patient that I do not prescribed daily benzos with daily opiates and would advise tapering one or both and alternative treatment. Not currently on SSRI and has anxiety every day. Given long hx would recommend tapering xanax and continuing norco. Pt noted that he does not like change and would look for another PCP. As he was previously with one of our providers, I provided a 1 month bridge or norco.

## 2019-12-19 NOTE — Progress Notes (Signed)
   Subjective:     [name] is a 64 y.o. male presenting for Medication Management (refill on Norco. )     HPI  #chronic pain - knees, ankles, hips, back - arthritis - degenerative disc disease - started on 2005 - Treatment failure: oxycodone  #anxiety - "nervous" person - alternative treatments: none - does not like change - has been on xanax for 15 years as well   Review of Systems   Social History   Tobacco Use  Smoking Status Current Every Day Smoker  . Packs/day: 1.00  . Years: 40.00  . Pack years: 40.00  . Types: Cigarettes  Smokeless Tobacco Former User        Objective:    BP Readings from Last 3 Encounters:  12/19/19 (!) 114/50  07/24/19 120/70  04/24/19 (!) 114/56   Wt Readings from Last 3 Encounters:  12/19/19 165 lb 4 oz (75 kg)  07/24/19 169 lb 9.6 oz (76.9 kg)  04/24/19 168 lb 9.6 oz (76.5 kg)    BP (!) 114/50   Pulse 63   Temp 98.5 F (36.9 C)   Ht 6' (1.829 m)   Wt 165 lb 4 oz (75 kg)   SpO2 97%   BMI 22.41 kg/m    Physical Exam Constitutional:      Appearance: Normal appearance. He is not ill-appearing or diaphoretic.  HENT:     Right Ear: External ear normal.     Left Ear: External ear normal.     Nose: Nose normal.  Eyes:     General: No scleral icterus.    Extraocular Movements: Extraocular movements intact.     Conjunctiva/sclera: Conjunctivae normal.  Cardiovascular:     Rate and Rhythm: Normal rate.  Pulmonary:     Effort: Pulmonary effort is normal.  Musculoskeletal:     Cervical back: Neck supple.  Skin:    General: Skin is warm and dry.  Neurological:     Mental Status: He is alert. Mental status is at baseline.  Psychiatric:        Mood and Affect: Mood normal.           Assessment & Plan:   Problem List Items Addressed This Visit      Other   Chronic pain syndrome - Primary    Reviewed PDMP with no red flags. Refill Norco today x 1 month.       Relevant Medications   HYDROcodone-acetaminophen (NORCO) 10-325 MG tablet   Generalized anxiety disorder    Discussed with patient that I do not prescribed daily benzos with daily opiates and would advise tapering one or both and alternative treatment. Not currently on SSRI and has anxiety every day. Given long hx would recommend tapering xanax and continuing norco. Pt noted that he does not like change and would look for another PCP. As he was previously with one of our providers, I provided a 1 month bridge or norco.       Opioid type dependence, continuous ([place])       Return if symptoms worsen or fail to improve.  Lesleigh Noe, MD

## 2019-12-19 NOTE — Patient Instructions (Signed)
As we discussed if you were to remain in my care we would need to taper either the norco or the xanax.   If we worked to stop the xanax, I would replace this with a medication like sertraline, fluoxetine, or escitalopram

## 2019-12-19 NOTE — Assessment & Plan Note (Signed)
Reviewed PDMP with no red flags. Refill Norco today x 1 month.

## 2020-01-23 DIAGNOSIS — G8929 Other chronic pain: Secondary | ICD-10-CM | POA: Diagnosis not present

## 2020-01-23 DIAGNOSIS — Z125 Encounter for screening for malignant neoplasm of prostate: Secondary | ICD-10-CM | POA: Diagnosis not present

## 2020-01-23 DIAGNOSIS — E785 Hyperlipidemia, unspecified: Secondary | ICD-10-CM | POA: Diagnosis not present

## 2020-01-23 DIAGNOSIS — E119 Type 2 diabetes mellitus without complications: Secondary | ICD-10-CM | POA: Diagnosis not present

## 2020-01-23 DIAGNOSIS — Z7189 Other specified counseling: Secondary | ICD-10-CM | POA: Diagnosis not present

## 2020-01-23 DIAGNOSIS — H01006 Unspecified blepharitis left eye, unspecified eyelid: Secondary | ICD-10-CM | POA: Diagnosis not present

## 2020-01-23 DIAGNOSIS — E559 Vitamin D deficiency, unspecified: Secondary | ICD-10-CM | POA: Diagnosis not present

## 2020-01-23 DIAGNOSIS — E611 Iron deficiency: Secondary | ICD-10-CM | POA: Diagnosis not present

## 2020-01-27 ENCOUNTER — Encounter: Payer: Medicare HMO | Admitting: Family Medicine

## 2020-03-13 DIAGNOSIS — M546 Pain in thoracic spine: Secondary | ICD-10-CM | POA: Diagnosis not present

## 2020-03-13 DIAGNOSIS — M545 Low back pain: Secondary | ICD-10-CM | POA: Diagnosis not present

## 2020-03-13 DIAGNOSIS — R109 Unspecified abdominal pain: Secondary | ICD-10-CM | POA: Diagnosis not present

## 2020-03-13 DIAGNOSIS — R911 Solitary pulmonary nodule: Secondary | ICD-10-CM | POA: Diagnosis not present

## 2020-03-13 DIAGNOSIS — M4316 Spondylolisthesis, lumbar region: Secondary | ICD-10-CM | POA: Diagnosis not present

## 2020-03-13 DIAGNOSIS — R1084 Generalized abdominal pain: Secondary | ICD-10-CM | POA: Diagnosis not present

## 2020-03-13 DIAGNOSIS — R52 Pain, unspecified: Secondary | ICD-10-CM | POA: Diagnosis not present

## 2020-03-13 DIAGNOSIS — M5489 Other dorsalgia: Secondary | ICD-10-CM | POA: Diagnosis not present

## 2020-03-13 DIAGNOSIS — E1165 Type 2 diabetes mellitus with hyperglycemia: Secondary | ICD-10-CM | POA: Diagnosis not present

## 2020-03-13 DIAGNOSIS — F17210 Nicotine dependence, cigarettes, uncomplicated: Secondary | ICD-10-CM | POA: Diagnosis not present

## 2020-03-13 DIAGNOSIS — Z951 Presence of aortocoronary bypass graft: Secondary | ICD-10-CM | POA: Diagnosis not present

## 2020-03-13 DIAGNOSIS — I499 Cardiac arrhythmia, unspecified: Secondary | ICD-10-CM | POA: Diagnosis not present

## 2020-03-13 DIAGNOSIS — Z7901 Long term (current) use of anticoagulants: Secondary | ICD-10-CM | POA: Diagnosis not present

## 2020-03-13 DIAGNOSIS — I491 Atrial premature depolarization: Secondary | ICD-10-CM | POA: Diagnosis not present

## 2020-03-13 DIAGNOSIS — Z79899 Other long term (current) drug therapy: Secondary | ICD-10-CM | POA: Diagnosis not present

## 2020-03-14 ENCOUNTER — Other Ambulatory Visit: Payer: Self-pay

## 2020-03-14 ENCOUNTER — Encounter (HOSPITAL_COMMUNITY): Payer: Self-pay | Admitting: *Deleted

## 2020-03-14 ENCOUNTER — Encounter (HOSPITAL_COMMUNITY): Admission: EM | Disposition: E | Payer: Self-pay | Source: Home / Self Care | Attending: Emergency Medicine

## 2020-03-14 ENCOUNTER — Emergency Department (HOSPITAL_COMMUNITY): Payer: Medicare HMO

## 2020-03-14 ENCOUNTER — Encounter (HOSPITAL_COMMUNITY): Payer: Self-pay | Admitting: Anesthesiology

## 2020-03-14 ENCOUNTER — Inpatient Hospital Stay (HOSPITAL_COMMUNITY)
Admission: EM | Admit: 2020-03-14 | Discharge: 2020-03-15 | DRG: 871 | Disposition: E | Payer: Medicare HMO | Attending: Emergency Medicine | Admitting: Emergency Medicine

## 2020-03-14 ENCOUNTER — Inpatient Hospital Stay (HOSPITAL_COMMUNITY): Payer: Medicare HMO

## 2020-03-14 DIAGNOSIS — Z9884 Bariatric surgery status: Secondary | ICD-10-CM

## 2020-03-14 DIAGNOSIS — Z4682 Encounter for fitting and adjustment of non-vascular catheter: Secondary | ICD-10-CM | POA: Diagnosis not present

## 2020-03-14 DIAGNOSIS — K219 Gastro-esophageal reflux disease without esophagitis: Secondary | ICD-10-CM | POA: Diagnosis present

## 2020-03-14 DIAGNOSIS — G894 Chronic pain syndrome: Secondary | ICD-10-CM | POA: Diagnosis present

## 2020-03-14 DIAGNOSIS — E785 Hyperlipidemia, unspecified: Secondary | ICD-10-CM | POA: Diagnosis present

## 2020-03-14 DIAGNOSIS — I255 Ischemic cardiomyopathy: Secondary | ICD-10-CM | POA: Diagnosis present

## 2020-03-14 DIAGNOSIS — F17210 Nicotine dependence, cigarettes, uncomplicated: Secondary | ICD-10-CM | POA: Diagnosis present

## 2020-03-14 DIAGNOSIS — Z951 Presence of aortocoronary bypass graft: Secondary | ICD-10-CM

## 2020-03-14 DIAGNOSIS — D62 Acute posthemorrhagic anemia: Secondary | ICD-10-CM | POA: Diagnosis present

## 2020-03-14 DIAGNOSIS — E669 Obesity, unspecified: Secondary | ICD-10-CM | POA: Diagnosis present

## 2020-03-14 DIAGNOSIS — K55019 Acute (reversible) ischemia of small intestine, extent unspecified: Secondary | ICD-10-CM | POA: Diagnosis not present

## 2020-03-14 DIAGNOSIS — E872 Acidosis, unspecified: Secondary | ICD-10-CM

## 2020-03-14 DIAGNOSIS — R1084 Generalized abdominal pain: Secondary | ICD-10-CM | POA: Diagnosis not present

## 2020-03-14 DIAGNOSIS — Z20822 Contact with and (suspected) exposure to covid-19: Secondary | ICD-10-CM | POA: Diagnosis present

## 2020-03-14 DIAGNOSIS — I9589 Other hypotension: Secondary | ICD-10-CM

## 2020-03-14 DIAGNOSIS — K560 Paralytic ileus: Secondary | ICD-10-CM | POA: Diagnosis present

## 2020-03-14 DIAGNOSIS — J9601 Acute respiratory failure with hypoxia: Secondary | ICD-10-CM | POA: Diagnosis not present

## 2020-03-14 DIAGNOSIS — Z86711 Personal history of pulmonary embolism: Secondary | ICD-10-CM

## 2020-03-14 DIAGNOSIS — R6521 Severe sepsis with septic shock: Secondary | ICD-10-CM | POA: Diagnosis not present

## 2020-03-14 DIAGNOSIS — R52 Pain, unspecified: Secondary | ICD-10-CM | POA: Diagnosis not present

## 2020-03-14 DIAGNOSIS — R778 Other specified abnormalities of plasma proteins: Secondary | ICD-10-CM

## 2020-03-14 DIAGNOSIS — Z9049 Acquired absence of other specified parts of digestive tract: Secondary | ICD-10-CM

## 2020-03-14 DIAGNOSIS — Z515 Encounter for palliative care: Secondary | ICD-10-CM | POA: Diagnosis not present

## 2020-03-14 DIAGNOSIS — I248 Other forms of acute ischemic heart disease: Secondary | ICD-10-CM | POA: Diagnosis present

## 2020-03-14 DIAGNOSIS — E861 Hypovolemia: Secondary | ICD-10-CM

## 2020-03-14 DIAGNOSIS — K920 Hematemesis: Secondary | ICD-10-CM | POA: Diagnosis present

## 2020-03-14 DIAGNOSIS — K55029 Acute infarction of small intestine, extent unspecified: Secondary | ICD-10-CM

## 2020-03-14 DIAGNOSIS — Z7982 Long term (current) use of aspirin: Secondary | ICD-10-CM

## 2020-03-14 DIAGNOSIS — N179 Acute kidney failure, unspecified: Secondary | ICD-10-CM | POA: Diagnosis not present

## 2020-03-14 DIAGNOSIS — K559 Vascular disorder of intestine, unspecified: Secondary | ICD-10-CM | POA: Diagnosis present

## 2020-03-14 DIAGNOSIS — Z981 Arthrodesis status: Secondary | ICD-10-CM

## 2020-03-14 DIAGNOSIS — Z66 Do not resuscitate: Secondary | ICD-10-CM | POA: Diagnosis not present

## 2020-03-14 DIAGNOSIS — J438 Other emphysema: Secondary | ICD-10-CM | POA: Diagnosis not present

## 2020-03-14 DIAGNOSIS — I10 Essential (primary) hypertension: Secondary | ICD-10-CM | POA: Diagnosis present

## 2020-03-14 DIAGNOSIS — I252 Old myocardial infarction: Secondary | ICD-10-CM

## 2020-03-14 DIAGNOSIS — E1165 Type 2 diabetes mellitus with hyperglycemia: Secondary | ICD-10-CM | POA: Diagnosis present

## 2020-03-14 DIAGNOSIS — Z452 Encounter for adjustment and management of vascular access device: Secondary | ICD-10-CM | POA: Diagnosis not present

## 2020-03-14 DIAGNOSIS — A419 Sepsis, unspecified organism: Principal | ICD-10-CM

## 2020-03-14 DIAGNOSIS — R918 Other nonspecific abnormal finding of lung field: Secondary | ICD-10-CM | POA: Diagnosis not present

## 2020-03-14 DIAGNOSIS — I2510 Atherosclerotic heart disease of native coronary artery without angina pectoris: Secondary | ICD-10-CM | POA: Diagnosis present

## 2020-03-14 DIAGNOSIS — Z955 Presence of coronary angioplasty implant and graft: Secondary | ICD-10-CM

## 2020-03-14 DIAGNOSIS — R680 Hypothermia, not associated with low environmental temperature: Secondary | ICD-10-CM | POA: Diagnosis present

## 2020-03-14 DIAGNOSIS — R571 Hypovolemic shock: Secondary | ICD-10-CM | POA: Diagnosis present

## 2020-03-14 DIAGNOSIS — N289 Disorder of kidney and ureter, unspecified: Secondary | ICD-10-CM

## 2020-03-14 DIAGNOSIS — K6389 Other specified diseases of intestine: Secondary | ICD-10-CM | POA: Diagnosis not present

## 2020-03-14 DIAGNOSIS — Z6822 Body mass index (BMI) 22.0-22.9, adult: Secondary | ICD-10-CM

## 2020-03-14 DIAGNOSIS — K439 Ventral hernia without obstruction or gangrene: Secondary | ICD-10-CM | POA: Diagnosis not present

## 2020-03-14 DIAGNOSIS — Z79899 Other long term (current) drug therapy: Secondary | ICD-10-CM

## 2020-03-14 DIAGNOSIS — I700 Atherosclerosis of aorta: Secondary | ICD-10-CM | POA: Diagnosis not present

## 2020-03-14 DIAGNOSIS — Z885 Allergy status to narcotic agent status: Secondary | ICD-10-CM

## 2020-03-14 DIAGNOSIS — Z86718 Personal history of other venous thrombosis and embolism: Secondary | ICD-10-CM

## 2020-03-14 DIAGNOSIS — F411 Generalized anxiety disorder: Secondary | ICD-10-CM | POA: Diagnosis present

## 2020-03-14 DIAGNOSIS — Z8249 Family history of ischemic heart disease and other diseases of the circulatory system: Secondary | ICD-10-CM

## 2020-03-14 DIAGNOSIS — Z7901 Long term (current) use of anticoagulants: Secondary | ICD-10-CM

## 2020-03-14 DIAGNOSIS — K3189 Other diseases of stomach and duodenum: Secondary | ICD-10-CM | POA: Diagnosis not present

## 2020-03-14 DIAGNOSIS — J9602 Acute respiratory failure with hypercapnia: Secondary | ICD-10-CM | POA: Diagnosis present

## 2020-03-14 DIAGNOSIS — Z79891 Long term (current) use of opiate analgesic: Secondary | ICD-10-CM

## 2020-03-14 DIAGNOSIS — F1290 Cannabis use, unspecified, uncomplicated: Secondary | ICD-10-CM | POA: Diagnosis present

## 2020-03-14 DIAGNOSIS — Z8673 Personal history of transient ischemic attack (TIA), and cerebral infarction without residual deficits: Secondary | ICD-10-CM

## 2020-03-14 LAB — BASIC METABOLIC PANEL
Anion gap: 18 — ABNORMAL HIGH (ref 5–15)
BUN: 32 mg/dL — ABNORMAL HIGH (ref 8–23)
CO2: 10 mmol/L — ABNORMAL LOW (ref 22–32)
Calcium: 7.9 mg/dL — ABNORMAL LOW (ref 8.9–10.3)
Chloride: 111 mmol/L (ref 98–111)
Creatinine, Ser: 2.3 mg/dL — ABNORMAL HIGH (ref 0.61–1.24)
GFR calc Af Amer: 34 mL/min — ABNORMAL LOW (ref >60)
GFR calc non Af Amer: 29 mL/min — ABNORMAL LOW (ref >60)
Glucose, Bld: 232 mg/dL — ABNORMAL HIGH (ref 70–99)
Potassium: 5.1 mmol/L (ref 3.5–5.1)
Sodium: 139 mmol/L (ref 135–145)

## 2020-03-14 LAB — CBC WITH DIFFERENTIAL/PLATELET
Abs Immature Granulocytes: 0.09 10*3/uL — ABNORMAL HIGH (ref 0.00–0.07)
Basophils Absolute: 0 10*3/uL (ref 0.0–0.1)
Basophils Relative: 0 %
Eosinophils Absolute: 0 10*3/uL (ref 0.0–0.5)
Eosinophils Relative: 0 %
HCT: 46.5 % (ref 39.0–52.0)
Hemoglobin: 14.6 g/dL (ref 13.0–17.0)
Immature Granulocytes: 0 %
Lymphocytes Relative: 6 %
Lymphs Abs: 1.3 10*3/uL (ref 0.7–4.0)
MCH: 32.5 pg (ref 26.0–34.0)
MCHC: 31.4 g/dL (ref 30.0–36.0)
MCV: 103.6 fL — ABNORMAL HIGH (ref 80.0–100.0)
Monocytes Absolute: 1.3 10*3/uL — ABNORMAL HIGH (ref 0.1–1.0)
Monocytes Relative: 6 %
Neutro Abs: 17.8 10*3/uL — ABNORMAL HIGH (ref 1.7–7.7)
Neutrophils Relative %: 88 %
Platelets: 232 10*3/uL (ref 150–400)
RBC: 4.49 MIL/uL (ref 4.22–5.81)
RDW: 14.1 % (ref 11.5–15.5)
WBC: 20.5 10*3/uL — ABNORMAL HIGH (ref 4.0–10.5)
nRBC: 0 % (ref 0.0–0.2)

## 2020-03-14 LAB — TROPONIN I (HIGH SENSITIVITY)
Troponin I (High Sensitivity): 286 ng/L (ref <18)
Troponin I (High Sensitivity): 269 ng/L (ref <18)

## 2020-03-14 LAB — I-STAT ARTERIAL BLOOD GAS, ED
Acid-base deficit: 20 mmol/L — ABNORMAL HIGH (ref 0.0–2.0)
Bicarbonate: 14.2 mmol/L — ABNORMAL LOW (ref 20.0–28.0)
Calcium, Ion: 0.94 mmol/L — ABNORMAL LOW (ref 1.15–1.40)
HCT: 33 % — ABNORMAL LOW (ref 39.0–52.0)
Hemoglobin: 11.2 g/dL — ABNORMAL LOW (ref 13.0–17.0)
O2 Saturation: 100 %
Potassium: 5.4 mmol/L — ABNORMAL HIGH (ref 3.5–5.1)
Sodium: 144 mmol/L (ref 135–145)
TCO2: 17 mmol/L — ABNORMAL LOW (ref 22–32)
pCO2 arterial: 79.5 mmHg (ref 32.0–48.0)
pH, Arterial: 6.859 — CL (ref 7.350–7.450)
pO2, Arterial: 329 mmHg — ABNORMAL HIGH (ref 83.0–108.0)

## 2020-03-14 LAB — COMPREHENSIVE METABOLIC PANEL
ALT: 24 U/L (ref 0–44)
AST: 64 U/L — ABNORMAL HIGH (ref 15–41)
Albumin: 4.3 g/dL (ref 3.5–5.0)
Alkaline Phosphatase: 53 U/L (ref 38–126)
Anion gap: 18 — ABNORMAL HIGH (ref 5–15)
BUN: 27 mg/dL — ABNORMAL HIGH (ref 8–23)
CO2: 15 mmol/L — ABNORMAL LOW (ref 22–32)
Calcium: 9.1 mg/dL (ref 8.9–10.3)
Chloride: 106 mmol/L (ref 98–111)
Creatinine, Ser: 1.99 mg/dL — ABNORMAL HIGH (ref 0.61–1.24)
GFR calc Af Amer: 41 mL/min — ABNORMAL LOW (ref >60)
GFR calc non Af Amer: 35 mL/min — ABNORMAL LOW (ref >60)
Glucose, Bld: 285 mg/dL — ABNORMAL HIGH (ref 70–99)
Potassium: 4.1 mmol/L (ref 3.5–5.1)
Sodium: 139 mmol/L (ref 135–145)
Total Bilirubin: 1.2 mg/dL (ref 0.3–1.2)
Total Protein: 8.2 g/dL — ABNORMAL HIGH (ref 6.5–8.1)

## 2020-03-14 LAB — POC OCCULT BLOOD, ED: Fecal Occult Bld: POSITIVE — AB

## 2020-03-14 LAB — I-STAT CHEM 8, ED
BUN: 24 mg/dL — ABNORMAL HIGH (ref 8–23)
Calcium, Ion: 0.85 mmol/L — CL (ref 1.15–1.40)
Chloride: 118 mmol/L — ABNORMAL HIGH (ref 98–111)
Creatinine, Ser: 2 mg/dL — ABNORMAL HIGH (ref 0.61–1.24)
Glucose, Bld: 127 mg/dL — ABNORMAL HIGH (ref 70–99)
HCT: 27 % — ABNORMAL LOW (ref 39.0–52.0)
Hemoglobin: 9.2 g/dL — ABNORMAL LOW (ref 13.0–17.0)
Potassium: 3.9 mmol/L (ref 3.5–5.1)
Sodium: 150 mmol/L — ABNORMAL HIGH (ref 135–145)
TCO2: 15 mmol/L — ABNORMAL LOW (ref 22–32)

## 2020-03-14 LAB — APTT: aPTT: 28 seconds (ref 24–36)

## 2020-03-14 LAB — SAMPLE TO BLOOD BANK

## 2020-03-14 LAB — LACTIC ACID, PLASMA
Lactic Acid, Venous: >11 mmol/L (ref 0.5–1.9)
Lactic Acid, Venous: 8 mmol/L (ref 0.5–1.9)

## 2020-03-14 LAB — CBC
HCT: 37.1 % — ABNORMAL LOW (ref 39.0–52.0)
Hemoglobin: 11.5 g/dL — ABNORMAL LOW (ref 13.0–17.0)
MCH: 32.1 pg (ref 26.0–34.0)
MCHC: 31 g/dL (ref 30.0–36.0)
MCV: 103.6 fL — ABNORMAL HIGH (ref 80.0–100.0)
Platelets: 195 10*3/uL (ref 150–400)
RBC: 3.58 MIL/uL — ABNORMAL LOW (ref 4.22–5.81)
RDW: 14 % (ref 11.5–15.5)
WBC: 12.7 10*3/uL — ABNORMAL HIGH (ref 4.0–10.5)
nRBC: 0 % (ref 0.0–0.2)

## 2020-03-14 LAB — LIPASE, BLOOD: Lipase: 39 U/L (ref 11–51)

## 2020-03-14 LAB — PROTIME-INR
INR: 1.3 — ABNORMAL HIGH (ref 0.8–1.2)
Prothrombin Time: 16 seconds — ABNORMAL HIGH (ref 11.4–15.2)

## 2020-03-14 LAB — CBG MONITORING, ED: Glucose-Capillary: 233 mg/dL — ABNORMAL HIGH (ref 70–99)

## 2020-03-14 LAB — SARS CORONAVIRUS 2 BY RT PCR (HOSPITAL ORDER, PERFORMED IN ~~LOC~~ HOSPITAL LAB): SARS Coronavirus 2: NEGATIVE

## 2020-03-14 LAB — ETHANOL: Alcohol, Ethyl (B): <10 mg/dL (ref <10)

## 2020-03-14 SURGERY — LAPAROTOMY, EXPLORATORY
Anesthesia: General

## 2020-03-14 MED ORDER — EPINEPHRINE 0.1 MG/10ML (10 MCG/ML) SYRINGE FOR IV PUSH (FOR BLOOD PRESSURE SUPPORT)
100.0000 ug | PREFILLED_SYRINGE | Freq: Once | INTRAVENOUS | Status: AC
  Administered 2020-03-14: 100 ug via INTRAVENOUS

## 2020-03-14 MED ORDER — EPINEPHRINE PF 1 MG/ML IJ SOLN
0.5000 ug/min | INTRAVENOUS | Status: DC
  Filled 2020-03-14: qty 4

## 2020-03-14 MED ORDER — NOREPINEPHRINE 4 MG/250ML-% IV SOLN
0.0000 ug/min | INTRAVENOUS | Status: DC
  Filled 2020-03-14: qty 250

## 2020-03-14 MED ORDER — NOREPINEPHRINE 4 MG/250ML-% IV SOLN
INTRAVENOUS | Status: AC
  Administered 2020-03-14: 2 ug/min via INTRAVENOUS
  Filled 2020-03-14: qty 250

## 2020-03-14 MED ORDER — SODIUM CHLORIDE 0.9 % IV SOLN
80.0000 mg | Freq: Once | INTRAVENOUS | Status: AC
  Administered 2020-03-14: 80 mg via INTRAVENOUS
  Filled 2020-03-14: qty 80

## 2020-03-14 MED ORDER — ROCURONIUM BROMIDE 50 MG/5ML IV SOLN
80.0000 mg | Freq: Once | INTRAVENOUS | Status: AC
  Administered 2020-03-14: 80 mg via INTRAVENOUS

## 2020-03-14 MED ORDER — ETOMIDATE 2 MG/ML IV SOLN
20.0000 mg | Freq: Once | INTRAVENOUS | Status: AC
  Administered 2020-03-14: 20 mg via INTRAVENOUS

## 2020-03-14 MED ORDER — IOHEXOL 9 MG/ML PO SOLN
ORAL | Status: AC
  Filled 2020-03-14: qty 500

## 2020-03-14 MED ORDER — SODIUM CHLORIDE 0.9 % IV BOLUS
1000.0000 mL | Freq: Once | INTRAVENOUS | Status: AC
  Administered 2020-03-14: 1000 mL via INTRAVENOUS

## 2020-03-14 MED ORDER — SODIUM CHLORIDE 0.9 % IV BOLUS
500.0000 mL | Freq: Once | INTRAVENOUS | Status: AC
  Administered 2020-03-14: 500 mL via INTRAVENOUS

## 2020-03-14 MED ORDER — LACTATED RINGERS IV BOLUS
1000.0000 mL | Freq: Once | INTRAVENOUS | Status: AC
  Administered 2020-03-14: 1000 mL via INTRAVENOUS

## 2020-03-14 MED ORDER — MORPHINE SULFATE (PF) 2 MG/ML IV SOLN: 2.0000 mg | INTRAVENOUS | Status: DC | PRN

## 2020-03-14 MED ORDER — DEXTROSE 5 % IV SOLN: INTRAVENOUS | Status: DC

## 2020-03-14 MED ORDER — PIPERACILLIN-TAZOBACTAM 3.375 G IVPB
3.3750 g | Freq: Once | INTRAVENOUS | Status: AC
  Administered 2020-03-14: 3.375 g via INTRAVENOUS
  Filled 2020-03-14: qty 50

## 2020-03-14 MED ORDER — PANTOPRAZOLE SODIUM 40 MG IV SOLR: 40.0000 mg | Freq: Two times a day (BID) | INTRAVENOUS | Status: DC

## 2020-03-14 MED ORDER — SODIUM CHLORIDE 0.9 % IV SOLN: INTRAVENOUS | Status: DC

## 2020-03-14 MED ORDER — SODIUM BICARBONATE 8.4 % IV SOLN
50.0000 meq | Freq: Once | INTRAVENOUS | Status: AC
  Administered 2020-03-14: 50 meq via INTRAVENOUS

## 2020-03-14 MED ORDER — LORAZEPAM 2 MG/ML IJ SOLN: 2.0000 mg | INTRAMUSCULAR | Status: DC | PRN

## 2020-03-14 MED ORDER — MORPHINE BOLUS VIA INFUSION
5.0000 mg | INTRAVENOUS | Status: DC | PRN
  Filled 2020-03-14: qty 5

## 2020-03-14 MED ORDER — SODIUM CHLORIDE 0.9 % IV SOLN
8.0000 mg/h | INTRAVENOUS | Status: DC
  Administered 2020-03-14: 8 mg/h via INTRAVENOUS
  Filled 2020-03-14 (×7): qty 80

## 2020-03-14 MED ORDER — POLYVINYL ALCOHOL 1.4 % OP SOLN
1.0000 [drp] | Freq: Four times a day (QID) | OPHTHALMIC | Status: DC | PRN
  Filled 2020-03-14: qty 15

## 2020-03-14 MED ORDER — PIPERACILLIN-TAZOBACTAM IN DEX 2-0.25 GM/50ML IV SOLN: 2.2500 g | Freq: Once | INTRAVENOUS | Status: DC

## 2020-03-14 MED ORDER — MORPHINE SULFATE (PF) 4 MG/ML IV SOLN
4.0000 mg | Freq: Once | INTRAVENOUS | Status: AC
  Administered 2020-03-14: 4 mg via INTRAVENOUS
  Filled 2020-03-14: qty 1

## 2020-03-14 MED ORDER — MORPHINE 100MG IN NS 100ML (1MG/ML) PREMIX INFUSION
0.0000 mg/h | INTRAVENOUS | Status: DC
  Administered 2020-03-14: 2 mg/h via INTRAVENOUS
  Filled 2020-03-14: qty 100

## 2020-03-14 MED ORDER — DIPHENHYDRAMINE HCL 50 MG/ML IJ SOLN: 25.0000 mg | INTRAMUSCULAR | Status: DC | PRN

## 2020-03-15 LAB — TYPE AND SCREEN
ABO/RH(D): A POS
Antibody Screen: NEGATIVE
Unit division: 0
Unit division: 0

## 2020-03-15 LAB — BPAM RBC
Blood Product Expiration Date: 202108072359
Blood Product Expiration Date: 202108072359
ISSUE DATE / TIME: 202107310952
ISSUE DATE / TIME: 202107311005
Unit Type and Rh: 5100
Unit Type and Rh: 5100

## 2020-03-15 NOTE — Progress Notes (Signed)
[facility]  Have attempted to call daughter Derinda Late 4 times. No answer. Messages left to call Ed at [facility].    CXR without obvious pneumothorax, L IJ at the SVC/Atrial junction.   Curlene Labrum, MD [facility] [address] [place], [place] [address] [phone] (office)

## 2020-03-15 NOTE — ED Triage Notes (Signed)
Pt c/o abd pain that started today, was seen at [facility] and was sent home, pt had episode of bright red  bloody vomit after arrival to er, pt pale, diaphoretic, pulse ox 90% on room air,

## 2020-03-15 NOTE — ED Notes (Signed)
Daughter, Jordan Hawks called spoke with Sharyn Lull after Pt had left with [facility]

## 2020-03-15 NOTE — ED Notes (Signed)
TIME OF DEATH PRONOUNCED BY THIS RN AND JESSICA EASLEY RN. TIME OF DEATH 1248 PM 2020/03/25

## 2020-03-15 NOTE — H&P (Addendum)
NAME:  [name], MRN:  [id number], DOB:  [birth year], LOS: 0 ADMISSION DATE:  April 05, 2020, CONSULTATION DATE:  7/31 REFERRING MD:  Dr. Tomi Bamberger, CHIEF COMPLAINT:  Abd pain, ematemesis    Brief History   64 y/o M who presented to [facility] 7/31 with reports of abdominal pain and hematemesis.  Work up consistent with ischemic bowel and profound acidosis.  Transferred to [facility] for evaluation.   History of present illness   64 y/o M who presented to [facility] on 7/31 with reports of abdominal pain.  Patient was reportedly seen in the [facility] ER for abdominal pain and sent home.  Per documentation, he had been having increased gas and abdominal discomfort since 7/23 and then developed diarrhea.  After returning home he had an episode of bright red bloody vomiting.    On arrival to the emergency room at [facility] he was noted to be pale, diaphoretic with saturations of 90% on room air. He was in shock with systolic pressures in the 50-60's.  The patient was intubated in the ER and central access place for resuscitation.  CT of the ABD, pelvis demonstrated findings suspicious for bowel ischemia, subtle evidence of pneumatosis intestinalis of the distal small bowel with more definitive portal venous air extending along the small bowel mesentery.  Minimal pneumoperitoneum. Significant distention of the lower esophagus containing material of increased hounsfield units consistent with hemorrhage.  Small bowel and colon are mildly distended diffusely with air-fluid level consistent with a diffuse adynamic ileus.  There is significant vascular disease reflected by diffuse atherosclerotic calcifications along the aorta and its branch vessels.  Lung bases changes consistent with parenchymal fibrosis. Initial labs notable for Na 139, K 4.1, CO2 15, glucose 285, AG 18, HS-troponin 286, WBC 20.5, Hgb 14.6, MCV 103.6, and platelets 232.  CXR negative for acute process. He was transferred to [facility] for surgical evaluation.  On arrival  he had a near arrest episode on max dose levophed requiring and epinephrine push.  He did not require CPR.  The patient was transfused emergency release blood.  ABG evaluation 6.85 / 79.5 / 329 / 14.  He was evaluated by [facility] and family elected not to pursue laparotomy and proceed with comfort measures.    Past Medical History  Tobacco Abuse PE  Obesity s/p Gastric Bypass, ~300lb weight loss  CAD s/p MI  HTN HLD DM  Anemia   Significant Hospital Events   7/31 Admit  Consults:  CCS   Procedures:  ETT 7/31 >>  L IJ TLC 7/31 >>   Significant Diagnostic Tests:  CT ABD/Pelvis 7/31 >> findings suspicious for bowel ischemia, subtle evidence of pneumatosis intestinalis of the distal small bowel with more definitive portal venous air extending along the small bowel mesentery.  Minimal pneumoperitoneum. Significant distention of the lower esophagus containing material of increased hounsfield units consistent with hemorrhage.  Small bowel and colon are mildly distended diffusely with air-fluid level consistent with a diffuse adynamic ileus.  There is significant vascular disease reflected by diffuse atherosclerotic calcifications along the aorta and its branch vessels.  Lung bases changes consistent with parenchymal fibrosis.   Micro Data:  COVID 7/31 >> negative   Antimicrobials:     Interim history/subjective:  As above   Objective   Blood pressure 99/69, pulse 97, temperature (!) 96.5 F (35.8 C), temperature source Rectal, resp. rate (!) 25, height 6' (1.829 m), weight 76.2 kg, SpO2 100 %.    Vent Mode: PRVC FiO2 (%):  [  100 %] 100 % Set Rate:  [16 bmp-25 bmp] 25 bmp Vt Set:  [620 mL] 620 mL PEEP:  [5 cmH20] 5 cmH20 Plateau Pressure:  [21 cmH20] 21 cmH20  No intake or output data in the 24 hours ending 03-29-20 1048 Filed Weights   March 29, 2020 0114  Weight: 76.2 kg    Examination: General: critically ill appearing male lying on ER stretcher in NAD HENT: mm pale, dry, ETT,  pupils slugghis  Lungs: non-labored on vent, lungs clear bilaterally  Cardiovascular: s1s2 rrr, no m/r/g  Abdomen: distended, firm, decreased bowel sounds  Extremities: pale, cool Neuro: obtunded   Resolved Hospital Problem list      Assessment & Plan:   Ischemic Bowel  Hematemesis  Acute Hypercarbic Respiratory Failure  Macrocytic + Acute Blood Loss Anemia  Circulatory / Hypovolemic Shock in setting of Ischemic Bowel  Hx CAD s/p MI HLD  AKI  Severe Metabolic Acidosis  DM with Hyperglycemia  Demand Ischemia / Elevated Troponin  Tobacco Abuse   Plan: Begin morphine gtt for patient comfort PRN ativan for sedation  Wean levophed to off once morphine initiated  No further labs / interventions  Comfort measures  Open visitation, family support offered  NPO DNR in place  Will revisit with family regarding compassionate extubation if needed  Best practice:  Diet: NPO  Pain/Anxiety/Delirium protocol (if indicated): morphine  VAP protocol (if indicated): n/a  DVT prophylaxis: n/a  GI prophylaxis: n/a  Glucose control: na/  Mobility: bed rest  Code Status: DNR  Family Communication: Daughter updated at bedside per Dr. Lamonte Sakai 7/31.  Disposition: Comfort measures   Labs   CBC: Recent Labs  Lab 2020-03-29 0104 2020-03-29 0605 Mar 29, 2020 0953 29-Mar-2020 1027  WBC 20.5* 12.7*  --   --   NEUTROABS 17.8*  --   --   --   HGB 14.6 11.5* 9.2* 11.2*  HCT 46.5 37.1* 27.0* 33.0*  MCV 103.6* 103.6*  --   --   PLT 232 195  --   --     Basic Metabolic Panel: Recent Labs  Lab Mar 29, 2020 0104 03-29-20 0605 29-Mar-2020 0953 29-Mar-2020 1027  NA 139 139 150* 144  K 4.1 5.1 3.9 5.4*  CL 106 111 118*  --   CO2 15* 10*  --   --   GLUCOSE 285* 232* 127*  --   BUN 27* 32* 24*  --   CREATININE 1.99* 2.30* 2.00*  --   CALCIUM 9.1 7.9*  --   --    GFR: Estimated Creatinine Clearance: 41.3 mL/min (A) (by C-G formula based on SCr of 2 mg/dL (H)). Recent Labs  Lab Mar 29, 2020 0104  March 29, 2020 0321 03-29-20 0605  WBC 20.5*  --  12.7*  LATICACIDVEN >11* 8.0*  --     Liver Function Tests: Recent Labs  Lab 03/29/20 0104  AST 64*  ALT 24  ALKPHOS 53  BILITOT 1.2  PROT 8.2*  ALBUMIN 4.3   Recent Labs  Lab March 29, 2020 0104  LIPASE 39   No results for input(s): AMMONIA in the last 168 hours.  ABG    Component Value Date/Time   PHART 6.859 (LL) Mar 29, 2020 1027   PCO2ART 79.5 (HH) 2020/03/29 1027   PO2ART 329 (H) 03-29-20 1027   HCO3 14.2 (L) March 29, 2020 1027   TCO2 17 (L) 29-Mar-2020 1027   ACIDBASEDEF 20.0 (H) 29-Mar-2020 1027   O2SAT 100.0 03-29-2020 1027     Coagulation Profile: Recent Labs  Lab 03-29-20 0104  INR 1.3*  Cardiac Enzymes: No results for input(s): CKTOTAL, CKMB, CKMBINDEX, TROPONINI in the last 168 hours.  HbA1C: Hemoglobin A1C  Date/Time Value Ref Range Status  08/20/2018 10:19 AM 5.7 (A) 4.0 - 5.6 % Final  11/22/2017 11:15 AM 5.8  Final   HbA1c POC (<> result, manual entry)  Date/Time Value Ref Range Status  08/20/2018 10:19 AM 5.7 4.0 - 5.6 % Final   Hgb A1c MFr Bld  Date/Time Value Ref Range Status  01/23/2019 01:16 PM 5.7 (H) <5.7 % of total Hgb Final    Comment:    For someone without known diabetes, a hemoglobin  A1c value between 5.7% and 6.4% is consistent with prediabetes and should be confirmed with a  follow-up test. . For someone with known diabetes, a value <7% indicates that their diabetes is well controlled. A1c targets should be individualized based on duration of diabetes, age, comorbid conditions, and other considerations. . This assay result is consistent with an increased risk of diabetes. . Currently, no consensus exists regarding use of hemoglobin A1c for diagnosis of diabetes for children. Marland Kitchen   09/28/2016 04:21 AM 5.7 (H) 4.8 - 5.6 % Final    Comment:    (NOTE)         Pre-diabetes: 5.7 - 6.4         Diabetes: >6.4         Glycemic control for adults with diabetes: <7.0      CBG: Recent Labs  Lab 04/10/20 0103  GLUCAP 233*    Review of Systems:   Unable to complete due to AMS / mechanical ventilation. Information obtained from staff at bedside and prior medical documentation.   Past Medical History  He,  has a past medical history of Anemia, Arthritis, CAD (coronary artery disease), Diabetes mellitus, Hyperlipidemia, Hypertension, Left ventricular dysfunction, Low back pain, MI (myocardial infarction) ([place]), Obesity, PE (pulmonary embolism), Stroke ([place]), Tobacco abuse, and Tubular adenoma of colon (05/2015).   Surgical History    Past Surgical History:  Procedure Laterality Date  . CHOLECYSTECTOMY    . CORONARY ARTERY BYPASS GRAFT    . CORONARY STENT PLACEMENT     3 all in right side  . GASTRIC BYPASS    . HERNIA REPAIR    . IR GENERIC HISTORICAL  09/27/2016   IR ANGIO INTRA EXTRACRAN SEL COM CAROTID INNOMINATE UNI L MOD SED 09/27/2016 Luanne Bras, MD [facility]  . IR GENERIC HISTORICAL  09/27/2016   IR PERCUTANEOUS ART THROMBECTOMY/INFUSION INTRACRANIAL INC DIAG ANGIO 09/27/2016 Luanne Bras, MD [facility]  . IR GENERIC HISTORICAL  11/07/2016   IR RADIOLOGIST EVAL & MGMT 11/07/2016 [facility]  . Neck Fusion    . RADIOLOGY WITH ANESTHESIA N/A 09/27/2016   Procedure: RADIOLOGY WITH ANESTHESIA;  Surgeon: Luanne Bras, MD;  Location: [place];  Service: Radiology;  Laterality: N/A;  . TONSILLECTOMY       Social History   reports that he has been smoking cigarettes. He has a 40.00 pack-year smoking history. He has quit using smokeless tobacco. He reports current drug use. Drug: Marijuana. He reports that he does not drink alcohol.   Family History   His family history includes Arthritis in his brother; Emphysema in his mother; Heart attack in his father; Heart disease in his brother and daughter; Hemophilia in his brother. There is no history of Colon cancer.   Allergies Allergies  Allergen Reactions  . Codeine     REACTION:  makes me "looney"     Home  Medications  Prior to Admission medications   Medication Sig Start Date End Date Taking? Authorizing Provider  ALPRAZolam Duanne Moron) 1 MG tablet TAKE 1 TABLET THREE TIMES DAILY AS NEEDED FOR ANXIETY 09/10/19   Lucille Passy, MD  apixaban (ELIQUIS) 5 MG TABS tablet Take 1 tablet (5 mg total) by mouth 2 (two) times daily. 05/13/19   Lucille Passy, MD  aspirin 81 MG tablet Take 81 mg by mouth daily.      [provider]  Calcium Citrate-Vitamin D 500-400 MG-UNIT CHEW Chew 1 tablet by mouth 2 (two) times daily.      [provider]  Cholecalciferol (VITAMIN D) 2000 UNITS tablet Take 5,000 Units by mouth daily.     [provider]  cyanocobalamin 500 MCG tablet Take by mouth daily.     [provider]  ferrous sulfate 220 (44 Fe) MG/5ML solution TAKE 6.8MLS (300MG  TOTAL) BY MOUTH ONCE DAILY. 08/26/19   Lucille Passy, MD  fexofenadine (ALLEGRA) 180 MG tablet Take 180 mg by mouth daily as needed. 12/27/11   Lucille Passy, MD  HYDROcodone-acetaminophen [facility]) 10-325 MG tablet Take 1 tablet by mouth every 4 (four) hours as needed for moderate pain. 09/10/19   Lucille Passy, MD  HYDROcodone-acetaminophen [facility]) 10-325 MG tablet Take 1 tablet by mouth every 4 (four) hours as needed for moderate pain. 12/19/19   Lesleigh Noe, MD  Multiple Vitamin (MULTIVITAMIN) tablet Take 1 tablet by mouth daily.      [provider]  nitroGLYCERIN (NITROSTAT) 0.3 MG SL tablet Place 1 tablet (0.3 mg total) under the tongue every 5 (five) minutes as needed. 08/20/18   Lucille Passy, MD  nitroGLYCERIN (NITROSTAT) 0.4 MG SL tablet Place 0.4 mg under the tongue every 5 (five) minutes as needed for chest pain. 01/09/20   [provider]  omeprazole (PRILOSEC) 20 MG capsule Take 20 mg by mouth daily.    [provider]  simvastatin (ZOCOR) 80 MG tablet TAKE ONE TABLET BY MOUTH AT BEDTIME. 06/25/19   Lucille Passy, MD  trimethoprim-polymyxin b  Mayra Neer) ophthalmic solution  01/23/20   [provider]     Critical care time: 34 minutes      Noe Gens, MSN, NP-C [facility] 03/30/2020, 10:49 AM   Please see [email or web address] for pager details.

## 2020-03-15 NOTE — ED Notes (Signed)
E-Link Notified of pt death by this RN.

## 2020-03-15 NOTE — Progress Notes (Signed)
  Chaplain offered grief support, got [facility] info and funeral home, [place] [facility] [place] in [place], (provided to [employer]) and explained next steps.  GF is en route.  Only daughter "Shela Leff" is with husband, and pt friend.    Please call if further support is needed.   Minus Liberty, Chaplain     04-04-20 1300  Clinical Encounter Type  Visited With Family  Visit Type Death  Stress Factors  Family Stress Factors Major life changes

## 2020-03-15 NOTE — Progress Notes (Signed)
[facility]  Patient gave me Festus Aloe, friend, number to call and contact. He gives her permission to be apart of his care.  She is aware that he is critically sick and that he is being transferred and getting intubated at this time. Aware that patient may not survive and may not even make it to transfer or surgery. She is heading [facility] as she is over 1 hr away from the area.   [facility] in route per report.  Melba  Cell - [phone] Home- [place], MD [facility] [address] [place], [place] [address] [phone] (office)

## 2020-03-15 NOTE — ED Notes (Signed)
Warming blanket applied to pt

## 2020-03-15 NOTE — ED Provider Notes (Signed)
Patient arrives from [facility] with known concern for ischemic bowel, critically ill on Levophed with evidence of GI bleeding. Upon moving to the stretcher patient lost pulses with decreased blood pressure and intact heart rate. Levophed was increased to 80 mg and he was given 0.1 of epinephrine with improvement in blood pressure and pulse return. Patient given emergency release blood due to concern for hypovolemic shock. After these measures were taken and patient received an additional liter of LR blood pressure remained improved and norepinephrine was decreased to 30. Currently patient is receiving his second unit of blood. Critical care was notified that he has arrived.  CRITICAL CARE Performed by: Elainah Rhyne Total critical care time: 30 minutes Critical care time was exclusive of separately billable procedures and treating other patients. Critical care was necessary to treat or prevent imminent or life-threatening deterioration. Critical care was time spent personally by me on the following activities: development of treatment plan with patient and/or surrogate as well as nursing, discussions with consultants, evaluation of patient's response to treatment, examination of patient, obtaining history from patient or surrogate, ordering and performing treatments and interventions, ordering and review of laboratory studies, ordering and review of radiographic studies, pulse oximetry and re-evaluation of patient's condition.    Blanchie Dessert, MD 04-05-2020 1015

## 2020-03-15 NOTE — Consult Note (Signed)
[name] [birth year]  [id number].    Requesting MD: Dr. Blanchie Dessert Chief Complaint/Reason for Consult: ischemic bowel  HPI:  This is a late entry, but we were asked to see this patient that was transferred down from [facility] secondary to ischemic bowel.  Limited history is known as he is critically ill and on a vent.  He is s/p gastric bypass in 2005.  He was 400# and has lost down to 77kg.  He has done well with this since that time.  His friend is at the bedside who says he has been having pain in his abdomen for the last week.  She does not know if he has been eating.  She really doesn't know much other history except he went to [facility] yesterday and had a CT scan that was normal by report.  He was discharged.  His pain persistently worsened and he went to [facility] today where he had a CT scan that revealed small bowel ischemia with pneumatosis.  He has significantly deranged ABGs and labs.  He has been put on a vent and tx to [facility] for CCM and our services.      ROS: ROS: Unable to do as he is on a vent.  Family History  Problem Relation Age of Onset  . Emphysema Mother   . Heart attack Father   . Heart disease Daughter   . Heart disease Brother   . Arthritis Brother   . Hemophilia Brother   . Colon cancer Neg Hx     Past Medical History:  Diagnosis Date  . Anemia   . Arthritis   . CAD (coronary artery disease)   . Diabetes mellitus   . Hyperlipidemia   . Hypertension   . Left ventricular dysfunction   . Low back pain   . MI (myocardial infarction) ([place])   . Obesity   . PE (pulmonary embolism)   . Stroke ([place])   . Tobacco abuse   . Tubular adenoma of colon 05/2015    Past Surgical History:  Procedure Laterality Date  . CHOLECYSTECTOMY    . CORONARY ARTERY BYPASS GRAFT    . CORONARY STENT PLACEMENT     3 all in right side  . GASTRIC BYPASS    . HERNIA REPAIR    . IR GENERIC HISTORICAL  09/27/2016   IR ANGIO INTRA EXTRACRAN SEL COM CAROTID  INNOMINATE UNI L MOD SED 09/27/2016 Luanne Bras, MD [facility]-INTER[facility] RAD  . IR GENERIC HISTORICAL  09/27/2016   IR PERCUTANEOUS ART THROMBECTOMY/INFUSION INTRACRANIAL INC DIAG ANGIO 09/27/2016 Luanne Bras, MD [facility]  . IR GENERIC HISTORICAL  11/07/2016   IR RADIOLOGIST EVAL & MGMT 11/07/2016 [facility]  . Neck Fusion    . RADIOLOGY WITH ANESTHESIA N/A 09/27/2016   Procedure: RADIOLOGY WITH ANESTHESIA;  Surgeon: Luanne Bras, MD;  Location: [place];  Service: Radiology;  Laterality: N/A;  . TONSILLECTOMY      Social History:  reports that he has been smoking cigarettes. He has a 40.00 pack-year smoking history. He has quit using smokeless tobacco. He reports current drug use. Drug: Marijuana. He reports that he does not drink alcohol.  Allergies:  Allergies  Allergen Reactions  . Codeine     REACTION: makes me "looney"    (Not in a hospital admission)    Physical Exam: Blood pressure 99/69, pulse 97, temperature (!) 96.5 F (35.8 C), temperature source Rectal, resp. rate (!) 25, height 6' (1.829 m), weight 77  kg, SpO2 100 %. General: critically ill on a vent HEENT: head is normocephalic, atraumatic.  Eyes closed.  Ears and nose without any masses or lesions.  Mouth is pink with ETT in place Heart: regular, rate, and rhythm.  Normal s1,s2. No obvious murmurs, gallops, or rubs noted.  Palpable radial and pedal pulses bilaterally Lungs: CTAB, no wheezes, rhonchi, or rales noted.  Respiratory effort nonlabored Abd: distended, absent BS, unable to assess tenderness as he is on a vent, sedated, no masses, hernias, or organomegaly Skin: warm and dry with no masses, lesions, or rashes Neuro: unable Psych: unable   Results for orders placed or performed during the hospital encounter of 2020-03-28 (from the past 48 hour(s))  CBG monitoring, ED     Status: Abnormal   Collection Time: Mar 28, 2020  1:03 AM  Result Value Ref Range   Glucose-Capillary 233 (H) 70 - 99 mg/dL     Comment: Glucose reference range applies only to samples taken after fasting for at least 8 hours.  Comprehensive metabolic panel     Status: Abnormal   Collection Time: 03/28/2020  1:04 AM  Result Value Ref Range   Sodium 139 135 - 145 mmol/L   Potassium 4.1 3.5 - 5.1 mmol/L   Chloride 106 98 - 111 mmol/L   CO2 15 (L) 22 - 32 mmol/L   Glucose, Bld 285 (H) 70 - 99 mg/dL    Comment: Glucose reference range applies only to samples taken after fasting for at least 8 hours.   BUN 27 (H) 8 - 23 mg/dL   Creatinine, Ser 1.99 (H) 0.61 - 1.24 mg/dL   Calcium 9.1 8.9 - 10.3 mg/dL   Total Protein 8.2 (H) 6.5 - 8.1 g/dL   Albumin 4.3 3.5 - 5.0 g/dL   AST 64 (H) 15 - 41 U/L   ALT 24 0 - 44 U/L   Alkaline Phosphatase 53 38 - 126 U/L   Total Bilirubin 1.2 0.3 - 1.2 mg/dL   GFR calc non Af Amer 35 (L) >60 mL/min   GFR calc Af Amer 41 (L) >60 mL/min   Anion gap 18 (H) 5 - 15    Comment: Performed at [facility], [address] [place], [place] [address]  Ethanol     Status: None   Collection Time: 03/28/20  1:04 AM  Result Value Ref Range   Alcohol, Ethyl (B) <10 <10 mg/dL    Comment: (NOTE) Lowest detectable limit for serum alcohol is 10 mg/dL.  For medical purposes only. Performed at [facility], [address], [place], [place] [address]   Lipase, blood     Status: None   Collection Time: 2020-03-28  1:04 AM  Result Value Ref Range   Lipase 39 11 - 51 U/L    Comment: Performed at [facility], [address], [place],  [address]  CBC with Differential     Status: Abnormal   Collection Time: 03/28/20  1:04 AM  Result Value Ref Range   WBC 20.5 (H) 4.0 - 10.5 K/uL   RBC 4.49 4.22 - 5.81 MIL/uL   Hemoglobin 14.6 13.0 - 17.0 g/dL   HCT 46.5 39 - 52 %   MCV 103.6 (H) 80.0 - 100.0 fL   MCH 32.5 26.0 - 34.0 pg   MCHC 31.4 30.0 - 36.0 g/dL   RDW 14.1 11.5 - 15.5 %   Platelets 232 150 - 400 K/uL   nRBC 0.0 0.0 - 0.2 %   Neutrophils Relative % 88 %  Neutro Abs 17.8 (H) 1.7 - 7.7 K/uL     Lymphocytes Relative 6 %   Lymphs Abs 1.3 0.7 - 4.0 K/uL   Monocytes Relative 6 %   Monocytes Absolute 1.3 (H) 0 - 1 K/uL   Eosinophils Relative 0 %   Eosinophils Absolute 0.0 0 - 0 K/uL   Basophils Relative 0 %   Basophils Absolute 0.0 0 - 0 K/uL   Immature Granulocytes 0 %   Abs Immature Granulocytes 0.09 (H) 0.00 - 0.07 K/uL    Comment: Performed at [facility], [address], [place], [place] [address]  Protime-INR     Status: Abnormal   Collection Time: 2020/03/21  1:04 AM  Result Value Ref Range   Prothrombin Time 16.0 (H) 11.4 - 15.2 seconds   INR 1.3 (H) 0.8 - 1.2    Comment: (NOTE) INR goal varies based on device and disease states. Performed at [facility], [address] [place], [place] [address]   APTT     Status: None   Collection Time: March 21, 2020  1:04 AM  Result Value Ref Range   aPTT 28 24 - 36 seconds    Comment: Performed at [facility], [address], [place], [place] [address]  Sample to Blood Bank     Status: None   Collection Time: Mar 21, 2020  1:04 AM  Result Value Ref Range   Blood Bank Specimen SAMPLE AVAILABLE FOR TESTING    Sample Expiration      03/15/2020,2359 Performed at [facility], [address], [place], [place] [address]   Lactic acid, plasma     Status: Abnormal   Collection Time: 21-Mar-2020  1:04 AM  Result Value Ref Range   Lactic Acid, Venous >11 (HH) 0.5 - 1.9 mmol/L    Comment: CRITICAL RESULT CALLED TO, READ BACK BY AND VERIFIED WITH: NORMAN,B @ 0152 ON 03-21-20 BY JUW Performed at [facility], [address] [place], [place] [address]   Troponin I (High Sensitivity)     Status: Abnormal   Collection Time: 21-Mar-2020  1:04 AM  Result Value Ref Range   Troponin I (High Sensitivity) 286 (HH) <18 ng/L    Comment: CRITICAL RESULT CALLED TO, READ BACK BY AND VERIFIED WITH: WATLINGTON,C @ 3474 ON March 21, 2020 BY JUW (NOTE) Elevated high sensitivity troponin I (hsTnI) values and significant  changes across serial measurements may suggest ACS  but many other  chronic and acute conditions are known to elevate hsTnI results.  Refer to the Links section for chest pain algorithms and additional  guidance. Performed at [facility], [address] [place], [place] [address]   POC occult blood, ED RN will collect     Status: Abnormal   Collection Time: 2020/03/21  1:36 AM  Result Value Ref Range   Fecal Occult Bld POSITIVE (A) NEGATIVE  SARS Coronavirus 2 by RT PCR (hospital order, performed in [facility] hospital lab) Nasopharyngeal Nasopharyngeal Swab     Status: None   Collection Time: 03-21-2020  3:06 AM   Specimen: Nasopharyngeal Swab  Result Value Ref Range   SARS Coronavirus 2 NEGATIVE NEGATIVE    Comment: (NOTE) SARS-CoV-2 target nucleic acids are NOT DETECTED.  The SARS-CoV-2 RNA is generally detectable in upper and lower respiratory specimens during the acute phase of infection. The lowest concentration of SARS-CoV-2 viral copies this assay can detect is 250 copies / mL. A negative result does not preclude SARS-CoV-2 infection and should not be used as the sole basis for treatment or  other patient management decisions.  A negative result may occur with improper specimen collection / handling, submission of specimen other than nasopharyngeal swab, presence of viral mutation(s) within the areas targeted by this assay, and inadequate number of viral copies (<250 copies / mL). A negative result must be combined with clinical observations, patient history, and epidemiological information.  Fact Sheet for Patients:   StrictlyIdeas.no  Fact Sheet for Healthcare Providers: BankingDealers.co.za  This test is not yet approved or  cleared by the [place] FDA and has been authorized for detection and/or diagnosis of SARS-CoV-2 by FDA under an Emergency Use Authorization (EUA).  This EUA will remain in effect (meaning this test can be used) for the duration of the COVID-19  declaration under Section 564(b)(1) of the Act, 21 U.S.C. section 360bbb-3(b)(1), unless the authorization is terminated or revoked sooner.  Performed at [facility], [address] [place], [place] [address]   Lactic acid, plasma     Status: Abnormal   Collection Time: Mar 29, 2020  3:21 AM  Result Value Ref Range   Lactic Acid, Venous 8.0 (HH) 0.5 - 1.9 mmol/L    Comment: CRITICAL RESULT CALLED TO, READ BACK BY AND VERIFIED WITH: WATLINGTON,C @ 4193 ON 29-Mar-2020 BY JUW Performed at [facility], [address] [place], [place] [address]   Troponin I (High Sensitivity)     Status: Abnormal   Collection Time: 29-Mar-2020  3:21 AM  Result Value Ref Range   Troponin I (High Sensitivity) 269 (HH) <18 ng/L    Comment: CRITICAL VALUE NOTED.  VALUE IS CONSISTENT WITH PREVIOUSLY REPORTED AND CALLED VALUE. (NOTE) Elevated high sensitivity troponin I (hsTnI) values and significant  changes across serial measurements may suggest ACS but many other  chronic and acute conditions are known to elevate hsTnI results.  Refer to the Links section for chest pain algorithms and additional  guidance. Performed at [facility], [address] [place], [place] [address]   Basic metabolic panel     Status: Abnormal   Collection Time: 03/29/20  6:05 AM  Result Value Ref Range   Sodium 139 135 - 145 mmol/L   Potassium 5.1 3.5 - 5.1 mmol/L    Comment: DELTA CHECK NOTED   Chloride 111 98 - 111 mmol/L   CO2 10 (L) 22 - 32 mmol/L   Glucose, Bld 232 (H) 70 - 99 mg/dL    Comment: Glucose reference range applies only to samples taken after fasting for at least 8 hours.   BUN 32 (H) 8 - 23 mg/dL   Creatinine, Ser 2.30 (H) 0.61 - 1.24 mg/dL   Calcium 7.9 (L) 8.9 - 10.3 mg/dL   GFR calc non Af Amer 29 (L) >60 mL/min   GFR calc Af Amer 34 (L) >60 mL/min   Anion gap 18 (H) 5 - 15    Comment: Performed at [facility], [address], [place], [place] [address]  CBC     Status: Abnormal   Collection Time: 03-29-2020   6:05 AM  Result Value Ref Range   WBC 12.7 (H) 4.0 - 10.5 K/uL   RBC 3.58 (L) 4.22 - 5.81 MIL/uL   Hemoglobin 11.5 (L) 13.0 - 17.0 g/dL   HCT 37.1 (L) 39 - 52 %   MCV 103.6 (H) 80.0 - 100.0 fL   MCH 32.1 26.0 - 34.0 pg   MCHC 31.0 30.0 - 36.0 g/dL   RDW 14.0 11.5 - 15.5 %   Platelets 195 150 - 400 K/uL  nRBC 0.0 0.0 - 0.2 %    Comment: Performed at [facility], [address] [place], [place] [address]  I-stat chem 8, ed     Status: Abnormal   Collection Time: 2020/04/09  9:53 AM  Result Value Ref Range   Sodium 150 (H) 135 - 145 mmol/L   Potassium 3.9 3.5 - 5.1 mmol/L   Chloride 118 (H) 98 - 111 mmol/L   BUN 24 (H) 8 - 23 mg/dL   Creatinine, Ser 2.00 (H) 0.61 - 1.24 mg/dL   Glucose, Bld 127 (H) 70 - 99 mg/dL    Comment: Glucose reference range applies only to samples taken after fasting for at least 8 hours.   Calcium, Ion 0.85 (LL) 1.15 - 1.40 mmol/L   TCO2 15 (L) 22 - 32 mmol/L   Hemoglobin 9.2 (L) 13.0 - 17.0 g/dL   HCT 27.0 (L) 39 - 52 %  Type and screen     Status: None (Preliminary result)   Collection Time: 04/09/2020  9:55 AM  Result Value Ref Range   ABO/RH(D) PENDING    Antibody Screen PENDING    Sample Expiration      03/17/2020,2359 Performed at [facility] [address], [place], [place] [address]    Unit Number [id number]    Blood Component Type RED CELLS,LR    Unit division 00    Status of Unit ISSUED    Unit tag comment EMERGENCY RELEASE    Transfusion Status OK TO TRANSFUSE    Crossmatch Result PENDING    Unit Number [id number]    Blood Component Type RED CELLS,LR    Unit division 00    Status of Unit ISSUED    Transfusion Status OK TO TRANSFUSE    Crossmatch Result PENDING   I-Stat arterial blood gas, ED     Status: Abnormal   Collection Time: April 09, 2020 10:27 AM  Result Value Ref Range   pH, Arterial 6.859 (LL) 7.35 - 7.45   pCO2 arterial 79.5 (HH) 32 - 48 mmHg   pO2, Arterial 329 (H) 83 - 108 mmHg   Bicarbonate 14.2 (L) 20.0 -  28.0 mmol/L   TCO2 17 (L) 22 - 32 mmol/L   O2 Saturation 100.0 %   Acid-base deficit 20.0 (H) 0.0 - 2.0 mmol/L   Sodium 144 135 - 145 mmol/L   Potassium 5.4 (H) 3.5 - 5.1 mmol/L   Calcium, Ion 0.94 (L) 1.15 - 1.40 mmol/L   HCT 33.0 (L) 39 - 52 %   Hemoglobin 11.2 (L) 13.0 - 17.0 g/dL   Collection site Radial    Drawn by MD    Sample type ARTERIAL    CT Abdomen Pelvis Wo Contrast  Result Date: 2020/04/09 CLINICAL DATA:  Abdominal distension and pain. Episode of bright red bloody emesis. EXAM: CT ABDOMEN AND PELVIS WITHOUT CONTRAST TECHNIQUE: Multidetector CT imaging of the abdomen and pelvis was performed following the standard protocol without IV contrast. COMPARISON:  05/07/2008 FINDINGS: Lower chest: Lung bases demonstrate interstitial thickening with areas of peripheral reticulation at have increased when compared to the prior chest radiograph, consistent with progression of fibrosis. No convincing pneumonia or pulmonary edema. No pleural effusion pneumothorax. Heart is normal in size. Hepatobiliary: Liver normal in size and attenuation. No mass or focal lesion. Gallbladder not visualized. No bile duct dilation. Pancreas: No pancreatic mass, inflammation or duct dilation. Spleen: Spleen is small, significantly decreased in size from the prior CT. No splenic mass focal lesion. Adrenals/Urinary Tract: No adrenal  mass. Small kidneys with thin cortices. Subcentimeter low-density lesion from the superior margin of the right kidney, consistent with a cyst. No other masses. No stones or hydronephrosis. Ureters grossly normal in course and in caliber. Bladder is unremarkable. Stomach/Bowel: There are changes from a previous gastric bypass. The distal esophagus is significantly distended. Material within the distal esophagus has average Hounsfield units of 55, which could reflect hemorrhage. Stomach empties into a gastrojejunostomy. There is a small bowel anastomosis in right mid abdomen. Small bowel and  colon shows mild diffuse distention with diffuse air-fluid levels. No transition point. No wall thickening or adjacent inflammation. No evidence of appendicitis. There is portal venous air along the central small bowel mesentery. Subtle evidence of pneumatosis intestinalis of distal small bowel is noted in the lower abdomen and pelvis. Vascular/Lymphatic: Extensive aortic and branch vessel atherosclerotic calcifications. No enlarged lymph nodes. Reproductive: Unremarkable. Other: Trace amount of ascites seen along the anterolateral margin of the liver. Small midline upper abdominal wall hernia contains a bubble of air. There are few other small bubbles of apparent extraluminal air consistent with minimal pneumoperitoneum. Musculoskeletal: No fracture or acute finding. No osteoblastic or osteolytic lesions. Significant degenerative changes. Bilateral pars defects with a grade 1 to grade 2 anterolisthesis of L3 on L4. IMPRESSION: 1. Findings are suspicious for small bowel ischemia. There is subtle evidence of pneumatosis intestinalis of the distal small bowel with more definitive portal venous air extending along the small bowel mesentery. There are a few bubbles of extraluminal air consistent with minimal pneumoperitoneum. In addition to these findings, there is significant distension of the lower esophagus containing material of increased Hounsfield units consistent with hemorrhage. Small bowel and colon are mildly distended diffusely with air-fluid level consistent with a diffuse adynamic ileus. There is significant vascular disease reflected by diffuse atherosclerotic calcifications along the aorta and its branch vessels, which has progressed compared to the prior CT. Trace amount of ascites. Critical Value/emergent results were called by telephone at the time of interpretation on 03/26/2020 at 4:57 am to provider IVA KNAPP , who verbally acknowledged these results. 2. No other convincing acute abnormality. 3. Lung  base changes consistent with parenchymal fibrosis, progressed since the prior CT. 4. Small kidneys consistent with medical renal disease. Electronically Signed   By: Lajean Manes M.D.   On: 2020-03-26 04:57   DG Chest Portable 1 View  Result Date: 26-Mar-2020 CLINICAL DATA:  Intubation and nasogastric tube placement. EXAM: PORTABLE CHEST 1 VIEW 8:31 a.m.: COMPARISON:  Portable chest x-ray earlier same day at 7:13 a.m. in previously. FINDINGS: Endotracheal tube tip in satisfactory position projecting approximately 5 cm above the carina. Nasogastric tube courses below the diaphragm with its tip projected over the fundus of the stomach. External pacing pads are now present. No acute findings involving the lungs which are unchanged in appearance since the examination earlier same day. IMPRESSION: 1. Endotracheal tube tip in satisfactory position projecting approximately 5 cm above the carina. 2. Nasogastric tube courses below the diaphragm with its tip projected over the fundus of the stomach. 3.  No acute cardiopulmonary disease. Electronically Signed   By: Evangeline Dakin M.D.   On: 26-Mar-2020 09:29   DG Chest Portable 1 View  Result Date: 03/26/2020 CLINICAL DATA:  Bedside central venous catheter placement. Hematemesis. EXAM: PORTABLE CHEST 1 VIEW COMPARISON:  09/28/2016 and earlier. FINDINGS: LEFT jugular central venous catheter tip projects at or near the cavoatrial junction. No evidence of pneumothorax or mediastinal hematoma. Sternotomy for CABG. Cardiac  silhouette mildly enlarged, unchanged. Prominent bronchovascular markings diffusely and central peribronchial thickening, unchanged. Lungs otherwise clear. Pulmonary vascularity normal. No visible pleural effusions. IMPRESSION: 1. LEFT jugular central venous catheter tip projects at or near the cavoatrial junction. No acute complicating features. 2. Stable COPD/emphysema. No acute cardiopulmonary disease. Electronically Signed   By: Evangeline Dakin M.D.    On: Mar 16, 2020 08:38      Assessment/Plan Septic shock secondary to ischemic bowel, s/p roux en y gastric bypass The patient is critically ill secondary to ischemic bowel likely secondary to internal hernia from bypass surgery in 2005.  Unfortunately, he is so ill at this point with a ph of 6.8, etc, that he likely will not recover whether we operate or not.  A long conversation has been had with the daughter of the patient explaining the situation and that we can proceed with an operation but we are very concerned he may not make it through the operation or that he will have a non-correctable problem with full bowel ischemia.  We discussed keeping him comfortable and letting him pass without surgery as well.  Ultimately, she desired to keep him comfortable and not pursue an operation.  Dr. Donne Hazel spoke to the daughter as well and rediscussed these things too.  We then discussed this with Dr. Lamonte Sakai who agreed.  This is a late entry note.  Since this time the patient has succumb to his illness and passed.   Henreitta Cea, [facility] Surgery 03-16-2020, 1:08 PM Please see Amion for pager number during day hours 7:00am-4:30pm or 7:00am -11:30am on weekends

## 2020-03-15 NOTE — ED Notes (Signed)
Pt gave DR. Bridges permission to give information to his friend Vevelyn Francois as needed [phone] or [phone].  Add to contacts.

## 2020-03-15 NOTE — ED Notes (Signed)
[facility] to transport.

## 2020-03-15 NOTE — ED Notes (Signed)
Date and time results received: 04-08-20 0356  Test: Lactic Acid Critical Value: 8.0  Name of Provider Notified: Rolland Porter, MD  Orders Received? Or Actions Taken?: n/a

## 2020-03-15 NOTE — ED Notes (Signed)
Pt arrives via [facility] pale, tubed and unresponsive. A-Line reading pressure 30/20's. Fluids and 2U emergent PRBC's infusing. Triple lumen central line present, also has 20G present to RFA. Levo gtt going at 50/hr during transfer. No pulses present during initial assessment, 0.1Epi push given, and pulses regained, BP started to respond.

## 2020-03-15 NOTE — ED Notes (Signed)
Color change noted during intubation

## 2020-03-15 NOTE — ED Provider Notes (Signed)
[facility] EMERGENCY DEPARTMENT Provider Note   CSN: [id number] Arrival date & time: 04/09/20  9935   Time seen 1:18 AM  History Chief Complaint  Patient presents with  . Abdominal Pain    [name] is a 64 y.o. male.  HPI   Patient states Sunday, July 23 he started having a lot of abdominal gas and some abdominal pain that was periumbilical and now is down in his groin.  He states he had diarrhea yesterday and the day before and had about 3 episodes per day that he describes as being green.  About 11 AM today, July 30 he started vomiting and states she has prior to EMS arrival he had a small about of hematemesis and then he had another episode when he arrived in the ED.  He denies cough or shortness of breath.  He denies any alcohol use.  He states he is diabetic however he is on no medications, he states that his diet controlled.  He states he went to [facility] ED about 2 PM today and had a CT scan and was told he was fine.  Patient is requesting morphine which she got at [facility] ED.  PCP Westley Chandler, NP in [place], [place]   Past Medical History:  Diagnosis Date  . Anemia   . Arthritis   . CAD (coronary artery disease)   . Diabetes mellitus   . Hyperlipidemia   . Hypertension   . Left ventricular dysfunction   . Low back pain   . MI (myocardial infarction) ([place])   . Obesity   . PE (pulmonary embolism)   . Stroke ([place])   . Tobacco abuse   . Tubular adenoma of colon 05/2015    Patient Active Problem List   Diagnosis Date Noted  . Small bowel ischemia ([place])   . Lactic acidosis   . Elevated troponin   . Metabolic acidosis   . Severe sepsis with septic shock ([place])   . Acute renal failure ([place])   . Bilateral knee pain 04/24/2019  . Type 2 diabetes mellitus with other circulatory complications ([place]) 70/17/7939  . Opioid type dependence, continuous ([place]) 07/20/2017  . History of pulmonary embolism 04/20/2017  . Hx of CABG 04/20/2017  . GERD (gastroesophageal reflux  disease) 01/03/2017  . Left carotid stenosis 09/30/2016  . Recurrent pulmonary emboli ([place]) 09/30/2016  . Deep vein thrombosis (DVT) of popliteal vein of left lower extremity ([place]) 09/30/2016  . CAD (coronary artery disease) 09/30/2016  . Tetrahydrocannabinol (THC) use disorder, mild, abuse 09/30/2016  . Cerebrovascular accident (CVA) due to embolism of right middle cerebral artery ([name]) - s/ IV tPA and mechanical thrombectomy   . CVA (cerebral vascular accident) ([place]) 09/27/2016  . Generalized anxiety disorder 02/09/2016  . Constipation 01/08/2015  . Other nonspecific abnormal finding of lung field 09/02/2013  . Chronic pain syndrome 09/02/2013  . Vitamin D deficiency 09/03/2009  . CAD, ARTERY BYPASS GRAFT 05/20/2009  . Diabetes mellitus type 2, diet-controlled ([place]) 12/11/2006  . Hyperlipidemia 12/11/2006  . Iron deficiency anemia 12/11/2006  . TOBACCO ABUSE 12/11/2006  . Essential hypertension 12/11/2006  . MYOCARDIAL INFARCTION, HX OF 12/11/2006  . CARDIOMYOPATHY, ISCHEMIC 12/11/2006  . PULMONARY EMBOLISM, HX OF 12/11/2006    Past Surgical History:  Procedure Laterality Date  . CHOLECYSTECTOMY    . CORONARY ARTERY BYPASS GRAFT    . CORONARY STENT PLACEMENT     3 all in right side  . GASTRIC BYPASS    . HERNIA REPAIR    .  IR GENERIC HISTORICAL  09/27/2016   IR ANGIO INTRA EXTRACRAN SEL COM CAROTID INNOMINATE UNI L MOD SED 09/27/2016 Luanne Bras, MD [facility]  . IR GENERIC HISTORICAL  09/27/2016   IR PERCUTANEOUS ART THROMBECTOMY/INFUSION INTRACRANIAL INC DIAG ANGIO 09/27/2016 Luanne Bras, MD [facility]  . IR GENERIC HISTORICAL  11/07/2016   IR RADIOLOGIST EVAL & MGMT 11/07/2016 [facility]  . Neck Fusion    . RADIOLOGY WITH ANESTHESIA N/A 09/27/2016   Procedure: RADIOLOGY WITH ANESTHESIA;  Surgeon: Luanne Bras, MD;  Location: [facility];  Service: Radiology;  Laterality: N/A;  . TONSILLECTOMY         Family History  Problem Relation Age of Onset  .  Emphysema Mother   . Heart attack Father   . Heart disease Daughter   . Heart disease Brother   . Arthritis Brother   . Hemophilia Brother   . Colon cancer Neg Hx     Social History   Tobacco Use  . Smoking status: Current Every Day Smoker    Packs/day: 1.00    Years: 40.00    Pack years: 40.00    Types: Cigarettes  . Smokeless tobacco: Former Network engineer  . Vaping Use: Never used  Substance Use Topics  . Alcohol use: No  . Drug use: Yes    Types: Marijuana    Home Medications Prior to Admission medications   Medication Sig Start Date End Date Taking? Authorizing Provider  ALPRAZolam Duanne Moron) 1 MG tablet TAKE 1 TABLET THREE TIMES DAILY AS NEEDED FOR ANXIETY 09/10/19   Lucille Passy, MD  apixaban (ELIQUIS) 5 MG TABS tablet Take 1 tablet (5 mg total) by mouth 2 (two) times daily. 05/13/19   Lucille Passy, MD  aspirin 81 MG tablet Take 81 mg by mouth daily.      [provider]  Calcium Citrate-Vitamin D 500-400 MG-UNIT CHEW Chew 1 tablet by mouth 2 (two) times daily.      [provider]  Cholecalciferol (VITAMIN D) 2000 UNITS tablet Take 5,000 Units by mouth daily.     [provider]  cyanocobalamin 500 MCG tablet Take by mouth daily.     [provider]  ferrous sulfate 220 (44 Fe) MG/5ML solution TAKE 6.8MLS (300MG  TOTAL) BY MOUTH ONCE DAILY. 08/26/19   Lucille Passy, MD  fexofenadine (ALLEGRA) 180 MG tablet Take 180 mg by mouth daily as needed. 12/27/11   Lucille Passy, MD  HYDROcodone-acetaminophen [facility]) 10-325 MG tablet Take 1 tablet by mouth every 4 (four) hours as needed for moderate pain. 09/10/19   Lucille Passy, MD  HYDROcodone-acetaminophen [facility]) 10-325 MG tablet Take 1 tablet by mouth every 4 (four) hours as needed for moderate pain. 12/19/19   Lesleigh Noe, MD  Multiple Vitamin (MULTIVITAMIN) tablet Take 1 tablet by mouth daily.      [provider]  nitroGLYCERIN (NITROSTAT) 0.3 MG SL tablet Place 1 tablet (0.3 mg  total) under the tongue every 5 (five) minutes as needed. 08/20/18   Lucille Passy, MD  omeprazole (PRILOSEC) 20 MG capsule Take 20 mg by mouth daily.    [provider]  simvastatin (ZOCOR) 80 MG tablet TAKE ONE TABLET BY MOUTH AT BEDTIME. 06/25/19   Lucille Passy, MD    Allergies    Codeine  Review of Systems   Review of Systems  All other systems reviewed and are negative.   Physical Exam Updated Vital Signs BP (!) 63/39  Pulse 49   Temp (!) 96.5 F (35.8 C) (Rectal)   Resp (!) 45   Ht 6' (1.829 m)   Wt 76.2 kg   SpO2 (!) 84%   BMI 22.78 kg/m   Physical Exam Vitals and nursing note reviewed.  Constitutional:      Appearance: He is normal weight. He is ill-appearing and toxic-appearing.     Comments: Appears older than his stated age, patient having chills when I enter the room  HENT:     Head: Normocephalic and atraumatic.     Right Ear: External ear normal.     Left Ear: External ear normal.     Mouth/Throat:     Mouth: Mucous membranes are dry.  Eyes:     Extraocular Movements: Extraocular movements intact.     Conjunctiva/sclera: Conjunctivae normal.     Pupils: Pupils are equal, round, and reactive to light.     Comments: Conjunctiva are pale since blood pressure  Cardiovascular:     Rate and Rhythm: Regular rhythm. Tachycardia present.  Pulmonary:     Effort: Pulmonary effort is normal. No respiratory distress.     Breath sounds: Normal breath sounds. No wheezing or rales.  Abdominal:     General: Abdomen is flat. There is distension.     Palpations: Abdomen is soft.     Tenderness: There is abdominal tenderness.     Comments: Patient is very tender in his lower abdomen and in his groin.  Musculoskeletal:     Cervical back: Normal range of motion.  Skin:    Comments: Patient is very pale and cool to touch  Neurological:     General: No focal deficit present.     Mental Status: He is alert and oriented to person, place, and time.     Cranial  Nerves: No cranial nerve deficit.  Psychiatric:        Mood and Affect: Affect is flat.        Speech: Speech is delayed.        Behavior: Behavior is slowed.     ED Results / Procedures / Treatments   Labs (all labs ordered are listed, but only abnormal results are displayed) Results for orders placed or performed during the hospital encounter of 2020/03/20  SARS Coronavirus 2 by RT PCR (hospital order, performed in [place]  hospital lab) Nasopharyngeal Nasopharyngeal Swab   Specimen: Nasopharyngeal Swab  Result Value Ref Range   SARS Coronavirus 2 NEGATIVE NEGATIVE  Comprehensive metabolic panel  Result Value Ref Range   Sodium 139 135 - 145 mmol/L   Potassium 4.1 3.5 - 5.1 mmol/L   Chloride 106 98 - 111 mmol/L   CO2 15 (L) 22 - 32 mmol/L   Glucose, Bld 285 (H) 70 - 99 mg/dL   BUN 27 (H) 8 - 23 mg/dL   Creatinine, Ser 1.99 (H) 0.61 - 1.24 mg/dL   Calcium 9.1 8.9 - 10.3 mg/dL   Total Protein 8.2 (H) 6.5 - 8.1 g/dL   Albumin 4.3 3.5 - 5.0 g/dL   AST 64 (H) 15 - 41 U/L   ALT 24 0 - 44 U/L   Alkaline Phosphatase 53 38 - 126 U/L   Total Bilirubin 1.2 0.3 - 1.2 mg/dL   GFR calc non Af Amer 35 (L) >60 mL/min   GFR calc Af Amer 41 (L) >60 mL/min   Anion gap 18 (H) 5 - 15  Ethanol  Result Value Ref Range   Alcohol, Ethyl (B) <10 <  10 mg/dL  Lipase, blood  Result Value Ref Range   Lipase 39 11 - 51 U/L  CBC with Differential  Result Value Ref Range   WBC 20.5 (H) 4.0 - 10.5 K/uL   RBC 4.49 4.22 - 5.81 MIL/uL   Hemoglobin 14.6 13.0 - 17.0 g/dL   HCT 46.5 39 - 52 %   MCV 103.6 (H) 80.0 - 100.0 fL   MCH 32.5 26.0 - 34.0 pg   MCHC 31.4 30.0 - 36.0 g/dL   RDW 14.1 11.5 - 15.5 %   Platelets 232 150 - 400 K/uL   nRBC 0.0 0.0 - 0.2 %   Neutrophils Relative % 88 %   Neutro Abs 17.8 (H) 1.7 - 7.7 K/uL   Lymphocytes Relative 6 %   Lymphs Abs 1.3 0.7 - 4.0 K/uL   Monocytes Relative 6 %   Monocytes Absolute 1.3 (H) 0 - 1 K/uL   Eosinophils Relative 0 %   Eosinophils Absolute  0.0 0 - 0 K/uL   Basophils Relative 0 %   Basophils Absolute 0.0 0 - 0 K/uL   Immature Granulocytes 0 %   Abs Immature Granulocytes 0.09 (H) 0.00 - 0.07 K/uL  Protime-INR  Result Value Ref Range   Prothrombin Time 16.0 (H) 11.4 - 15.2 seconds   INR 1.3 (H) 0.8 - 1.2  APTT  Result Value Ref Range   aPTT 28 24 - 36 seconds  Lactic acid, plasma  Result Value Ref Range   Lactic Acid, Venous >11 (HH) 0.5 - 1.9 mmol/L  Lactic acid, plasma  Result Value Ref Range   Lactic Acid, Venous 8.0 (HH) 0.5 - 1.9 mmol/L  Basic metabolic panel  Result Value Ref Range   Sodium 139 135 - 145 mmol/L   Potassium 5.1 3.5 - 5.1 mmol/L   Chloride 111 98 - 111 mmol/L   CO2 10 (L) 22 - 32 mmol/L   Glucose, Bld 232 (H) 70 - 99 mg/dL   BUN 32 (H) 8 - 23 mg/dL   Creatinine, Ser 2.30 (H) 0.61 - 1.24 mg/dL   Calcium 7.9 (L) 8.9 - 10.3 mg/dL   GFR calc non Af Amer 29 (L) >60 mL/min   GFR calc Af Amer 34 (L) >60 mL/min   Anion gap 18 (H) 5 - 15  CBC  Result Value Ref Range   WBC 12.7 (H) 4.0 - 10.5 K/uL   RBC 3.58 (L) 4.22 - 5.81 MIL/uL   Hemoglobin 11.5 (L) 13.0 - 17.0 g/dL   HCT 37.1 (L) 39 - 52 %   MCV 103.6 (H) 80.0 - 100.0 fL   MCH 32.1 26.0 - 34.0 pg   MCHC 31.0 30.0 - 36.0 g/dL   RDW 14.0 11.5 - 15.5 %   Platelets 195 150 - 400 K/uL   nRBC 0.0 0.0 - 0.2 %  POC occult blood, ED RN will collect  Result Value Ref Range   Fecal Occult Bld POSITIVE (A) NEGATIVE  CBG monitoring, ED  Result Value Ref Range   Glucose-Capillary 233 (H) 70 - 99 mg/dL  Sample to Blood Bank  Result Value Ref Range   Blood Bank Specimen SAMPLE AVAILABLE FOR TESTING    Sample Expiration      03/15/2020,2359 Performed at [facility], [address] [place], [place] [address]   Troponin I (High Sensitivity)  Result Value Ref Range   Troponin I (High Sensitivity) 286 (HH) <18 ng/L  Troponin I (High Sensitivity)  Result Value Ref Range  Troponin I (High Sensitivity) 269 (HH) <18 ng/L   Laboratory interpretation  all normal except lactic acidosis, initial troponin very high, Hemoccult positive for blood, elevated anion gap, new renal insufficiency, elevation of 1 LFT, leukocytosis, delta troponin although positive or elevated decreased on the second level.Malnutrition    EKG EKG Interpretation  Date/Time:  2020-04-02 00:58:31 EDT Ventricular Rate:  97 PR Interval:    QRS Duration: 106 QT Interval:  343 QTC Calculation: 436 R Axis:   84 Text Interpretation: Sinus rhythm Biatrial enlargement Borderline right axis deviation Abnormal T, consider ischemia, diffuse leads Since last tracing rate faster 30 Sep 2019 Confirmed by Rolland Porter [phone]) on 04/02/20 1:15:09 AM   Radiology CT Abdomen Pelvis Wo Contrast  Result Date: 02-Apr-2020 CLINICAL DATA:  Abdominal distension and pain. Episode of bright red bloody emesis. EXAM: CT ABDOMEN AND PELVIS WITHOUT CONTRAST TECHNIQUE: Multidetector CT imaging of the abdomen and pelvis was performed following the standard protocol without IV contrast. COMPARISON:  05/07/2008 FINDINGS: Lower chest: Lung bases demonstrate interstitial thickening with areas of peripheral reticulation at have increased when compared to the prior chest radiograph, consistent with progression of fibrosis. No convincing pneumonia or pulmonary edema. No pleural effusion pneumothorax. Heart is normal in size. Hepatobiliary: Liver normal in size and attenuation. No mass or focal lesion. Gallbladder not visualized. No bile duct dilation. Pancreas: No pancreatic mass, inflammation or duct dilation. Spleen: Spleen is small, significantly decreased in size from the prior CT. No splenic mass focal lesion. Adrenals/Urinary Tract: No adrenal mass. Small kidneys with thin cortices. Subcentimeter low-density lesion from the superior margin of the right kidney, consistent with a cyst. No other masses. No stones or hydronephrosis. Ureters grossly normal in course and in caliber. Bladder is  unremarkable. Stomach/Bowel: There are changes from a previous gastric bypass. The distal esophagus is significantly distended. Material within the distal esophagus has average Hounsfield units of 55, which could reflect hemorrhage. Stomach empties into a gastrojejunostomy. There is a small bowel anastomosis in right mid abdomen. Small bowel and colon shows mild diffuse distention with diffuse air-fluid levels. No transition point. No wall thickening or adjacent inflammation. No evidence of appendicitis. There is portal venous air along the central small bowel mesentery. Subtle evidence of pneumatosis intestinalis of distal small bowel is noted in the lower abdomen and pelvis. Vascular/Lymphatic: Extensive aortic and branch vessel atherosclerotic calcifications. No enlarged lymph nodes. Reproductive: Unremarkable. Other: Trace amount of ascites seen along the anterolateral margin of the liver. Small midline upper abdominal wall hernia contains a bubble of air. There are few other small bubbles of apparent extraluminal air consistent with minimal pneumoperitoneum. Musculoskeletal: No fracture or acute finding. No osteoblastic or osteolytic lesions. Significant degenerative changes. Bilateral pars defects with a grade 1 to grade 2 anterolisthesis of L3 on L4. IMPRESSION: 1. Findings are suspicious for small bowel ischemia. There is subtle evidence of pneumatosis intestinalis of the distal small bowel with more definitive portal venous air extending along the small bowel mesentery. There are a few bubbles of extraluminal air consistent with minimal pneumoperitoneum. In addition to these findings, there is significant distension of the lower esophagus containing material of increased Hounsfield units consistent with hemorrhage. Small bowel and colon are mildly distended diffusely with air-fluid level consistent with a diffuse adynamic ileus. There is significant vascular disease reflected by diffuse atherosclerotic  calcifications along the aorta and its branch vessels, which has progressed compared to the prior CT. Trace amount of ascites. Critical Value/emergent results  were called by telephone at the time of interpretation on March 27, 2020 at 4:57 am to provider Dani Wallner , who verbally acknowledged these results. 2. No other convincing acute abnormality. 3. Lung base changes consistent with parenchymal fibrosis, progressed since the prior CT. 4. Small kidneys consistent with medical renal disease. Electronically Signed   By: Lajean Manes M.D.   On: 03-27-20 04:57    Procedures .Critical Care Performed by: Rolland Porter, MD Authorized by: Rolland Porter, MD   Critical care provider statement:    Critical care time (minutes):  45   Critical care was necessary to treat or prevent imminent or life-threatening deterioration of the following conditions:  Circulatory failure   Critical care was time spent personally by me on the following activities:  Discussions with consultants, examination of patient, obtaining history from patient or surrogate, ordering and review of laboratory studies, ordering and review of radiographic studies, pulse oximetry and re-evaluation of patient's condition   (including critical care time)                             Medications Ordered in ED Medications  pantoprazole (PROTONIX) 80 mg in sodium chloride 0.9 % 100 mL (0.8 mg/mL) infusion (8 mg/hr Intravenous New Bag/Given Mar 27, 2020 0222)  pantoprazole (PROTONIX) injection 40 mg (has no administration in time range)  0.9 %  sodium chloride infusion ( Intravenous Stopped 03-27-2020 0554)  iohexol (OMNIPAQUE) 9 MG/ML oral solution ( Oral Canceled Entry 03/27/20 0304)  norepinephrine (LEVOPHED) 4mg  in 282mL premix infusion (2 mcg/min Intravenous New Bag/Given Mar 27, 2020 0656)  EPINEPHrine (ADRENALIN) 4 mg in NS 250 mL (0.016 mg/mL) premix infusion (has no administration in time range)  sodium chloride 0.9 % bolus 1,000  mL (0 mLs Intravenous Stopped 03-27-20 0222)  sodium chloride 0.9 % bolus 500 mL (0 mLs Intravenous Stopped 03/27/20 0159)  pantoprazole (PROTONIX) 80 mg in sodium chloride 0.9 % 100 mL IVPB (0 mg Intravenous Stopped March 27, 2020 0222)  morphine 4 MG/ML injection 4 mg (4 mg Intravenous Given 03/27/20 0245)  sodium chloride 0.9 % bolus 1,000 mL (0 mLs Intravenous Stopped March 27, 2020 0554)  lactated ringers bolus 1,000 mL (0 mLs Intravenous Stopped 2020/03/27 0658)  piperacillin-tazobactam (ZOSYN) IVPB 3.375 g (0 g Intravenous Stopped 27-Mar-2020 4034)    ED Course  I have reviewed the triage vital signs and the nursing notes.  Pertinent labs & imaging results that were available during my care of the patient were reviewed by me and considered in my medical decision making (see chart for details).    MDM Rules/Calculators/A&P                           Patient was given IV fluid bolus and he was started on a Protonix bolus and infusion for his upper GI bleeding..  Patient is requesting morphine which he got today in [place].  He takes hydrocodone 10/325 and states he takes 5 a day.  Patient was hypothermic and was placed on a warming blanket.  At this point it is not clear whether he is septic.    Review of the [facility] shows patient does get 150 hydrocodone 10/325 monthly last filled July 9, and #90 alprazolam 1 mg tablets last filled July 9.  4:00 AM I was informed patient have blood pressure 78/62.  He was given another liter lactated Ringer's.  His blood pressure improved to 97/75 at 5:00 AM.  4:56 AM radiologist called his CT results which showed ischemia of the small bowel.  05:23 AM Dr Constance Haw is coming in to help stabilize patient, but wants him transferred to [facility] b/o needing GI and cardiology. Wants me to let [facility] know about patient, I will talk to PCCM since he will be going to the ICU.  She also recommends giving him Zosyn.  5:41 PM Dr. Oletta Darter, pulmonary critical care wants surgery  to decide if they will take the patient to the OR or not and decide if they were going to admit him.  Dr. Bobbye Morton, [facility] surgery discussed patient from 6:12-6: Yes hi this is Dr. Tomi Bamberger again I am sorry you had to hang up with Dr. Darene Lamer glycogen connect me to him again 25 AM.  Dr. Constance Haw arrived in the ED at 6:20 AM and also talk to Dr. Bobbye Morton.  6:25 AM Dr. Ferne Reus me to speak to pulmonary critical care again about admitting the patient.  Dr. Constance Haw is getting patient ready for central line placement.  Patient discussed with Dr. Lamonte Sakai, PCCM from 7:45-7:52 AM.  He states he would accept the patient to their ICU however bed control has called me back and there are no ICU beds.   Patient was maintaining his oxygenation with nasal cannula however around 7 AM he started getting pulse ox is in the high 80s, he was placed on a nonrebreather mask.  The last time I checked about 745 his oxygen is 88% with a nonrebreather.  I have asked Dr. Sedonia Small, to intubate patient for me since we are at change of shift 1.  07:55 AM Dr Sherry Ruffing, [facility], made aware of patient and need for transfer  7:57 AM currently called me back and they have a truck available which they were going to send.  Final Clinical Impression(s) / ED Diagnoses Final diagnoses:  Generalized abdominal pain  Hematemesis with nausea  Lactic acidosis  Small bowel ischemia ([name])  Elevated troponin  Renal insufficiency  Metabolic acidosis  Hypotension due to hypovolemia  Acute respiratory failure with hypoxia ([place])    Rx / DC Orders  Transfer to [facility] ED   Rolland Porter, MD, Barbette Or, MD April 09, 2020 [phone]

## 2020-03-15 NOTE — ED Provider Notes (Signed)
  Provider Note MRN:  [id number]  Arrival date & time: 03/17/20    ED Course and Medical Decision Making  Assumed care from Dr. Tomi Bamberger at shift change.  Small bowel ischemia, critically ill with profound hypotension, profound acidosis, becoming more hypoxic, tachypneic.  Intubated as described below, to be transferred to [facility] for OR intervention.  .Critical Care Performed by: Maudie Flakes, MD Authorized by: Maudie Flakes, MD   Critical care provider statement:    Critical care time (minutes):  45   Critical care was necessary to treat or prevent imminent or life-threatening deterioration of the following conditions: Small bowel ischemia, shock, hypoxic respiratory failure, critical acidosis.   Critical care was time spent personally by me on the following activities:  Discussions with consultants, evaluation of patient's response to treatment, examination of patient, ordering and performing treatments and interventions, ordering and review of laboratory studies, ordering and review of radiographic studies, pulse oximetry, re-evaluation of patient's condition, obtaining history from patient or surrogate and review of old charts   I assumed direction of critical care for this patient from another provider in my specialty: yes   Procedure Name: Intubation Date/Time: 03-17-20 8:40 AM Performed by: Maudie Flakes, MD Pre-anesthesia Checklist: Patient identified, Patient being monitored, Emergency Drugs available, Timeout performed and Suction available Oxygen Delivery Method: Non-rebreather mask Preoxygenation: Pre-oxygenation with 100% oxygen Induction Type: Rapid sequence Ventilation: Mask ventilation without difficulty Laryngoscope Size: Glidescope and 4 Grade View: Grade I Tube size: 7.5 mm Number of attempts: 1 Airway Equipment and Method: Rigid stylet Placement Confirmation: ETT inserted through vocal cords under direct vision,  CO2 detector and Breath sounds checked- equal  and bilateral Secured at: 24 cm Tube secured with: ETT holder Difficulty Due To: Difficulty was anticipated Comments: High risk for periintubation arrest given patient's critical illness.  Zoll pads placed prior to intubation attempt.  Patient's large beard shaved prior to intubation attempt.  In response to patient's profound acidosis, 2 A bicarb given prior to intubation attempt.  Push dose epinephrine was available at the bedside, patient with blood pressure 485 systolic on norepinephrine drip.  20 mg etomidate and 80 mg rocuronium for RSI meds.  Bloody airway requiring suction, tube placed successfully.       Final Clinical Impressions(s) / ED Diagnoses     ICD-10-CM   1. Generalized abdominal pain  R10.84   2. Hematemesis with nausea  K92.0   3. Lactic acidosis  E87.2   4. Small bowel ischemia ([name])  K55.9   5. Elevated troponin  R77.8   6. Renal insufficiency  N28.9   7. Metabolic acidosis  I62.7   8. Hypotension due to hypovolemia  I95.89    E86.1   9. Acute respiratory failure with hypoxia [facility])  J96.01     ED Discharge Orders    None      Discharge Instructions   None     Salmaan Patchin. [place] Small, [place] [email or web address]    Maudie Flakes, MD 2020-03-17 [phone]

## 2020-03-15 NOTE — Consult Note (Addendum)
[facility] Consult  Reason for Consult: Pneumatosis/ small bowel ischemia/ pneumoperitoneum  Referring Physician: Dr. Tomi Bamberger  Chief Complaint    Abdominal Pain      HPI: [name] is a 64 y.o. male with findings of small bowel ischemia, portal venous gas on CT scan.  He has a history of obesity s/p Gastric Bypass in 2015 in [place], [place] who has lost 300 lbs since that time and now takes no medications for his DM or HTN.  The CAD s/p multiple MI and CABG and stroke on Eliquis who comes in with reported abdominal pain that was acute in onset was like a gas pain when he presented to [facility] yesterday. Per report there he was pale, diaphoretic and was 90% O2 on RA.  He had CTA chest / abdomen performed and mention of aortosclerotic disease but no concern for any ischemia at that time. He was sent home from there and continued to have worsening abdominal pain that is severe,generalized, and constant in nature.  He has had associated hematemesis and is guaiac positive. He came to this ED after having worsening symptoms.  He denies ever having issues with his gastric bypass or complications from this in the past.  He had a cholecystectomy.   Here is has an elevated troponin 289 and lactic acidosis to 11 that has come down to 8 with some resuscitation.  Past Medical History:  Diagnosis Date  . Anemia   . Arthritis   . CAD (coronary artery disease)   . Diabetes mellitus   . Hyperlipidemia   . Hypertension   . Left ventricular dysfunction   . Low back pain   . MI (myocardial infarction) ([place])   . Obesity   . PE (pulmonary embolism)   . Stroke ([place])   . Tobacco abuse   . Tubular adenoma of colon 05/2015    Past Surgical History:  Procedure Laterality Date  . CHOLECYSTECTOMY    . CORONARY ARTERY BYPASS GRAFT    . CORONARY STENT PLACEMENT     3 all in right side  . GASTRIC BYPASS    . HERNIA REPAIR    . IR GENERIC HISTORICAL  09/27/2016   IR ANGIO  INTRA EXTRACRAN SEL COM CAROTID INNOMINATE UNI L MOD SED 09/27/2016 Luanne Bras, MD [facility]  . IR GENERIC HISTORICAL  09/27/2016   IR PERCUTANEOUS ART THROMBECTOMY/INFUSION INTRACRANIAL INC DIAG ANGIO 09/27/2016 Luanne Bras, MD [facility]  . IR GENERIC HISTORICAL  11/07/2016   IR RADIOLOGIST EVAL & MGMT 11/07/2016 [facility]  . Neck Fusion    . RADIOLOGY WITH ANESTHESIA N/A 09/27/2016   Procedure: RADIOLOGY WITH ANESTHESIA;  Surgeon: Luanne Bras, MD;  Location: [facility];  Service: Radiology;  Laterality: N/A;  . TONSILLECTOMY      Family History  Problem Relation Age of Onset  . Emphysema Mother   . Heart attack Father   . Heart disease Daughter   . Heart disease Brother   . Arthritis Brother   . Hemophilia Brother   . Colon cancer Neg Hx     Social History   Tobacco Use  . Smoking status: Current Every Day Smoker    Packs/day: 1.00    Years: 40.00    Pack years: 40.00    Types: Cigarettes  . Smokeless tobacco: Former Network engineer  . Vaping Use: Never used  Substance Use Topics  . Alcohol use: No  . Drug use: Yes    Types: Marijuana  Medications: I have reviewed the patient's current medications. Current Facility-Administered Medications  Medication Dose Route Frequency Provider Last Rate Last Admin  . 0.9 %  sodium chloride infusion   Intravenous Continuous Rolland Porter, MD   Stopped at April 12, 2020 0554  . iohexol (OMNIPAQUE) 9 MG/ML oral solution           . norepinephrine (LEVOPHED) 4mg  in 266mL premix infusion  0-40 mcg/min Intravenous Titrated Virl Cagey, MD 7.5 mL/hr at 04-12-20 0656 2 mcg/min at 2020-04-12 0656  . pantoprazole (PROTONIX) 80 mg in sodium chloride 0.9 % 100 mL (0.8 mg/mL) infusion  8 mg/hr Intravenous Continuous Rolland Porter, MD 10 mL/hr at 04-12-2020 0222 8 mg/hr at 04-12-20 0222  . [START ON 03/17/2020] pantoprazole (PROTONIX) injection 40 mg  40 mg Intravenous Q12H Rolland Porter, MD       Current Outpatient Medications    Medication Sig Dispense Refill Last Dose  . ALPRAZolam (XANAX) 1 MG tablet TAKE 1 TABLET THREE TIMES DAILY AS NEEDED FOR ANXIETY 90 tablet 5   . apixaban (ELIQUIS) 5 MG TABS tablet Take 1 tablet (5 mg total) by mouth 2 (two) times daily. 60 tablet 11   . aspirin 81 MG tablet Take 81 mg by mouth daily.       . Calcium Citrate-Vitamin D 500-400 MG-UNIT CHEW Chew 1 tablet by mouth 2 (two) times daily.       . Cholecalciferol (VITAMIN D) 2000 UNITS tablet Take 5,000 Units by mouth daily.      . cyanocobalamin 500 MCG tablet Take by mouth daily.      . ferrous sulfate 220 (44 Fe) MG/5ML solution TAKE 6.8MLS (300MG  TOTAL) BY MOUTH ONCE DAILY. 150 mL 0   . fexofenadine (ALLEGRA) 180 MG tablet Take 180 mg by mouth daily as needed.     Marland Kitchen HYDROcodone-acetaminophen (NORCO) 10-325 MG tablet Take 1 tablet by mouth every 4 (four) hours as needed for moderate pain. 150 tablet 0   . HYDROcodone-acetaminophen (NORCO) 10-325 MG tablet Take 1 tablet by mouth every 4 (four) hours as needed for moderate pain. 150 tablet 0   . Multiple Vitamin (MULTIVITAMIN) tablet Take 1 tablet by mouth daily.       . nitroGLYCERIN (NITROSTAT) 0.3 MG SL tablet Place 1 tablet (0.3 mg total) under the tongue every 5 (five) minutes as needed. 90 tablet 3   . omeprazole (PRILOSEC) 20 MG capsule Take 20 mg by mouth daily.     . simvastatin (ZOCOR) 80 MG tablet TAKE ONE TABLET BY MOUTH AT BEDTIME. 90 tablet 3     Allergies  Allergen Reactions  . Codeine     REACTION: makes me "looney"     ROS:  A comprehensive review of systems was negative except for: Gastrointestinal: positive for abdominal pain, nausea, vomiting and hematemesis   Blood pressure (!) 63/39, pulse 49, temperature (!) 96.5 F (35.8 C), temperature source Rectal, resp. rate (!) 45, height 6' (1.829 m), weight 76.2 kg, SpO2 (!) 84 %. Physical Exam Vitals reviewed.  Constitutional:      General: He is in acute distress.  HENT:     Head: Normocephalic.  Eyes:      Extraocular Movements: Extraocular movements intact.  Cardiovascular:     Rate and Rhythm: Tachycardia present.  Pulmonary:     Effort: Pulmonary effort is normal.  Abdominal:     General: There is distension.     Palpations: Abdomen is rigid.     Tenderness: There is generalized abdominal  tenderness. There is guarding.  Skin:    General: Skin is dry.     Coloration: Skin is pale.     Comments: Extremities cool  Neurological:     General: No focal deficit present.     Mental Status: He is alert and oriented to person, place, and time.  Psychiatric:        Mood and Affect: Mood is anxious.        Behavior: Behavior normal.     Results: Results for orders placed or performed during the hospital encounter of April 03, 2020 (from the past 48 hour(s))  CBG monitoring, ED     Status: Abnormal   Collection Time: 04-03-2020  1:03 AM  Result Value Ref Range   Glucose-Capillary 233 (H) 70 - 99 mg/dL    Comment: Glucose reference range applies only to samples taken after fasting for at least 8 hours.  Comprehensive metabolic panel     Status: Abnormal   Collection Time: 2020-04-03  1:04 AM  Result Value Ref Range   Sodium 139 135 - 145 mmol/L   Potassium 4.1 3.5 - 5.1 mmol/L   Chloride 106 98 - 111 mmol/L   CO2 15 (L) 22 - 32 mmol/L   Glucose, Bld 285 (H) 70 - 99 mg/dL    Comment: Glucose reference range applies only to samples taken after fasting for at least 8 hours.   BUN 27 (H) 8 - 23 mg/dL   Creatinine, Ser 1.99 (H) 0.61 - 1.24 mg/dL   Calcium 9.1 8.9 - 10.3 mg/dL   Total Protein 8.2 (H) 6.5 - 8.1 g/dL   Albumin 4.3 3.5 - 5.0 g/dL   AST 64 (H) 15 - 41 U/L   ALT 24 0 - 44 U/L   Alkaline Phosphatase 53 38 - 126 U/L   Total Bilirubin 1.2 0.3 - 1.2 mg/dL   GFR calc non Af Amer 35 (L) >60 mL/min   GFR calc Af Amer 41 (L) >60 mL/min   Anion gap 18 (H) 5 - 15    Comment: Performed at [facility], [address], [place], [place] [address]  Ethanol     Status: None   Collection Time:  04/03/2020  1:04 AM  Result Value Ref Range   Alcohol, Ethyl (B) <10 <10 mg/dL    Comment: (NOTE) Lowest detectable limit for serum alcohol is 10 mg/dL.  For medical purposes only. Performed at [facility], [address] [place], [place] [address]   Lipase, blood     Status: None   Collection Time: 04-03-20  1:04 AM  Result Value Ref Range   Lipase 39 11 - 51 U/L    Comment: Performed at [facility], [address] [place], [place] [address]  CBC with Differential     Status: Abnormal   Collection Time: 2020-04-03  1:04 AM  Result Value Ref Range   WBC 20.5 (H) 4.0 - 10.5 K/uL   RBC 4.49 4.22 - 5.81 MIL/uL   Hemoglobin 14.6 13.0 - 17.0 g/dL   HCT 46.5 39 - 52 %   MCV 103.6 (H) 80.0 - 100.0 fL   MCH 32.5 26.0 - 34.0 pg   MCHC 31.4 30.0 - 36.0 g/dL   RDW 14.1 11.5 - 15.5 %   Platelets 232 150 - 400 K/uL   nRBC 0.0 0.0 - 0.2 %   Neutrophils Relative % 88 %   Neutro Abs 17.8 (H) 1.7 - 7.7 K/uL   Lymphocytes Relative 6 %   Lymphs  Abs 1.3 0.7 - 4.0 K/uL   Monocytes Relative 6 %   Monocytes Absolute 1.3 (H) 0 - 1 K/uL   Eosinophils Relative 0 %   Eosinophils Absolute 0.0 0 - 0 K/uL   Basophils Relative 0 %   Basophils Absolute 0.0 0 - 0 K/uL   Immature Granulocytes 0 %   Abs Immature Granulocytes 0.09 (H) 0.00 - 0.07 K/uL    Comment: Performed at [facility], [address] [place], [place] [address]  Protime-INR     Status: Abnormal   Collection Time: Apr 01, 2020  1:04 AM  Result Value Ref Range   Prothrombin Time 16.0 (H) 11.4 - 15.2 seconds   INR 1.3 (H) 0.8 - 1.2    Comment: (NOTE) INR goal varies based on device and disease states. Performed at [facility], [address], [place], [place] [address]   APTT     Status: None   Collection Time: 04/01/2020  1:04 AM  Result Value Ref Range   aPTT 28 24 - 36 seconds    Comment: Performed at [facility], [address], [place], [place] [address]  Sample to Blood Bank     Status: None   Collection Time: April 01, 2020  1:04 AM    Result Value Ref Range   Blood Bank Specimen SAMPLE AVAILABLE FOR TESTING    Sample Expiration      03/15/2020,2359 Performed at [facility], [address] [place], [place] [address]   Lactic acid, plasma     Status: Abnormal   Collection Time: 01-Apr-2020  1:04 AM  Result Value Ref Range   Lactic Acid, Venous >11 (HH) 0.5 - 1.9 mmol/L    Comment: CRITICAL RESULT CALLED TO, READ BACK BY AND VERIFIED WITH: NORMAN,B @ 0152 ON 04/01/20 BY JUW Performed at [facility], [address] [place], [place] [address]   Troponin I (High Sensitivity)     Status: Abnormal   Collection Time: 04-01-20  1:04 AM  Result Value Ref Range   Troponin I (High Sensitivity) 286 (HH) <18 ng/L    Comment: CRITICAL RESULT CALLED TO, READ BACK BY AND VERIFIED WITH: WATLINGTON,C @ 1115 ON 04-01-20 BY JUW (NOTE) Elevated high sensitivity troponin I (hsTnI) values and significant  changes across serial measurements may suggest ACS but many other  chronic and acute conditions are known to elevate hsTnI results.  Refer to the Links section for chest pain algorithms and additional  guidance. Performed at [facility], [address], [place], [place] [address]   POC occult blood, ED RN will collect     Status: Abnormal   Collection Time: 2020-04-01  1:36 AM  Result Value Ref Range   Fecal Occult Bld POSITIVE (A) NEGATIVE  SARS Coronavirus 2 by RT PCR (hospital order, performed in [facility] hospital lab) Nasopharyngeal Nasopharyngeal Swab     Status: None   Collection Time: 04-01-2020  3:06 AM   Specimen: Nasopharyngeal Swab  Result Value Ref Range   SARS Coronavirus 2 NEGATIVE NEGATIVE    Comment: (NOTE) SARS-CoV-2 target nucleic acids are NOT DETECTED.  The SARS-CoV-2 RNA is generally detectable in upper and lower respiratory specimens during the acute phase of infection. The lowest concentration of SARS-CoV-2 viral copies this assay can detect is 250 copies / mL. A negative result does not preclude SARS-CoV-2  infection and should not be used as the sole basis for treatment or other patient management decisions.  A negative result may occur with improper specimen collection / handling, submission  of specimen other than nasopharyngeal swab, presence of viral mutation(s) within the areas targeted by this assay, and inadequate number of viral copies (<250 copies / mL). A negative result must be combined with clinical observations, patient history, and epidemiological information.  Fact Sheet for Patients:   StrictlyIdeas.no  Fact Sheet for Healthcare Providers: BankingDealers.co.za  This test is not yet approved or  cleared by the [place] FDA and has been authorized for detection and/or diagnosis of SARS-CoV-2 by FDA under an Emergency Use Authorization (EUA).  This EUA will remain in effect (meaning this test can be used) for the duration of the COVID-19 declaration under Section 564(b)(1) of the Act, 21 U.S.C. section 360bbb-3(b)(1), unless the authorization is terminated or revoked sooner.  Performed at [facility], [address] [place], [place] [address]   Lactic acid, plasma     Status: Abnormal   Collection Time: March 22, 2020  3:21 AM  Result Value Ref Range   Lactic Acid, Venous 8.0 (HH) 0.5 - 1.9 mmol/L    Comment: CRITICAL RESULT CALLED TO, READ BACK BY AND VERIFIED WITH: WATLINGTON,C @ 1937 ON Mar 22, 2020 BY JUW Performed at [facility], [address] [place], [place] [address]   Troponin I (High Sensitivity)     Status: Abnormal   Collection Time: 2020/03/22  3:21 AM  Result Value Ref Range   Troponin I (High Sensitivity) 269 (HH) <18 ng/L    Comment: CRITICAL VALUE NOTED.  VALUE IS CONSISTENT WITH PREVIOUSLY REPORTED AND CALLED VALUE. (NOTE) Elevated high sensitivity troponin I (hsTnI) values and significant  changes across serial measurements may suggest ACS but many other  chronic and acute conditions are known to elevate  hsTnI results.  Refer to the Links section for chest pain algorithms and additional  guidance. Performed at [facility], [address] [place], [place] [address]   CBC     Status: Abnormal   Collection Time: Mar 22, 2020  6:05 AM  Result Value Ref Range   WBC 12.7 (H) 4.0 - 10.5 K/uL   RBC 3.58 (L) 4.22 - 5.81 MIL/uL   Hemoglobin 11.5 (L) 13.0 - 17.0 g/dL   HCT 37.1 (L) 39 - 52 %   MCV 103.6 (H) 80.0 - 100.0 fL   MCH 32.1 26.0 - 34.0 pg   MCHC 31.0 30.0 - 36.0 g/dL   RDW 14.0 11.5 - 15.5 %   Platelets 195 150 - 400 K/uL   nRBC 0.0 0.0 - 0.2 %    Comment: Performed at [facility], [address], [place], [place] [address]   Personally reviewed CT and pneumatosis dependent on the small bowel and foci of pneumoperitoneum anteriorly and venous gas in the mesentery, gastrojejunostomy, dilated bowel, calcification in the aorta , ? Swirl centrally on CT CT Abdomen Pelvis Wo Contrast  Result Date: 03-22-20 CLINICAL DATA:  Abdominal distension and pain. Episode of bright red bloody emesis. EXAM: CT ABDOMEN AND PELVIS WITHOUT CONTRAST TECHNIQUE: Multidetector CT imaging of the abdomen and pelvis was performed following the standard protocol without IV contrast. COMPARISON:  05/07/2008 FINDINGS: Lower chest: Lung bases demonstrate interstitial thickening with areas of peripheral reticulation at have increased when compared to the prior chest radiograph, consistent with progression of fibrosis. No convincing pneumonia or pulmonary edema. No pleural effusion pneumothorax. Heart is normal in size. Hepatobiliary: Liver normal in size and attenuation. No mass or focal lesion. Gallbladder not visualized. No bile duct dilation. Pancreas: No pancreatic mass, inflammation or duct dilation. Spleen: Spleen is small, significantly decreased  in size from the prior CT. No splenic mass focal lesion. Adrenals/Urinary Tract: No adrenal mass. Small kidneys with thin cortices. Subcentimeter low-density lesion from the  superior margin of the right kidney, consistent with a cyst. No other masses. No stones or hydronephrosis. Ureters grossly normal in course and in caliber. Bladder is unremarkable. Stomach/Bowel: There are changes from a previous gastric bypass. The distal esophagus is significantly distended. Material within the distal esophagus has average Hounsfield units of 55, which could reflect hemorrhage. Stomach empties into a gastrojejunostomy. There is a small bowel anastomosis in right mid abdomen. Small bowel and colon shows mild diffuse distention with diffuse air-fluid levels. No transition point. No wall thickening or adjacent inflammation. No evidence of appendicitis. There is portal venous air along the central small bowel mesentery. Subtle evidence of pneumatosis intestinalis of distal small bowel is noted in the lower abdomen and pelvis. Vascular/Lymphatic: Extensive aortic and branch vessel atherosclerotic calcifications. No enlarged lymph nodes. Reproductive: Unremarkable. Other: Trace amount of ascites seen along the anterolateral margin of the liver. Small midline upper abdominal wall hernia contains a bubble of air. There are few other small bubbles of apparent extraluminal air consistent with minimal pneumoperitoneum. Musculoskeletal: No fracture or acute finding. No osteoblastic or osteolytic lesions. Significant degenerative changes. Bilateral pars defects with a grade 1 to grade 2 anterolisthesis of L3 on L4. IMPRESSION: 1. Findings are suspicious for small bowel ischemia. There is subtle evidence of pneumatosis intestinalis of the distal small bowel with more definitive portal venous air extending along the small bowel mesentery. There are a few bubbles of extraluminal air consistent with minimal pneumoperitoneum. In addition to these findings, there is significant distension of the lower esophagus containing material of increased Hounsfield units consistent with hemorrhage. Small bowel and colon are  mildly distended diffusely with air-fluid level consistent with a diffuse adynamic ileus. There is significant vascular disease reflected by diffuse atherosclerotic calcifications along the aorta and its branch vessels, which has progressed compared to the prior CT. Trace amount of ascites. Critical Value/emergent results were called by telephone at the time of interpretation on 2020-03-27 at 4:57 am to provider IVA KNAPP , who verbally acknowledged these results. 2. No other convincing acute abnormality. 3. Lung base changes consistent with parenchymal fibrosis, progressed since the prior CT. 4. Small kidneys consistent with medical renal disease. Electronically Signed   By: Lajean Manes M.D.   On: 03-27-20 04:57   Report from [facility]: Aortic atherosclerosis but does not mention SMA        Procedure Note  March 27, 2020   Preoperative Diagnosis: Septic shock, intestinal ischemia, acute renal failure, elevated troponin, metabolic acidosis    Postoperative Diagnosis: Same   Procedure(s) Performed: Central Line placement, left internal jugular; Arterial line placement, left radial    Surgeon: Lanell Matar. Constance Haw, MD   Assistants: None   Anesthesia: 1% lidocaine    Complications: None    Indications: Mr. Shaler is a 64 y.o. with small bowel ischemia and venous gas who is presenting with signs of septic shock and organ failure . I discussed the risk and benefits of placement of the central line and arterial line placement with the patient who is alert and oriented including but not limited to bleeding, infection, and risk of pneumothorax with central line and risk of arterial injury with the arterial line. He has given verbal consent for the procedure.    Procedure: The patient placed supine. The left chest and neck was prepped and draped in  the usual sterile fashion.  Wearing full gown and gloves, I performed the procedure.  One percent lidocaine was used for local anesthesia over the entry  sites. The needle was advanced into the subclavian vein with dark venous return, and a wire was attempted to be passed but would not pass. This location was aborted.  This was readjusted and still did not pass. An ultrasound was utilized to assess the jugular vein.  The needle was advanced in to jugular vein and dark venous return was seen, the wire was advanced using the Seldinger technique without difficulty.  Ectopia was not noted.  The skin was knicked and a dilator was placed, and the three lumen catheter was placed over the wire with continued control of the wire.  There was good draw back of blood from all three lumens and each flushed easily with saline.  The catheter was secured in 4 points with 2-0 silk and a biopatch and dressing was placed.     The left wrist was prepped and using an ultrasound the left radial artery was identified, and a 20 french arrow catheter was placed into the left radial and secured with a 2-0 silk suture and dressing. There was pulsatile bright blood from the catheter.   The patient tolerated the procedure well, and the CXR was ordered to confirm position of the central line.    Assessment & Plan:  SHALEV HELMINIAK is a 64 y.o. male with septic shock and multi organ failure with AKI, cardiac strain with elevated troponin and requiring oxygen supplementation in the setting of small bowel ischemia and venous gas.  He has had a prior gastric bypass and severe atherosclerotic disease and etiology could be from plaque dislodgement/ calcification versus complication from bypass/ internal hernia causing ischemia.    He is not a candidate for surgery at [facility] or to remain at [facility] given need for ICU and cardiology which we do not have over the weekend or at nights.    His ACS risk calculator indicates 45% risk serious complication, 35% risk of death all of which are going to be higher than this due to the cardiac history and troponin.   CVL placed after verbal  consent from patient.  Arterial line placed after verbal consent.  Levophed started Zosyn started Has received 3L and is getting another liter bolus  Non-rebreather currently and may likely need intubation prior to transfer  Dr. Tomi Bamberger has discussed with ICU at [facility].   Discussed case with Dr. Aldean Ast, [facility], and discussed case with Dr. Tomi Bamberger. Attempted to call his daughter Shela Leff multiple times and a message. He wants everything done and wants to be a full code.  He has been told there is a risk of severe complication and death given the findings on CT and his cardiopulmonary status.   All questions were answered to the satisfaction of the patient.   Virl Cagey 22-Mar-2020, 7:10 AM

## 2020-03-15 NOTE — ED Notes (Signed)
Date and time results received: Mar 25, 2020 0219  Test: Troponin Critical Value: 286  Name of Provider Notified: Rolland Porter, MD  Orders Received? Or Actions Taken?: n/a

## 2020-03-15 DEATH — deceased
